# Patient Record
Sex: Female | Born: 1980 | Race: White | Hispanic: No | Marital: Married
Health system: Southern US, Community
[De-identification: ages and names within clinical notes are randomized; demographics above are authoritative.]

## PROBLEM LIST (undated history)

## (undated) DIAGNOSIS — I10 Essential (primary) hypertension: Secondary | ICD-10-CM

## (undated) DIAGNOSIS — R51 Headache: Secondary | ICD-10-CM

## (undated) DIAGNOSIS — N471 Phimosis: Secondary | ICD-10-CM

## (undated) DIAGNOSIS — F419 Anxiety disorder, unspecified: Secondary | ICD-10-CM

## (undated) DIAGNOSIS — Z8759 Personal history of other complications of pregnancy, childbirth and the puerperium: Secondary | ICD-10-CM

## (undated) DIAGNOSIS — G932 Benign intracranial hypertension: Secondary | ICD-10-CM

## (undated) DIAGNOSIS — N979 Female infertility, unspecified: Secondary | ICD-10-CM

## (undated) DIAGNOSIS — G8929 Other chronic pain: Secondary | ICD-10-CM

## (undated) DIAGNOSIS — F17200 Nicotine dependence, unspecified, uncomplicated: Secondary | ICD-10-CM

## (undated) DIAGNOSIS — R519 Headache, unspecified: Secondary | ICD-10-CM

## (undated) DIAGNOSIS — F32A Depression, unspecified: Secondary | ICD-10-CM

## (undated) DIAGNOSIS — Z87442 Personal history of urinary calculi: Secondary | ICD-10-CM

## (undated) DIAGNOSIS — N838 Other noninflammatory disorders of ovary, fallopian tube and broad ligament: Secondary | ICD-10-CM

## (undated) DIAGNOSIS — G935 Compression of brain: Secondary | ICD-10-CM

## (undated) DIAGNOSIS — N736 Female pelvic peritoneal adhesions (postinfective): Secondary | ICD-10-CM

## (undated) DIAGNOSIS — Z8742 Personal history of other diseases of the female genital tract: Secondary | ICD-10-CM

## (undated) DIAGNOSIS — N848 Polyp of other parts of female genital tract: Secondary | ICD-10-CM

## (undated) DIAGNOSIS — N809 Endometriosis, unspecified: Secondary | ICD-10-CM

## (undated) HISTORY — PX: OTHER SURGICAL HISTORY: SHX169

## (undated) HISTORY — PX: SPINAL PUNCTURE LUMBAR DIAG (ARMC HX): HXRAD1265

## (undated) HISTORY — PX: LAPAROSCOPY: SHX197

## (undated) HISTORY — PX: TONSILLECTOMY: SUR1361

## (undated) HISTORY — PX: CHOLECYSTECTOMY: SHX55

## (undated) HISTORY — PX: HYSTEROSCOPY: SHX211

---
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
## 2004-12-04 ENCOUNTER — Emergency Department: Payer: Self-pay | Admitting: Emergency Medicine

## 2004-12-16 ENCOUNTER — Ambulatory Visit: Payer: Self-pay | Admitting: Unknown Physician Specialty

## 2005-07-12 ENCOUNTER — Observation Stay: Payer: Self-pay | Admitting: Unknown Physician Specialty

## 2005-08-03 ENCOUNTER — Inpatient Hospital Stay: Payer: Self-pay

## 2007-04-21 ENCOUNTER — Emergency Department: Payer: Self-pay | Admitting: Unknown Physician Specialty

## 2007-05-11 ENCOUNTER — Ambulatory Visit: Payer: Self-pay | Admitting: Urology

## 2007-06-11 ENCOUNTER — Ambulatory Visit: Payer: Self-pay | Admitting: Urology

## 2007-09-30 ENCOUNTER — Emergency Department (HOSPITAL_COMMUNITY): Admission: EM | Admit: 2007-09-30 | Discharge: 2007-09-30 | Payer: Self-pay | Admitting: Emergency Medicine

## 2007-10-01 ENCOUNTER — Ambulatory Visit: Payer: Self-pay | Admitting: Urology

## 2009-03-06 IMAGING — CT CT STONE STUDY
1 of 2 series · 16 of 32 positions shown, 20 images · non-contrast
Comparison: none

REASON FOR EXAM: hematuria
COMMENTS:

PROCEDURE:     CT  - CT ABDOMEN /PELVIS WO (STONE)  - April 21, 2007  [DATE]
RESULT:
HISTORY: Hematuria.

[Series 2: stone · axial · 0.60mm/px · z∈[-466,-67]mm · 16 of 145 slices shown, 20 images]
[im 6/145  soft-tissue]
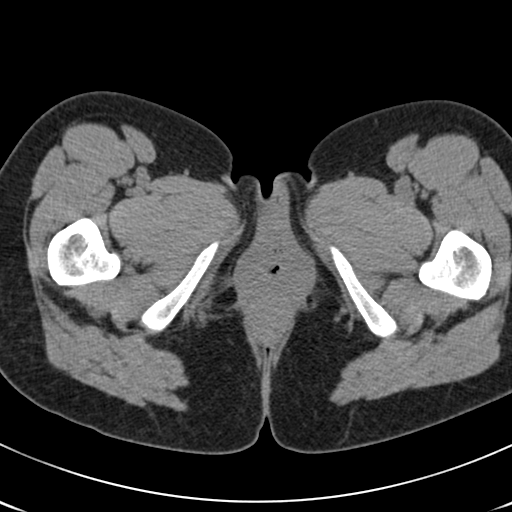
[im 6/145  bone]
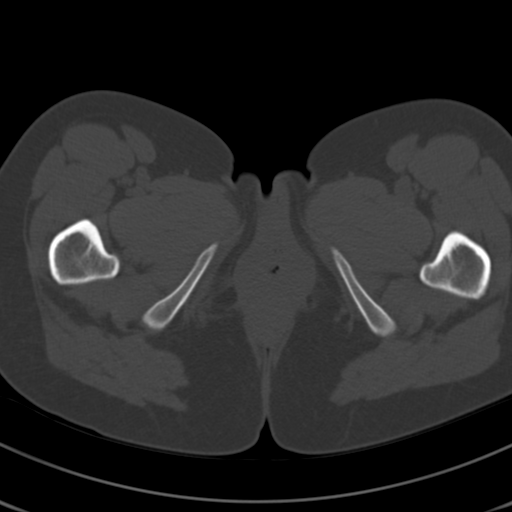
[im 16/145  soft-tissue]
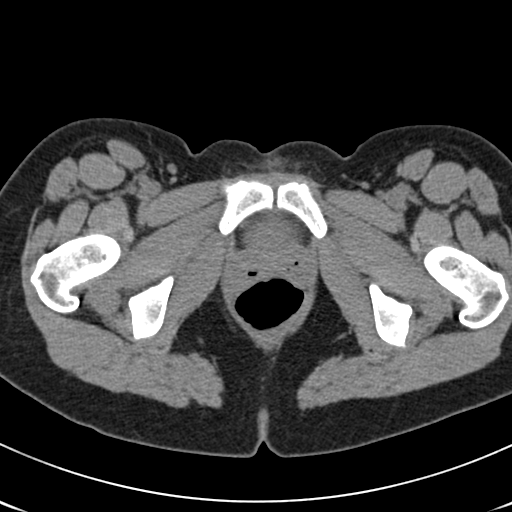
[im 26/145  soft-tissue]
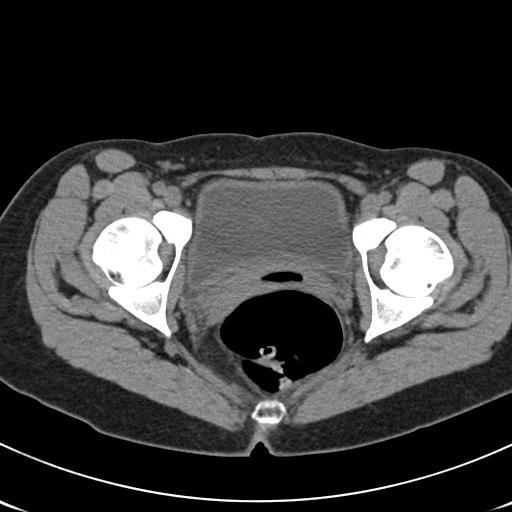
[im 37/145  soft-tissue]
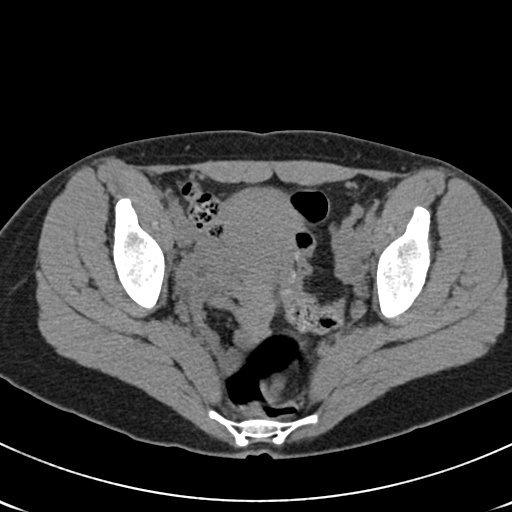
[im 47/145  soft-tissue]
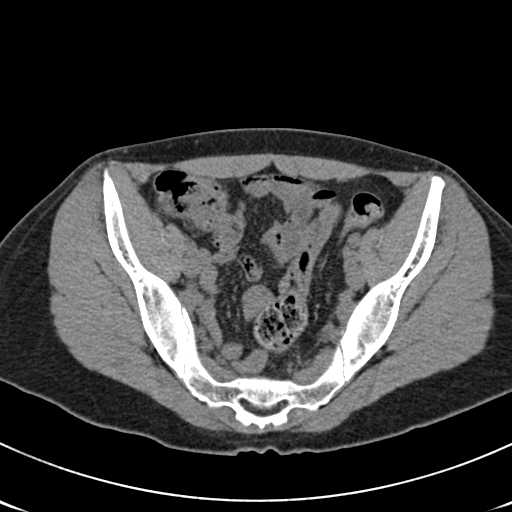
[im 57/145  soft-tissue]
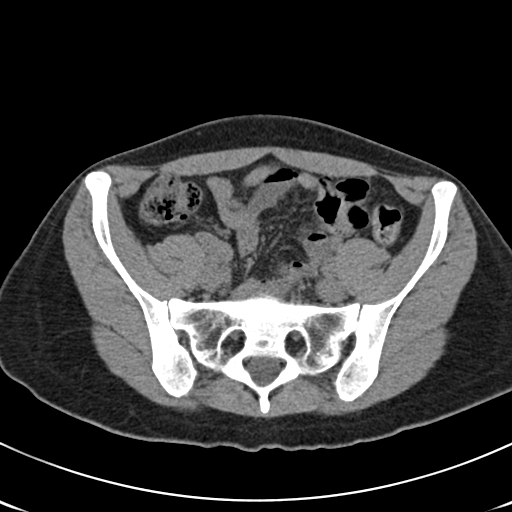
[im 67/145  soft-tissue]
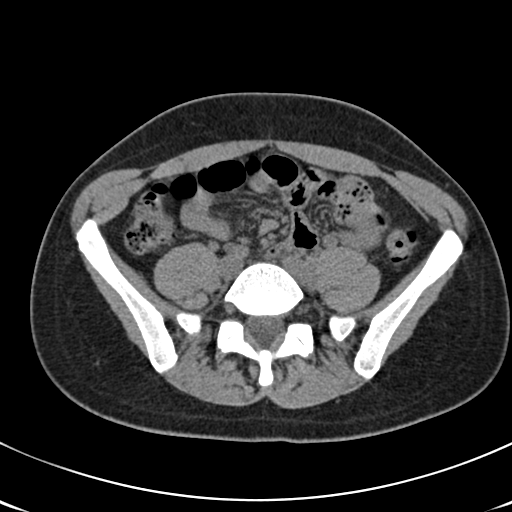
[im 78/145  soft-tissue]
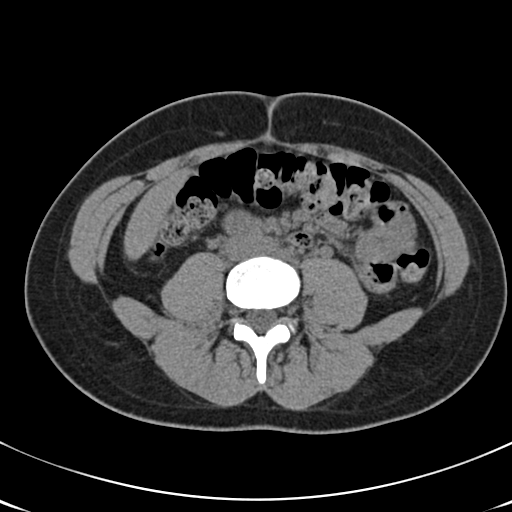
[im 88/145  soft-tissue]
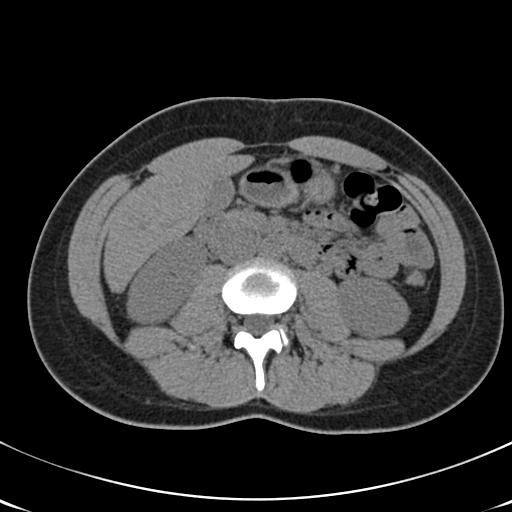
[im 88/145  bone]
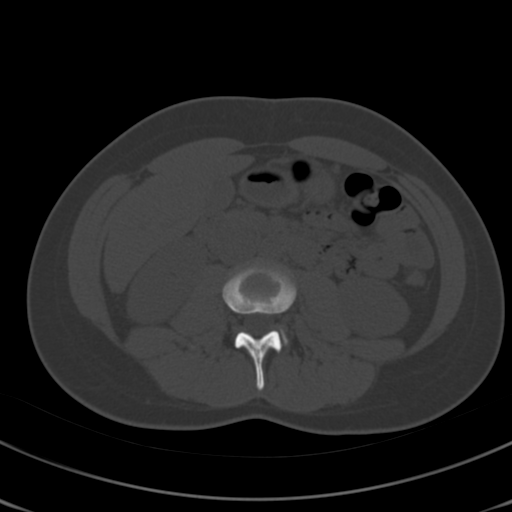
[im 98/145  soft-tissue]
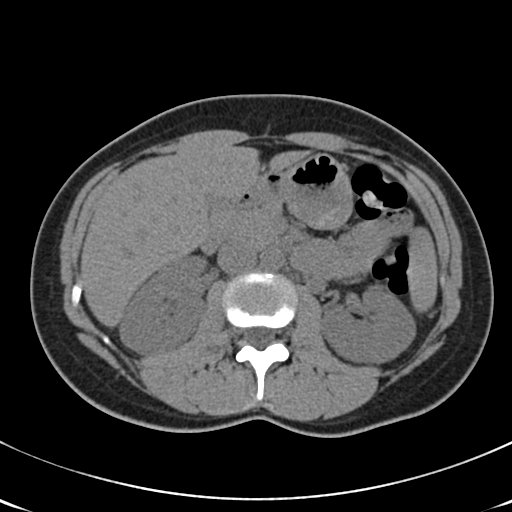
[im 109/145  soft-tissue]
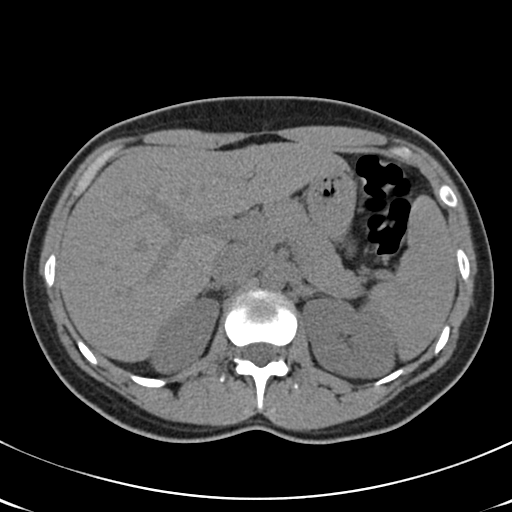
[im 119/145  soft-tissue]
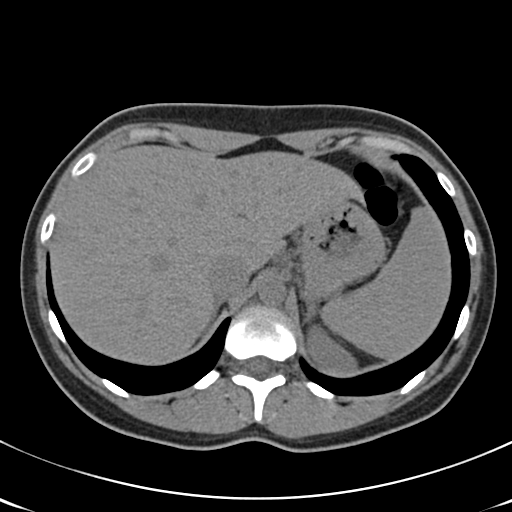
[im 124/145  lung]
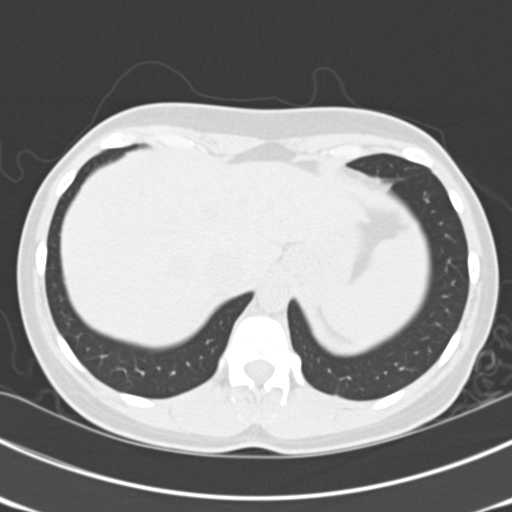
[im 129/145  soft-tissue]
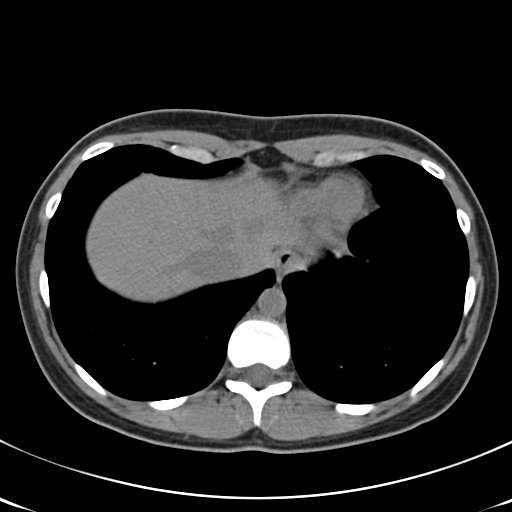
[im 129/145  lung]
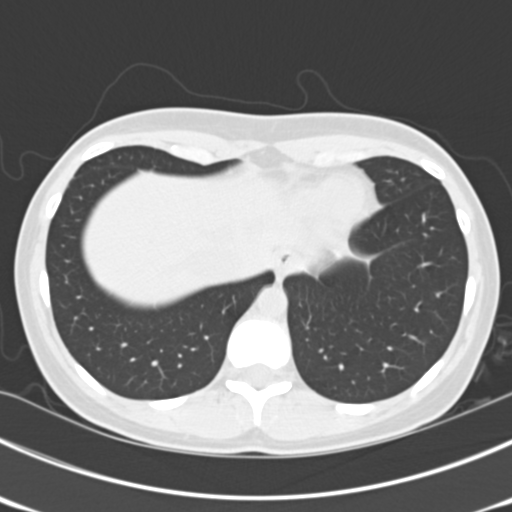
[im 134/145  lung]
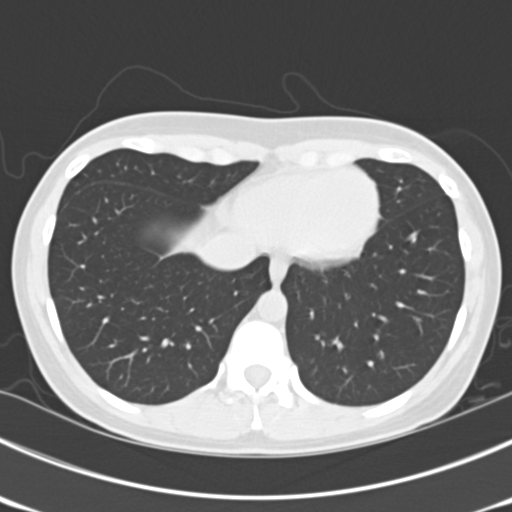
[im 139/145  soft-tissue]
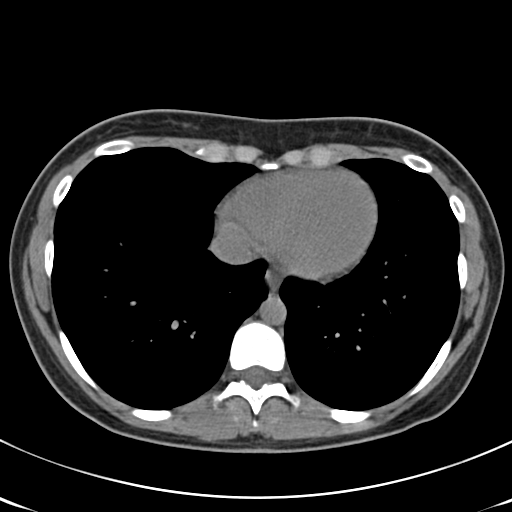
[im 139/145  lung]
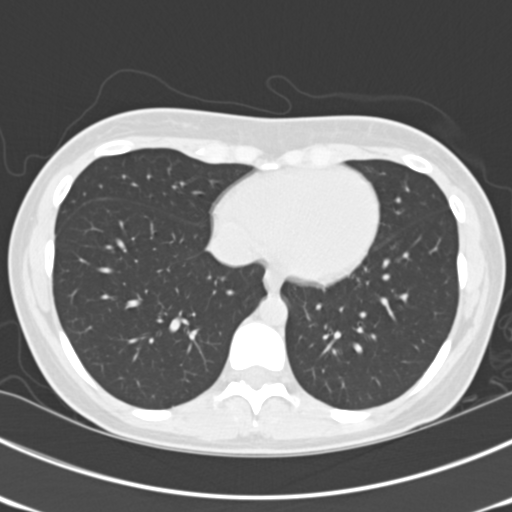

[16 of 32 positions shown; findings below may reference images not displayed]

COMPARISON STUDIES: No recent.

PROCEDURE AND FINDINGS: Non-enhanced CT of the abdomen and pelvis is
obtained. The liver and spleen are normal.  The pancreas is normal. The
adrenals are normal.  No focal renal abnormalities are identified. There is
no bowel distention.  An intrauterine device is noted.  The bladder is
non-distended.  There is a tiny 3 to 4 mm calcification in the LEFT pelvis.
This could represent a very tiny distal LEFT ureteral stone.  There is no
evidence of severe hydronephrosis.
IMPRESSION: 1)Cannot exclude a tiny 3 mm distal LEFT ureteral stone.

## 2009-06-19 ENCOUNTER — Emergency Department: Payer: Self-pay | Admitting: Emergency Medicine

## 2011-05-06 LAB — URINALYSIS, ROUTINE W REFLEX MICROSCOPIC
Nitrite: POSITIVE — AB
Specific Gravity, Urine: 1.005
Urobilinogen, UA: 0.2

## 2011-05-06 LAB — URINE MICROSCOPIC-ADD ON

## 2011-08-16 HISTORY — PX: CRANIECTOMY SUBOCCIPITAL W/ CERVICAL LAMINECTOMY / CHIARI: SUR327

## 2011-11-01 ENCOUNTER — Emergency Department: Payer: Self-pay | Admitting: Emergency Medicine

## 2011-11-08 ENCOUNTER — Ambulatory Visit: Payer: Self-pay | Admitting: Neurology

## 2011-12-26 ENCOUNTER — Ambulatory Visit: Payer: Self-pay | Admitting: Neurology

## 2011-12-26 LAB — HCG, QUANTITATIVE, PREGNANCY: Beta Hcg, Quant.: <1 m[IU]/mL — ABNORMAL LOW

## 2012-06-07 HISTORY — PX: CRANIECTOMY SUBOCCIPITAL W/ CERVICAL LAMINECTOMY / CHIARI: SUR327

## 2015-06-17 ENCOUNTER — Emergency Department
Admission: EM | Admit: 2015-06-17 | Discharge: 2015-06-17 | Disposition: A | Discharge status: Home / Self Care | Payer: Self-pay | Attending: Emergency Medicine | Admitting: Emergency Medicine

## 2015-06-17 ENCOUNTER — Encounter: Payer: Self-pay | Admitting: Emergency Medicine

## 2015-06-17 ENCOUNTER — Emergency Department: Payer: Self-pay

## 2015-06-17 DIAGNOSIS — R079 Chest pain, unspecified: Secondary | ICD-10-CM | POA: Insufficient documentation

## 2015-06-17 DIAGNOSIS — F419 Anxiety disorder, unspecified: Secondary | ICD-10-CM | POA: Insufficient documentation

## 2015-06-17 DIAGNOSIS — Z720 Tobacco use: Secondary | ICD-10-CM | POA: Insufficient documentation

## 2015-06-17 DIAGNOSIS — R0602 Shortness of breath: Secondary | ICD-10-CM | POA: Insufficient documentation

## 2015-06-17 LAB — BASIC METABOLIC PANEL
Anion gap: 6 (ref 5–15)
BUN: 7 mg/dL (ref 6–20)
CALCIUM: 9 mg/dL (ref 8.9–10.3)
CHLORIDE: 105 mmol/L (ref 101–111)
CO2: 25 mmol/L (ref 22–32)
CREATININE: 0.68 mg/dL (ref 0.44–1.00)
GFR calc non Af Amer: >60 mL/min (ref >60)
Glucose, Bld: 92 mg/dL (ref 65–99)
Potassium: 3.5 mmol/L (ref 3.5–5.1)
SODIUM: 136 mmol/L (ref 135–145)

## 2015-06-17 LAB — TROPONIN I

## 2015-06-17 LAB — CBC
HCT: 42.9 % (ref 35.0–47.0)
Hemoglobin: 14.7 g/dL (ref 12.0–16.0)
MCH: 31.9 pg (ref 26.0–34.0)
MCHC: 34.4 g/dL (ref 32.0–36.0)
MCV: 92.8 fL (ref 80.0–100.0)
PLATELETS: 210 10*3/uL (ref 150–440)
RBC: 4.62 MIL/uL (ref 3.80–5.20)
RDW: 12.9 % (ref 11.5–14.5)
WBC: 6.3 10*3/uL (ref 3.6–11.0)

## 2015-06-17 MED ORDER — LISINOPRIL 10 MG PO TABS: 10.0000 mg | ORAL_TABLET | Freq: Every day | ORAL | Status: DC

## 2015-06-17 MED ORDER — ACETAZOLAMIDE 125 MG PO TABS
250.0000 mg | ORAL_TABLET | Freq: Two times a day (BID) | ORAL | Status: DC
Start: 2015-06-17 — End: 2016-06-02

## 2015-06-17 MED ORDER — LORAZEPAM 0.5 MG PO TABS: 0.5000 mg | ORAL_TABLET | Freq: Three times a day (TID) | ORAL | Status: AC | PRN

## 2015-06-17 MED ORDER — POTASSIUM CHLORIDE ER 10 MEQ PO TBCR: 10.0000 meq | EXTENDED_RELEASE_TABLET | Freq: Two times a day (BID) | ORAL | Status: DC

## 2015-06-17 NOTE — ED Provider Notes (Signed)
[facility] Emergency Department Provider Note  Time seen: 1:18 PM  I have reviewed the triage vital signs and the nursing notes.   HISTORY  Chief Complaint Chest Pain    HPI Olivia Melendez is a 34 y.o. female with a past medical history of Chiari malformation, increased intracranial pressure, who presents to the emergency department with chest pain. According to the patient she has had chest pain on and off since this weekend, last episode was around 8:00 this morning. Denies any chest pain currently. She states the chest pain seems to happening during times of increased stress or anxiety. States some shortness of breath with her chest pain, denies any currently. Denies any nausea or diaphoresis. Describes the chest pain is mild to moderate at its maximum which she describes as a tightness feeling and she can feel her heart beating fast again her chest.Also states she is out of all of her prescription medications for the past 2 months due to insurance issues, but she has just received Medicaid 2 days ago.    History reviewed. No pertinent past medical history.  There are no active problems to display for this patient.   History reviewed. No pertinent past surgical history.  No current outpatient prescriptions on file.  Allergies Review of patient's allergies indicates no known allergies.  No family history on file.  Social History Social History  Substance Use Topics  . Smoking status: Current Every Day Smoker -- 1.00 packs/day  . Smokeless tobacco: None  . Alcohol Use: Yes     Comment: socially    Review of Systems Constitutional: Negative for fever. Cardiovascular: Positive for intermittent chest pain Respiratory: Negative for shortness of breath. Gastrointestinal: Negative for abdominal pain Musculoskeletal: Negative for back pain. Neurological: Negative for headache 10-point ROS otherwise  negative.  ____________________________________________   PHYSICAL EXAM:  VITAL SIGNS: ED Triage Vitals  Enc Vitals Group     BP 06/17/15 1016 154/99 mmHg     Pulse Rate 06/17/15 1016 89     Resp 06/17/15 1016 18     Temp 06/17/15 1016 98 F (36.7 C)     Temp Source 06/17/15 1016 Oral     SpO2 06/17/15 1016 100 %     Weight 06/17/15 1016 155 lb (70.308 kg)     Height 06/17/15 1016 5\' 4"  (1.626 m)     Head Cir --      Peak Flow --      Pain Score 06/17/15 1018 8     Pain Loc --      Pain Edu? --      Excl. in GC? --    Constitutional: Alert and oriented. Well appearing and in no distress. Eyes: Normal exam ENT   Head: Normocephalic and atraumatic   Mouth/Throat: Mucous membranes are moist. Cardiovascular: Normal rate, regular rhythm. No murmur Respiratory: Normal respiratory effort without tachypnea nor retractions. Breath sounds are clear and equal bilaterally. No wheezes/rales/rhonchi. Gastrointestinal: Soft and nontender. No distention.   Musculoskeletal: Nontender with normal range of motion in all extremities. Neurologic:  Normal speech and language. No gross focal neurologic deficits Skin:  Skin is warm, dry and intact.  Psychiatric: Mood and affect are normal. Speech and behavior are normal.   ____________________________________________    EKG  EKG reviewed and interpreted by myself shows normal sinus rhythm at 77 bpm, narrow QRS, normal axis, normal intervals, no ST changes noted. Overall normal EKG.  ____________________________________________    RADIOLOGY  Chest x-ray shows no  acute abnormalities.  ____________________________________________    INITIAL IMPRESSION / ASSESSMENT AND PLAN / ED COURSE  Pertinent labs & imaging results that were available during my care of the patient were reviewed by me and considered in my medical decision making (see chart for details).  Patient with intermittent chest pain for several days. States the  chest pain only occurs during times of stress/anxiety. Denies any symptoms currently. I discussed with patient a short trial of Ativan. She has a follow-up appointment with her primary care doctor tomorrow. I will also refill her lisinopril, Acetasol minute and potassium supplement. I discussed with the patient strict return precautions for worsening chest pain any trouble breathing, or new symptoms concerning to her self. She is agreeable.  ____________________________________________   FINAL CLINICAL IMPRESSION(S) / ED DIAGNOSES  Chest pain Anxiety   Minna Antis, MD 06/17/15 1321

## 2015-06-17 NOTE — Discharge Instructions (Signed)
You have been seen in the emergency department today for chest pain. Your workup has shown normal results. As we discussed please follow-up with your primary care physician in the next 1-2 days for recheck. Return to the emergency department for any further chest pain, trouble breathing, or any other symptom personally concerning to yourself.   Nonspecific Chest Pain  Chest pain can be caused by many different conditions. There is always a chance that your pain could be related to something serious, such as a heart attack or a blood clot in your lungs. Chest pain can also be caused by conditions that are not life-threatening. If you have chest pain, it is very important to follow up with your health care provider. CAUSES  Chest pain can be caused by:  Heartburn.  Pneumonia or bronchitis.  Anxiety or stress.  Inflammation around your heart (pericarditis) or lung (pleuritis or pleurisy).  A blood clot in your lung.  A collapsed lung (pneumothorax). It can develop suddenly on its own (spontaneous pneumothorax) or from trauma to the chest.  Shingles infection (varicella-zoster virus).  Heart attack.  Damage to the bones, muscles, and cartilage that make up your chest wall. This can include:  Bruised bones due to injury.  Strained muscles or cartilage due to frequent or repeated coughing or overwork.  Fracture to one or more ribs.  Sore cartilage due to inflammation (costochondritis). RISK FACTORS  Risk factors for chest pain may include:  Activities that increase your risk for trauma or injury to your chest.  Respiratory infections or conditions that cause frequent coughing.  Medical conditions or overeating that can cause heartburn.  Heart disease or family history of heart disease.  Conditions or health behaviors that increase your risk of developing a blood clot.  Having had chicken pox (varicella zoster). SIGNS AND SYMPTOMS Chest pain can feel like:  Burning or  tingling on the surface of your chest or deep in your chest.  Crushing, pressure, aching, or squeezing pain.  Dull or sharp pain that is worse when you move, cough, or take a deep breath.  Pain that is also felt in your back, neck, shoulder, or arm, or pain that spreads to any of these areas. Your chest pain may come and go, or it may stay constant. DIAGNOSIS Lab tests or other studies may be needed to find the cause of your pain. Your health care provider may have you take a test called an ambulatory ECG (electrocardiogram). An ECG records your heartbeat patterns at the time the test is performed. You may also have other tests, such as:  Transthoracic echocardiogram (TTE). During echocardiography, sound waves are used to create a picture of all of the heart structures and to look at how blood flows through your heart.  Transesophageal echocardiogram (TEE).This is a more advanced imaging test that obtains images from inside your body. It allows your health care provider to see your heart in finer detail.  Cardiac monitoring. This allows your health care provider to monitor your heart rate and rhythm in real time.  Holter monitor. This is a portable device that records your heartbeat and can help to diagnose abnormal heartbeats. It allows your health care provider to track your heart activity for several days, if needed.  Stress tests. These can be done through exercise or by taking medicine that makes your heart beat more quickly.  Blood tests.  Imaging tests. TREATMENT  Your treatment depends on what is causing your chest pain. Treatment may include:  Medicines. These may include: °¨ Acid blockers for heartburn. °¨ Anti-inflammatory medicine. °¨ Pain medicine for inflammatory conditions. °¨ Antibiotic medicine, if an infection is present. °¨ Medicines to dissolve blood clots. °¨ Medicines to treat coronary artery disease. °· Supportive care for conditions that do not require medicines.  This may include: °¨ Resting. °¨ Applying heat or cold packs to injured areas. °¨ Limiting activities until pain decreases. °HOME CARE INSTRUCTIONS °· If you were prescribed an antibiotic medicine, finish it all even if you start to feel better. °· Avoid any activities that bring on chest pain. °· Do not use any tobacco products, including cigarettes, chewing tobacco, or electronic cigarettes. If you need help quitting, ask your health care provider. °· Do not drink alcohol. °· Take medicines only as directed by your health care provider. °· Keep all follow-up visits as directed by your health care provider. This is important. This includes any further testing if your chest pain does not go away. °· If heartburn is the cause for your chest pain, you may be told to keep your head raised (elevated) while sleeping. This reduces the chance that acid will go from your stomach into your esophagus. °· Make lifestyle changes as directed by your health care provider. These may include: °¨ Getting regular exercise. Ask your health care provider to suggest some activities that are safe for you. °¨ Eating a heart-healthy diet. A registered dietitian can help you to learn healthy eating options. °¨ Maintaining a healthy weight. °¨ Managing diabetes, if necessary. °¨ Reducing stress. °SEEK MEDICAL CARE IF: °· Your chest pain does not go away after treatment. °· You have a rash with blisters on your chest. °· You have a fever. °SEEK IMMEDIATE MEDICAL CARE IF:  °· Your chest pain is worse. °· You have an increasing cough, or you cough up blood. °· You have severe abdominal pain. °· You have severe weakness. °· You faint. °· You have chills. °· You have sudden, unexplained chest discomfort. °· You have sudden, unexplained discomfort in your arms, back, neck, or jaw. °· You have shortness of breath at any time. °· You suddenly start to sweat, or your skin gets clammy. °· You feel nauseous or you vomit. °· You suddenly feel  light-headed or dizzy. °· Your heart begins to beat quickly, or it feels like it is skipping beats. °These symptoms may represent a serious problem that is an emergency. Do not wait to see if the symptoms will go away. Get medical help right away. Call your local emergency services (911 in the [place] Do not drive yourself to the hospital. °  °This information is not intended to replace advice given to you by your health care provider. Make sure you discuss any questions you have with your health care provider. °  °Document Released: 05/11/2005 Document Revised: 08/22/2014 Document Reviewed: 03/07/2014 °Elsevier Interactive Patient Education ©2016 [employer] ° °

## 2015-06-17 NOTE — ED Notes (Signed)
Patient arrived to triage desk at [phone], brought to room 3 for EKG at 1000. [facility] started on paper at 1002. Patient complaining of chest pain and headache starting this AM. States she has 2 brain disorders "chiari malformation" and idiopathic intracranial hypertension. Denies visual changes.  Complains of "light headedness", denies N&V.

## 2015-06-17 NOTE — ED Notes (Signed)
Unable to get patient to sign, sig pad unresponsive

## 2015-06-17 NOTE — ED Notes (Signed)
Vital signs stable. 

## 2016-03-28 ENCOUNTER — Inpatient Hospital Stay (HOSPITAL_COMMUNITY)
Admission: AD | Admit: 2016-03-28 | Discharge: 2016-03-28 | Disposition: A | Discharge status: Home / Self Care | Payer: 59 | Source: Ambulatory Visit | Attending: Obstetrics and Gynecology | Admitting: Obstetrics and Gynecology

## 2016-03-28 ENCOUNTER — Encounter (HOSPITAL_COMMUNITY): Payer: Self-pay | Admitting: *Deleted

## 2016-03-28 DIAGNOSIS — O008 Other ectopic pregnancy without intrauterine pregnancy: Secondary | ICD-10-CM | POA: Diagnosis not present

## 2016-03-28 DIAGNOSIS — Z3A00 Weeks of gestation of pregnancy not specified: Secondary | ICD-10-CM | POA: Insufficient documentation

## 2016-03-28 LAB — COMPREHENSIVE METABOLIC PANEL
ALT: 16 U/L (ref 14–54)
ANION GAP: 8 (ref 5–15)
AST: 22 U/L (ref 15–41)
Albumin: 4.4 g/dL (ref 3.5–5.0)
Alkaline Phosphatase: 73 U/L (ref 38–126)
BUN: 7 mg/dL (ref 6–20)
CHLORIDE: 104 mmol/L (ref 101–111)
CO2: 26 mmol/L (ref 22–32)
Calcium: 8.9 mg/dL (ref 8.9–10.3)
Creatinine, Ser: 0.58 mg/dL (ref 0.44–1.00)
Glucose, Bld: 89 mg/dL (ref 65–99)
POTASSIUM: 3.8 mmol/L (ref 3.5–5.1)
Sodium: 138 mmol/L (ref 135–145)
Total Bilirubin: 0.5 mg/dL (ref 0.3–1.2)
Total Protein: 7.7 g/dL (ref 6.5–8.1)

## 2016-03-28 LAB — CBC
HCT: 39 % (ref 36.0–46.0)
Hemoglobin: 13.5 g/dL (ref 12.0–15.0)
MCH: 31.6 pg (ref 26.0–34.0)
MCHC: 34.6 g/dL (ref 30.0–36.0)
MCV: 91.3 fL (ref 78.0–100.0)
PLATELETS: 246 10*3/uL (ref 150–400)
RBC: 4.27 MIL/uL (ref 3.87–5.11)
RDW: 12.5 % (ref 11.5–15.5)
WBC: 8.5 10*3/uL (ref 4.0–10.5)

## 2016-03-28 LAB — HCG, QUANTITATIVE, PREGNANCY: HCG, BETA CHAIN, QUANT, S: 777 m[IU]/mL — AB (ref <5)

## 2016-03-28 MED ORDER — METHOTREXATE INJECTION FOR WOMEN'S HOSPITAL
50.0000 mg/m2 | Freq: Once | INTRAMUSCULAR | Status: AC
  Administered 2016-03-28: 95 mg via INTRAMUSCULAR
  Filled 2016-03-28: qty 1.9

## 2016-03-28 NOTE — Discharge Instructions (Signed)
Methotrexate injection What is this medicine? METHOTREXATE (METH oh TREX ate) is a chemotherapy drug used to treat cancer including breast cancer, leukemia, and lymphoma. This medicine can also be used to treat psoriasis and certain kinds of arthritis. This medicine may be used for other purposes; ask your health care provider or pharmacist if you have questions. What should I tell my health care provider before I take this medicine? They need to know if you have any of these conditions: -fluid in the stomach area or lungs -if you often drink alcohol -infection or immune system problems -kidney disease -liver disease -low blood counts, like low white cell, platelet, or red cell counts -lung disease -radiation therapy -stomach ulcers -ulcerative colitis -an unusual or allergic reaction to methotrexate, other medicines, foods, dyes, or preservatives -pregnant or trying to get pregnant -breast-feeding How should I use this medicine? This medicine is for infusion into a vein or for injection into muscle or into the spinal fluid (whichever applies). It is usually given by a health care professional in a hospital or clinic setting. In rare cases, you might get this medicine at home. You will be taught how to give this medicine. Use exactly as directed. Take your medicine at regular intervals. Do not take your medicine more often than directed. If this medicine is used for arthritis or psoriasis, it should be taken weekly, NOT daily. It is important that you put your used needles and syringes in a special sharps container. Do not put them in a trash can. If you do not have a sharps container, call your pharmacist or healthcare provider to get one. Talk to your pediatrician regarding the use of this medicine in children. While this drug may be prescribed for children as young as 2 years for selected conditions, precautions do apply. Overdosage: If you think you have taken too much of this medicine  contact a poison control center or emergency room at once. NOTE: This medicine is only for you. Do not share this medicine with others. What if I miss a dose? It is important not to miss your dose. Call your doctor or health care professional if you are unable to keep an appointment. If you give yourself the medicine and you miss a dose, talk with your doctor or health care professional. Do not take double or extra doses. What may interact with this medicine? This medicine may interact with the following medications: -acitretin -aspirin or aspirin-like medicines including salicylates -azathioprine -certain antibiotics like chloramphenicol, penicillin, tetracycline -certain medicines for stomach problems like esomeprazole, omeprazole, pantoprazole -cyclosporine -gold -hydroxychloroquine -live virus vaccines -mercaptopurine -NSAIDs, medicines for pain and inflammation, like ibuprofen or naproxen -other cytotoxic agents -penicillamine -phenylbutazone -phenytoin -probenacid -retinoids such as isotretinoin and tretinoin -steroid medicines like prednisone or cortisone -sulfonamides like sulfasalazine and trimethoprim/sulfamethoxazole -theophylline This list may not describe all possible interactions. Give your health care provider a list of all the medicines, herbs, non-prescription drugs, or dietary supplements you use. Also tell them if you smoke, drink alcohol, or use illegal drugs. Some items may interact with your medicine. What should I watch for while using this medicine? Avoid alcoholic drinks. In some cases, you may be given additional medicines to help with side effects. Follow all directions for their use. This medicine can make you more sensitive to the sun. Keep out of the sun. If you cannot avoid being in the sun, wear protective clothing and use sunscreen. Do not use sun lamps or tanning beds/booths. You may get drowsy   or dizzy. Do not drive, use machinery, or do anything that  needs mental alertness until you know how this medicine affects you. Do not stand or sit up quickly, especially if you are an older patient. This reduces the risk of dizzy or fainting spells. You may need blood work done while you are taking this medicine. Call your doctor or health care professional for advice if you get a fever, chills or sore throat, or other symptoms of a cold or flu. Do not treat yourself. This drug decreases your body's ability to fight infections. Try to avoid being around people who are sick. This medicine may increase your risk to bruise or bleed. Call your doctor or health care professional if you notice any unusual bleeding. Check with your doctor or health care professional if you get an attack of severe diarrhea, nausea and vomiting, or if you sweat a lot. The loss of too much body fluid can make it dangerous for you to take this medicine. Talk to your doctor about your risk of cancer. You may be more at risk for certain types of cancers if you take this medicine. Both men and women must use effective birth control with this medicine. Do not become pregnant while taking this medicine or until at least 1 normal menstrual cycle has occurred after stopping it. Women should inform their doctor if they wish to become pregnant or think they might be pregnant. Men should not father a child while taking this medicine and for 3 months after stopping it. There is a potential for serious side effects to an unborn child. Talk to your health care professional or pharmacist for more information. Do not breast-feed an infant while taking this medicine. What side effects may I notice from receiving this medicine? Side effects that you should report to your doctor or health care professional as soon as possible: -allergic reactions like skin rash, itching or hives, swelling of the face, lips, or tongue -back pain -breathing problems or shortness of breath -confusion -diarrhea -dry,  nonproductive cough -low blood counts - this medicine may decrease the number of white blood cells, red blood cells and platelets. You may be at increased risk of infections and bleeding -mouth sores -redness, blistering, peeling or loosening of the skin, including inside the mouth -seizures -severe headaches -signs of infection - fever or chills, cough, sore throat, pain or difficulty passing urine -signs and symptoms of bleeding such as bloody or black, tarry stools; red or dark-brown urine; spitting up blood or brown material that looks like coffee grounds; red spots on the skin; unusual bruising or bleeding from the eye, gums, or nose -signs and symptoms of kidney injury like trouble passing urine or change in the amount of urine -signs and symptoms of liver injury like dark yellow or brown urine; general ill feeling or flu-like symptoms; light-colored stools; loss of appetite; nausea; right upper belly pain; unusually weak or tired; yellowing of the eyes or skin -stiff neck -vomiting Side effects that usually do not require medical attention (report to your doctor or health care professional if they continue or are bothersome): -dizziness -hair loss -headache -stomach pain -upset stomach This list may not describe all possible side effects. Call your doctor for medical advice about side effects. You may report side effects to FDA at 1-800-FDA-1088. Where should I keep my medicine? If you are using this medicine at home, you will be instructed on how to store this medicine. Throw away any unused medicine after   the expiration date on the label. NOTE: This sheet is a summary. It may not cover all possible information. If you have questions about this medicine, talk to your doctor, pharmacist, or health care provider.    2016, Elsevier/[employer]. (2014-11-20 12:36:41)  

## 2016-03-28 NOTE — MAU Note (Signed)
Dr. Langston MaskerMorris called, need for methotrexate confirmed.  Labs completed.  Set to follow up in office in four days for repeat quant.

## 2016-03-28 NOTE — MAU Note (Signed)
Sent in for methotrexate injection.

## 2016-03-28 NOTE — MAU Note (Signed)
Urine sent to lab 

## 2016-05-02 ENCOUNTER — Encounter (HOSPITAL_COMMUNITY): Payer: Self-pay | Admitting: Emergency Medicine

## 2016-05-02 ENCOUNTER — Ambulatory Visit (HOSPITAL_COMMUNITY)
Admission: EM | Admit: 2016-05-02 | Discharge: 2016-05-02 | Disposition: A | Discharge status: Home / Self Care | Payer: 59 | Attending: Emergency Medicine | Admitting: Emergency Medicine

## 2016-05-02 DIAGNOSIS — R0789 Other chest pain: Secondary | ICD-10-CM

## 2016-05-02 DIAGNOSIS — R05 Cough: Secondary | ICD-10-CM

## 2016-05-02 DIAGNOSIS — R059 Cough, unspecified: Secondary | ICD-10-CM

## 2016-05-02 MED ORDER — GI COCKTAIL ~~LOC~~
30.0000 mL | Freq: Once | ORAL | Status: AC
Start: 2016-05-02 — End: 2016-05-02
  Administered 2016-05-02: 30 mL via ORAL

## 2016-05-02 MED ORDER — GI COCKTAIL ~~LOC~~
ORAL | Status: AC
  Filled 2016-05-02: qty 30

## 2016-05-02 MED ORDER — AEROCHAMBER PLUS MISC: 2 refills | Status: DC

## 2016-05-02 MED ORDER — NAPROXEN 500 MG PO TABS: 500.0000 mg | ORAL_TABLET | Freq: Two times a day (BID) | ORAL | 0 refills | Status: DC

## 2016-05-02 MED ORDER — ALBUTEROL SULFATE HFA 108 (90 BASE) MCG/ACT IN AERS: 1.0000 | INHALATION_SPRAY | Freq: Four times a day (QID) | RESPIRATORY_TRACT | 0 refills | Status: DC | PRN

## 2016-05-02 MED ORDER — HYDROCOD POLST-CPM POLST ER 10-8 MG/5ML PO SUER: 5.0000 mL | Freq: Two times a day (BID) | ORAL | 0 refills | Status: DC | PRN

## 2016-05-02 MED ORDER — BENZONATATE 200 MG PO CAPS: 200.0000 mg | ORAL_CAPSULE | Freq: Three times a day (TID) | ORAL | 0 refills | Status: DC | PRN

## 2016-05-02 NOTE — ED Triage Notes (Signed)
[facility] with a complaint of a cough and chest wall pain when coughing and taking a deep breath. The patient stated that this all started today.

## 2016-05-02 NOTE — ED Provider Notes (Signed)
HPI  SUBJECTIVE:  Olivia Melendez is a 34 y.o. female who presents with nonproductive cough starting this morning. She reports substernal chest pain described as soreness only after coughing with taking a deep breath in. She reports shortness of breath with the coughing. She also states that her throat feels "raw" due to all the coughing. Symptoms are worse with coughing, no alleviating factors. She tried Mucinex for this. Patient states that she did a lot of yard work the day prior to evaluation and did a lot of upper body movements/sting. She denies nausea, diaphoresis, radiation of this chest pain to her neck, arm, through to her back. There is no exertional positional component. It isn't associated with moving her arms or torso rotation. She denies dyspnea on exertion, water brash, GERD, belching. No trauma to her chest. No abdominal pain, syncope, calf pain or swelling. No nasal congestion, postnasal drip, fevers. She has taken antipyretic in the past 6-8 hours. She has never had symptoms like this before. She has past medical history of tobacco abuse, Budd-Chiari malformation, intracranial hypertension, hypertension, currently not on any medicines. She is not on lisinopril. She is on OCPs. No history of GERD, asthma, emphysema, COPD. No history of diabetes, PE, DVT, cancer, MI. Family history negative for MI. LMP: 9/4 through the eighth, denies possibility of being pregnant, states he do not need to check. PMD: [facility] at [facility].  History reviewed. No pertinent past medical history.  History reviewed. No pertinent surgical history.  History reviewed. No pertinent family history.  Social History  Substance Use Topics  . Smoking status: Current Every Day Smoker    Packs/day: 1.00  . Smokeless tobacco: Never Used  . Alcohol use Yes     Comment: socially    No current facility-administered medications for this encounter.   Current Outpatient Prescriptions:  .  acetaZOLAMIDE  (DIAMOX) 125 MG tablet, Take 2 tablets (250 mg total) by mouth 2 (two) times daily., Disp: 60 tablet, Rfl: 0 .  albuterol (PROVENTIL HFA;VENTOLIN HFA) 108 (90 Base) MCG/ACT inhaler, Inhale 1-2 puffs into the lungs every 6 (six) hours as needed for wheezing or shortness of breath., Disp: 1 Inhaler, Rfl: 0 .  benzonatate (TESSALON) 200 MG capsule, Take 1 capsule (200 mg total) by mouth 3 (three) times daily as needed for cough., Disp: 30 capsule, Rfl: 0 .  chlorpheniramine-HYDROcodone (TUSSIONEX PENNKINETIC ER) 10-8 MG/5ML SUER, Take 5 mLs by mouth every 12 (twelve) hours as needed for cough., Disp: 120 mL, Rfl: 0 .  LORazepam (ATIVAN) 0.5 MG tablet, Take 1 tablet (0.5 mg total) by mouth every 8 (eight) hours as needed for anxiety., Disp: 30 tablet, Rfl: 0 .  naproxen (NAPROSYN) 500 MG tablet, Take 1 tablet (500 mg total) by mouth 2 (two) times daily., Disp: 20 tablet, Rfl: 0 .  potassium chloride (K-DUR) 10 MEQ tablet, Take 1 tablet (10 mEq total) by mouth 2 (two) times daily., Disp: 60 tablet, Rfl: 0 .  Spacer/Aero-Holding Chambers (AEROCHAMBER PLUS) inhaler, Use as instructed, Disp: 1 each, Rfl: 2  No Known Allergies   ROS  As noted in HPI.   Physical Exam  BP 155/84 (BP Location: Left Arm)   Pulse 71   Temp 99.5 F (37.5 C) (Oral)   Resp 16   LMP 04/18/2016 (Exact Date)   SpO2 100%   Breastfeeding? Unknown   Constitutional: Well developed, well nourished, no acute distress Eyes: PERRL, EOMI, conjunctiva normal bilaterally HENT: Normocephalic, atraumatic,mucus membranes moist Respiratory: Good air movement, Clear  to auscultation bilaterally, no rales, no wheezing, no rhonchi Cardiovascular: Positive reproducible anterior chest wall tenderness along the costochondral and sternal junctions. Normal rate and rhythm, no murmurs, no gallops, no rubs GI:  nondistended skin: No rash, skin intact Musculoskeletal: No edema, no tenderness, no deformities calves symmetric,  nontender Neurologic: Alert & oriented x 3, CN II-XII grossly intact, no motor deficits, sensation grossly intact Psychiatric: Speech and behavior appropriate   ED Course   Medications  gi cocktail (Maalox,Lidocaine,Donnatal) (30 mLs Oral Given 05/02/16 1633)    No orders of the defined types were placed in this encounter.  No results found for this or any previous visit (from the past 24 hour(s)). No results found.  ED Clinical Impression  Cough  Chest wall pain   ED Assessment/Plan  Doubt PE, ACS. Presentation most suggestive of musculoskeletal chest wall pain, especially as she did a lot of outside work yesterday. She is coughing here, so gave GI cocktail to dampen cough reflex with improvement in her symptoms. Pt declined a breathing treatment. Lungs are clear, satting 100% on room air. GERD, reactive airways causing cough also in the differential. Plan to send home with Naprosyn 500 mg twice a day for 10 days, cough syrup, tessalon perles, albuterol inhaler with spacer as she is a smoker. We'll provide primary care referral as [place] otherwise [facility]   Discussed MDM, plan and followup with patient. Discussed sn/sx that should prompt return to the ED. Patient agrees with plan.   *This clinic note was created using Dragon dictation software. Therefore, there may be occasional mistakes despite careful proofreading.  ?   Domenick GongAshley Hildreth Robart, MD 05/04/16 [phone]

## 2016-06-01 ENCOUNTER — Encounter (HOSPITAL_BASED_OUTPATIENT_CLINIC_OR_DEPARTMENT_OTHER): Payer: Self-pay | Admitting: *Deleted

## 2016-06-02 ENCOUNTER — Encounter (HOSPITAL_BASED_OUTPATIENT_CLINIC_OR_DEPARTMENT_OTHER): Payer: Self-pay | Admitting: *Deleted

## 2016-06-02 NOTE — Progress Notes (Addendum)
NPO AFTER MN.  ARRIVE AT 0600.  NEEDS ISTAT AND URINE PREG.  MAY TAKE FIORICET IF NEEDED AM DOS W/ SIPS OF WATER.   ADDENDUM:  REVIEWED CHART W/ DR Sampson GoonFITZGERALD, ROBERT MDA.  STATED PT TO BE ASSESSED DOS FOR BLOOD PRESSURE SINCE DOES NOT TAKE MEDICATION, OTHERWISE OK TO PROCEED.

## 2016-06-05 NOTE — H&P (Signed)
Olivia CostaStephanie Graham is an 35 y.o. female with know h/o endometriosis with two previous laparoscopies to remove lesions.  She recently had an ectopic pregnancy and underwent MTX tx.  She does desire future fertility but she has recently had worsened symptoms of endometriosis (dysmenorrhea and dyspareunia with increasing intensity) since being off contraception.  She presents today for laparoscopy.  Pertinent Gynecological History: Menses: flow is moderate and with severe dysmenorrhea Bleeding: regular Contraception: OCP (estrogen/progesterone) DES exposure: unknown Blood transfusions: none Sexually transmitted diseases: HSV Previous GYN Procedures: L/S x 2  Last mammogram: n/a Date: n/a Last pap: normal Date: 09/2015 OB History: G2, P1011   Menstrual History: Menarche age: n/a Patient's last menstrual period was 05/22/2016 (exact date).    Past Medical History:  Diagnosis Date  . Chiari malformation type I (HCC) [facility])   [facility]07[facility]  s/p  supocciptal crainectomy chiari decompression  . Chronic headaches    intermitant occiptal headaches , hx chiari malformation  . History of ectopic pregnancy    08/ 2017   treated w/ methotexrate  . History of endometriosis   . Hypertension    per pt has taken not medication, diamox, for past several months due to finances no insurance in between jobs  . IIH (idiopathic intracranial hypertension)    per pt dx 2014 --  approx. every 6 months gets [facility]  , last one 08-19-2015    Past Surgical History:  Procedure Laterality Date  . CRANIECTOMY SUBOCCIPITAL W/ CERVICAL LAMINECTOMY / CHIARI  06/07/2012   Decompression w/ resection posterior arch of C1 (chiari malformation Type 1)   . DX LAPAROSCOPY W/  ABLATION ENDOMETRIOSIS  2001 and 2006  . SPINAL PUNCTURE LUMBAR DIAG ([facility] HX)  [facility]2015   for chiari occiptal headaches  . TONSILLECTOMY  child  . WISDOM TOOTH  EXTRACTION  1994    History reviewed. No pertinent family history.  Social History:  reports that she has been smoking Cigarettes.  She has a 17.00 pack-year smoking history. She has never used smokeless tobacco. She reports that she drinks about 4.2 - 8.4 oz of alcohol per week . She reports that she does not use drugs.  Allergies: No Known Allergies  No prescriptions prior to admission.    ROS  Height 5' 3.5" (1.613 m), weight 163 lb (73.9 kg), last menstrual period 05/22/2016, not currently breastfeeding. Physical Exam  No results found for this or any previous visit (from the past 24 hour(s)).  No results found.  Assessment/Plan: 18XW [id number] G2P1011 with known endometriosis, recurrent pain -Dx l/s with possible removal of endometriosis, chromopertubation -Patient is counseled re: risk of bleeding, infection, scarring and damage to surrounding structures.  All questions were answered and the patient wishes to proceed.  Jkwon Treptow 06/05/2016, 7:52 PM

## 2016-06-05 NOTE — Anesthesia Preprocedure Evaluation (Addendum)
Anesthesia Evaluation  Patient identified by MRN, date of birth, ID band Patient awake    Reviewed: Allergy & Precautions, NPO status , Patient's Chart, lab work & pertinent test results  History of Anesthesia Complications Negative for: history of anesthetic complications  Airway Mallampati: III  TM Distance: >3 FB Neck ROM: Full    Dental no notable dental hx. (+) Dental Advisory Given, Teeth Intact,    Pulmonary Current Smoker,    Pulmonary exam normal breath sounds clear to auscultation       Cardiovascular hypertension, negative cardio ROS Normal cardiovascular exam Rhythm:Regular Rate:Normal     Neuro/Psych  Headaches, Chiari malformation type 1 s/p decompression, IIH negative psych ROS   GI/Hepatic negative GI ROS, Neg liver ROS,   Endo/Other  negative endocrine ROS  Renal/GU negative Renal ROS  negative genitourinary   Musculoskeletal negative musculoskeletal ROS (+)   Abdominal   Peds negative pediatric ROS (+)  Hematology negative hematology ROS (+)   Anesthesia Other Findings   Reproductive/Obstetrics negative OB ROS                           Anesthesia Physical Anesthesia Plan  ASA: II  Anesthesia Plan: General   Post-op Pain Management:    Induction: Intravenous  Airway Management Planned: Oral ETT  Additional Equipment:   Intra-op Plan:   Post-operative Plan: Extubation in OR  Informed Consent: I have reviewed the patients History and Physical, chart, labs and discussed the procedure including the risks, benefits and alternatives for the proposed anesthesia with the patient or authorized representative who has indicated his/her understanding and acceptance.   Dental advisory given  Plan Discussed with: CRNA  Anesthesia Plan Comments:         Anesthesia Quick Evaluation

## 2016-06-06 ENCOUNTER — Ambulatory Visit (HOSPITAL_BASED_OUTPATIENT_CLINIC_OR_DEPARTMENT_OTHER): Payer: 59 | Admitting: Anesthesiology

## 2016-06-06 ENCOUNTER — Ambulatory Visit (HOSPITAL_BASED_OUTPATIENT_CLINIC_OR_DEPARTMENT_OTHER)
Admission: RE | Admit: 2016-06-06 | Discharge: 2016-06-06 | Disposition: A | Discharge status: Home / Self Care | Payer: 59 | Source: Ambulatory Visit | Attending: Obstetrics & Gynecology | Admitting: Obstetrics & Gynecology

## 2016-06-06 ENCOUNTER — Encounter (HOSPITAL_BASED_OUTPATIENT_CLINIC_OR_DEPARTMENT_OTHER)
Admission: RE | Disposition: A | Discharge status: Home / Self Care | Payer: Self-pay | Source: Ambulatory Visit | Attending: Obstetrics & Gynecology

## 2016-06-06 ENCOUNTER — Encounter (HOSPITAL_BASED_OUTPATIENT_CLINIC_OR_DEPARTMENT_OTHER): Payer: Self-pay | Admitting: *Deleted

## 2016-06-06 DIAGNOSIS — N809 Endometriosis, unspecified: Secondary | ICD-10-CM

## 2016-06-06 DIAGNOSIS — N801 Endometriosis of ovary: Secondary | ICD-10-CM | POA: Insufficient documentation

## 2016-06-06 DIAGNOSIS — F17210 Nicotine dependence, cigarettes, uncomplicated: Secondary | ICD-10-CM | POA: Insufficient documentation

## 2016-06-06 DIAGNOSIS — G935 Compression of brain: Secondary | ICD-10-CM | POA: Insufficient documentation

## 2016-06-06 DIAGNOSIS — G932 Benign intracranial hypertension: Secondary | ICD-10-CM | POA: Insufficient documentation

## 2016-06-06 DIAGNOSIS — R51 Headache: Secondary | ICD-10-CM | POA: Diagnosis not present

## 2016-06-06 DIAGNOSIS — N803 Endometriosis of pelvic peritoneum: Secondary | ICD-10-CM | POA: Diagnosis present

## 2016-06-06 HISTORY — DX: Personal history of other complications of pregnancy, childbirth and the puerperium: Z87.59

## 2016-06-06 HISTORY — DX: Personal history of other diseases of the female genital tract: Z87.42

## 2016-06-06 HISTORY — PX: LAPAROSCOPY: SHX197

## 2016-06-06 HISTORY — DX: Essential (primary) hypertension: I10

## 2016-06-06 HISTORY — PX: CHROMOPERTUBATION: SHX6288

## 2016-06-06 HISTORY — DX: Benign intracranial hypertension: G93.2

## 2016-06-06 HISTORY — DX: Headache, unspecified: R51.9

## 2016-06-06 HISTORY — DX: Headache: R51

## 2016-06-06 HISTORY — DX: Other chronic pain: G89.29

## 2016-06-06 HISTORY — DX: Compression of brain: G93.5

## 2016-06-06 LAB — POCT I-STAT 4, (NA,K, GLUC, HGB,HCT)
Glucose, Bld: 90 mg/dL (ref 65–99)
HCT: 40 % (ref 36.0–46.0)
Hemoglobin: 13.6 g/dL (ref 12.0–15.0)
POTASSIUM: 3.4 mmol/L — AB (ref 3.5–5.1)
Sodium: 143 mmol/L (ref 135–145)

## 2016-06-06 LAB — POCT PREGNANCY, URINE: PREG TEST UR: NEGATIVE

## 2016-06-06 SURGERY — LAPAROSCOPY, DIAGNOSTIC
Anesthesia: General

## 2016-06-06 MED ORDER — FENTANYL CITRATE (PF) 100 MCG/2ML IJ SOLN
INTRAMUSCULAR | Status: AC
  Filled 2016-06-06: qty 2

## 2016-06-06 MED ORDER — LIDOCAINE 2% (20 MG/ML) 5 ML SYRINGE
INTRAMUSCULAR | Status: AC
  Filled 2016-06-06: qty 5

## 2016-06-06 MED ORDER — ROCURONIUM BROMIDE 100 MG/10ML IV SOLN: INTRAVENOUS | Status: DC | PRN

## 2016-06-06 MED ORDER — LACTATED RINGERS IV SOLN
INTRAVENOUS | Status: DC
  Administered 2016-06-06 (×2): via INTRAVENOUS
  Filled 2016-06-06: qty 1000

## 2016-06-06 MED ORDER — METHYLENE BLUE 0.5 % INJ SOLN
INTRAVENOUS | Status: AC
  Filled 2016-06-06: qty 10

## 2016-06-06 MED ORDER — DEXAMETHASONE SODIUM PHOSPHATE 4 MG/ML IJ SOLN
INTRAMUSCULAR | Status: DC | PRN
  Administered 2016-06-06: 10 mg via INTRAVENOUS

## 2016-06-06 MED ORDER — KETOROLAC TROMETHAMINE 30 MG/ML IJ SOLN
INTRAMUSCULAR | Status: AC
  Filled 2016-06-06: qty 1

## 2016-06-06 MED ORDER — METHYLENE BLUE 0.5 % INJ SOLN
INTRAVENOUS | Status: DC | PRN
  Administered 2016-06-06: 1 mL

## 2016-06-06 MED ORDER — PROPOFOL 10 MG/ML IV BOLUS
INTRAVENOUS | Status: AC
  Filled 2016-06-06: qty 20

## 2016-06-06 MED ORDER — ROCURONIUM BROMIDE 10 MG/ML (PF) SYRINGE
PREFILLED_SYRINGE | INTRAVENOUS | Status: DC | PRN
  Administered 2016-06-06: 40 mg via INTRAVENOUS

## 2016-06-06 MED ORDER — OXYCODONE-ACETAMINOPHEN 5-325 MG PO TABS: 1.0000 | ORAL_TABLET | ORAL | 0 refills | Status: DC | PRN

## 2016-06-06 MED ORDER — DEXAMETHASONE SODIUM PHOSPHATE 10 MG/ML IJ SOLN
INTRAMUSCULAR | Status: AC
  Filled 2016-06-06: qty 1

## 2016-06-06 MED ORDER — KETOROLAC TROMETHAMINE 30 MG/ML IJ SOLN
INTRAMUSCULAR | Status: DC | PRN
  Administered 2016-06-06: 30 mg via INTRAVENOUS

## 2016-06-06 MED ORDER — BUPIVACAINE HCL (PF) 0.25 % IJ SOLN
INTRAMUSCULAR | Status: AC
Start: 2016-06-06 — End: 2016-06-06
  Filled 2016-06-06: qty 30

## 2016-06-06 MED ORDER — PROPOFOL 10 MG/ML IV BOLUS
INTRAVENOUS | Status: DC | PRN
  Administered 2016-06-06: 160 mg via INTRAVENOUS
  Administered 2016-06-06: 30 mg via INTRAVENOUS

## 2016-06-06 MED ORDER — MIDAZOLAM HCL 2 MG/2ML IJ SOLN
INTRAMUSCULAR | Status: AC
  Filled 2016-06-06: qty 2

## 2016-06-06 MED ORDER — WHITE PETROLATUM GEL
Status: AC
Start: 2016-06-06 — End: 2016-06-06
  Filled 2016-06-06: qty 5

## 2016-06-06 MED ORDER — SUGAMMADEX SODIUM 500 MG/5ML IV SOLN
INTRAVENOUS | Status: DC | PRN
  Administered 2016-06-06: 200 mg via INTRAVENOUS

## 2016-06-06 MED ORDER — FENTANYL CITRATE (PF) 100 MCG/2ML IJ SOLN
25.0000 ug | INTRAMUSCULAR | Status: DC | PRN
  Filled 2016-06-06: qty 1

## 2016-06-06 MED ORDER — MIDAZOLAM HCL 5 MG/5ML IJ SOLN
INTRAMUSCULAR | Status: DC | PRN
  Administered 2016-06-06: 2 mg via INTRAVENOUS

## 2016-06-06 MED ORDER — CEFAZOLIN SODIUM-DEXTROSE 2-4 GM/100ML-% IV SOLN
INTRAVENOUS | Status: AC
  Filled 2016-06-06: qty 100

## 2016-06-06 MED ORDER — CEFAZOLIN SODIUM-DEXTROSE 2-4 GM/100ML-% IV SOLN
2.0000 g | INTRAVENOUS | Status: AC
  Administered 2016-06-06: 2 g via INTRAVENOUS
  Filled 2016-06-06: qty 100

## 2016-06-06 MED ORDER — ONDANSETRON HCL 4 MG/2ML IJ SOLN
4.0000 mg | Freq: Once | INTRAMUSCULAR | Status: DC | PRN
  Filled 2016-06-06: qty 2

## 2016-06-06 MED ORDER — ONDANSETRON HCL 4 MG/2ML IJ SOLN
INTRAMUSCULAR | Status: DC | PRN
  Administered 2016-06-06: 4 mg via INTRAVENOUS

## 2016-06-06 MED ORDER — SUGAMMADEX SODIUM 200 MG/2ML IV SOLN
INTRAVENOUS | Status: AC
  Filled 2016-06-06: qty 2

## 2016-06-06 MED ORDER — 0.9 % SODIUM CHLORIDE (POUR BTL) OPTIME
TOPICAL | Status: DC | PRN
  Administered 2016-06-06: 500 mL

## 2016-06-06 MED ORDER — LACTATED RINGERS IV SOLN
INTRAVENOUS | Status: DC
  Filled 2016-06-06: qty 1000

## 2016-06-06 MED ORDER — OXYCODONE-ACETAMINOPHEN 5-325 MG PO TABS
1.0000 | ORAL_TABLET | ORAL | Status: DC | PRN
  Administered 2016-06-06: 1 via ORAL
  Filled 2016-06-06: qty 2

## 2016-06-06 MED ORDER — ROCURONIUM BROMIDE 50 MG/5ML IV SOSY
PREFILLED_SYRINGE | INTRAVENOUS | Status: AC
  Filled 2016-06-06: qty 5

## 2016-06-06 MED ORDER — FENTANYL CITRATE (PF) 100 MCG/2ML IJ SOLN
INTRAMUSCULAR | Status: DC | PRN
  Administered 2016-06-06 (×4): 50 ug via INTRAVENOUS

## 2016-06-06 MED ORDER — ONDANSETRON HCL 4 MG/2ML IJ SOLN
INTRAMUSCULAR | Status: AC
  Filled 2016-06-06: qty 2

## 2016-06-06 MED ORDER — LIDOCAINE HCL (CARDIAC) 20 MG/ML IV SOLN
INTRAVENOUS | Status: DC | PRN
  Administered 2016-06-06: 100 mg via INTRATRACHEAL

## 2016-06-06 MED ORDER — IBUPROFEN 600 MG PO TABS: 600.0000 mg | ORAL_TABLET | Freq: Four times a day (QID) | ORAL | 0 refills | Status: DC | PRN

## 2016-06-06 MED ORDER — OXYCODONE-ACETAMINOPHEN 5-325 MG PO TABS
ORAL_TABLET | ORAL | Status: AC
  Filled 2016-06-06: qty 1

## 2016-06-06 MED ORDER — BUPIVACAINE HCL (PF) 0.25 % IJ SOLN
INTRAMUSCULAR | Status: DC | PRN
  Administered 2016-06-06: 3 mL

## 2016-06-06 SURGICAL SUPPLY — 58 items
APPLICATOR COTTON TIP 6IN STRL (MISCELLANEOUS) IMPLANT
BARRIER ADHS 3X4 INTERCEED (GAUZE/BANDAGES/DRESSINGS) IMPLANT
BENZOIN TINCTURE PRP APPL 2/3 (GAUZE/BANDAGES/DRESSINGS) IMPLANT
BLADE SURG 11 STRL SS (BLADE) ×4 IMPLANT
BLADE SURG 15 STRL LF DISP TIS (BLADE) IMPLANT
BLADE SURG 15 STRL SS (BLADE)
CANISTER SUCTION 1200CC (MISCELLANEOUS) IMPLANT
CANISTER SUCTION 2500CC (MISCELLANEOUS) IMPLANT
CATH ROBINSON RED A/P 14FR (CATHETERS) ×4 IMPLANT
CHLORAPREP W/TINT 26ML (MISCELLANEOUS) ×4 IMPLANT
CLOSURE WOUND 1/2 X4 (GAUZE/BANDAGES/DRESSINGS)
CLOSURE WOUND 1/4X4 (GAUZE/BANDAGES/DRESSINGS)
COVER MAYO STAND STRL (DRAPES) ×4 IMPLANT
DRAPE UNDERBUTTOCKS STRL (DRAPE) ×4 IMPLANT
DRSG COVADERM PLUS 2X2 (GAUZE/BANDAGES/DRESSINGS) ×8 IMPLANT
ELECT REM PT RETURN 9FT ADLT (ELECTROSURGICAL) ×4
ELECTRODE REM PT RTRN 9FT ADLT (ELECTROSURGICAL) ×2 IMPLANT
GLOVE BIO SURGEON STRL SZ 6 (GLOVE) ×4 IMPLANT
GLOVE BIOGEL PI IND STRL 6 (GLOVE) ×4 IMPLANT
GLOVE BIOGEL PI INDICATOR 6 (GLOVE) ×4
GOWN STRL REUS W/ TWL LRG LVL3 (GOWN DISPOSABLE) ×6 IMPLANT
GOWN STRL REUS W/TWL LRG LVL3 (GOWN DISPOSABLE) ×6
KIT ROOM TURNOVER WOR (KITS) ×4 IMPLANT
LEGGING LITHOTOMY PAIR STRL (DRAPES) IMPLANT
LIGASURE LAP L-HOOKWIRE 5 44CM (INSTRUMENTS) IMPLANT
LIQUID BAND (GAUZE/BANDAGES/DRESSINGS) IMPLANT
MANIFOLD NEPTUNE II (INSTRUMENTS) IMPLANT
NEEDLE HYPO 25X1 1.5 SAFETY (NEEDLE) ×4 IMPLANT
NEEDLE INSUFFLATION 120MM (ENDOMECHANICALS) ×4 IMPLANT
NS IRRIG 500ML POUR BTL (IV SOLUTION) ×4 IMPLANT
PACK BASIN DAY SURGERY FS (CUSTOM PROCEDURE TRAY) ×4 IMPLANT
PACK LAPAROSCOPY II (CUSTOM PROCEDURE TRAY) ×4 IMPLANT
PAD OB MATERNITY 4.3X12.25 (PERSONAL CARE ITEMS) ×4 IMPLANT
PADDING ION DISPOSABLE (MISCELLANEOUS) ×4 IMPLANT
PENCIL BUTTON HOLSTER BLD 10FT (ELECTRODE) IMPLANT
POUCH SPECIMEN RETRIEVAL 10MM (ENDOMECHANICALS) IMPLANT
SCISSORS LAP 5X35 DISP (ENDOMECHANICALS) ×4 IMPLANT
SET IRRIG TUBING LAPAROSCOPIC (IRRIGATION / IRRIGATOR) IMPLANT
SOLUTION ANTI FOG 6CC (MISCELLANEOUS) ×4 IMPLANT
SPONGE LAP 4X18 X RAY DECT (DISPOSABLE) IMPLANT
STRIP CLOSURE SKIN 1/2X4 (GAUZE/BANDAGES/DRESSINGS) IMPLANT
STRIP CLOSURE SKIN 1/4X4 (GAUZE/BANDAGES/DRESSINGS) IMPLANT
SUT MNCRL AB 3-0 PS2 18 (SUTURE) ×4 IMPLANT
SUT VICRYL 0 UR6 27IN ABS (SUTURE) ×4 IMPLANT
SYR 20CC LL (SYRINGE) IMPLANT
SYR 50ML LL SCALE MARK (SYRINGE) ×4 IMPLANT
SYR CONTROL 10ML LL (SYRINGE) ×4 IMPLANT
SYRINGE 10CC LL (SYRINGE) ×4 IMPLANT
TOWEL OR 17X24 6PK STRL BLUE (TOWEL DISPOSABLE) ×8 IMPLANT
TRAY DSU PREP LF (CUSTOM PROCEDURE TRAY) ×4 IMPLANT
TROCAR XCEL NON-BLD 11X100MML (ENDOMECHANICALS) ×4 IMPLANT
TROCAR XCEL NON-BLD 5MMX100MML (ENDOMECHANICALS) ×4 IMPLANT
TUBE CONNECTING 12'X1/4 (SUCTIONS)
TUBE CONNECTING 12X1/4 (SUCTIONS) IMPLANT
TUBING EXTENTION W/L.L. (IV SETS) IMPLANT
TUBING INSUFFLATION 10FT LAP (TUBING) ×4 IMPLANT
WARMER LAPAROSCOPE (MISCELLANEOUS) ×4 IMPLANT
WATER STERILE IRR 500ML POUR (IV SOLUTION) ×4 IMPLANT

## 2016-06-06 NOTE — Progress Notes (Signed)
No change to H&P.  Mega Kinkade, DO 

## 2016-06-06 NOTE — Anesthesia Procedure Notes (Signed)
Procedure Name: Intubation Date/Time: 06/06/2016 7:38 AM Performed by: Tyrone NineSAUVE, Trelyn Vanderlinde F Pre-anesthesia Checklist: Patient identified, Timeout performed, Emergency Drugs available, Suction available and Patient being monitored Patient Re-evaluated:Patient Re-evaluated prior to inductionPreoxygenation: Pre-oxygenation with 100% oxygen Intubation Type: IV induction Ventilation: Mask ventilation without difficulty Laryngoscope Size: Mac and 3 Grade View: Grade I Tube type: Oral Tube size: 7.0 mm Number of attempts: 1 Airway Equipment and Method: Stylet Placement Confirmation: ETT inserted through vocal cords under direct vision,  positive ETCO2 and breath sounds checked- equal and bilateral Secured at: 21 cm Tube secured with: Tape Dental Injury: Teeth and Oropharynx as per pre-operative assessment

## 2016-06-06 NOTE — Transfer of Care (Signed)
Immediate Anesthesia Transfer of Care Note  Patient: Olivia CostaStephanie Melendez  Procedure(s) Performed: Procedure(s): LAPAROSCOPY DIAGNOSTIC with possible removal of endometriosis (N/A) CHROMOPERTUBATION (Bilateral)  Patient Location: PACU  Anesthesia Type:General  Level of Consciousness: awake, alert , oriented and patient cooperative  Airway & Oxygen Therapy: Patient Spontanous Breathing and Patient connected to nasal cannula oxygen  Post-op Assessment: Report given to RN and Post -op Vital signs reviewed and stable  Post vital signs: Reviewed and stable  Last Vitals:  Vitals:   06/06/16 0630  BP: (!) 146/87  Pulse: 81  Resp: 16  Temp: 36.5 C    Last Pain:  Vitals:   06/06/16 0715  PainSc: 2       Patients Stated Pain Goal: 8 (06/06/16 0715)  Complications: No apparent anesthesia complications

## 2016-06-06 NOTE — Anesthesia Postprocedure Evaluation (Signed)
Anesthesia Post Note  Patient: Olivia CostaStephanie Melendez  Procedure(s) Performed: Procedure(s) (LRB): LAPAROSCOPY DIAGNOSTIC with possible removal of endometriosis (N/A) CHROMOPERTUBATION (Bilateral)  Patient location during evaluation: PACU Anesthesia Type: General Level of consciousness: awake and alert Pain management: pain level controlled Vital Signs Assessment: post-procedure vital signs reviewed and stable Respiratory status: spontaneous breathing, nonlabored ventilation, respiratory function stable and patient connected to nasal cannula oxygen Cardiovascular status: blood pressure returned to baseline and stable Postop Assessment: no signs of nausea or vomiting Anesthetic complications: no    Last Vitals:  Vitals:   06/06/16 0845 06/06/16 0900  BP: (!) 141/77 139/82  Pulse: 79 72  Resp: 12 14  Temp:      Last Pain:  Vitals:   06/06/16 0900  PainSc: 0-No pain                 [name] JENNETTE

## 2016-06-06 NOTE — Op Note (Signed)
Ivor CostaStephanie Graham 06/06/2016  PREOPERATIVE DIAGNOSIS:  History of endometriosis, recurrent pain  POSTOPERATIVE DIAGNOSIS:  SAA  PROCEDURE:  Diagnostic laparoscopy, fulguration of endometriosis implant, chromopertubation  ANESTHESIA:  General endotracheal  COMPLICATIONS:  None immediate.  ESTIMATED BLOOD LOSS:  Less than 20 ml.  INDICATIONS: 35 y.o. with history of endometriosis with recurrent dysmenorrhea and dyspareunia.      FINDINGS:  Normal uterus and bilateral ovaries.  Bilaterally dilated fallopian tubes with mild clubbing.  Methylene blue spill from right fallopian tube but no dye visualized or spill from left fallopian tube.  Single endometriosis implant (light brown and superficial) noted in left utero-ovarian fossa.  Normal appearing appendix, liver edge and gallbladder.  TECHNIQUE:  The patient was taken to the operating room where general anesthesia was obtained without difficulty.  She was then placed in the dorsal lithotomy position and prepared and draped in sterile fashion.  After an adequate timeout was performed, a bivalved speculum was then placed in the patient's vagina, and the anterior lip of cervix grasped with the single-tooth tenaculum.  The acorn manipulator was advanced into the uterus.  The speculum was removed from the vagina.  Attention was then turned to the patient's abdomen where a 10-mm skin incision was made on the umbilical fold.  The Veress needle was carefully introduced into the peritoneal cavity through the abdominal wall.  Intraperitoneal placement was confirmed by drop in intraabdominal pressure with insufflation of carbon dioxide gas.  Adequate pneumoperitoneum was obtained, and the 10/11 XL trocar was then advanced without difficulty into the abdomen where intraabdominal placement was confirmed by the operative laparoscope.  A 5-mm skin incision was made in the suprapubic region and the 5 trocar was advanced under direct visualization.  The above  dictated findings were noted.  The monopolar shears were used to fulgurate the single endometriosis lesion found.    Attention was then turned to the chromopertubation.  Dilated methylene blue was injected and the right fallopian tube quickly spilled.  The left fallopian tube never spilled and no dye was visualized within the tube.  All instruments were removed from the abdomen.  The fascial incision was closed with 0 vicryl in a figure of eight stitch.  The skin incisions were closed with 3-0 monocryl in a subcuticular fashion.    The uterine manipulator and the tenaculum were removed from the vagina without complications. The patient tolerated the procedure well.  Sponge, lap, and needle counts were correct times two.  The patient was then taken to the recovery room awake, extubated and in stable  in stable condition.

## 2016-06-06 NOTE — Discharge Instructions (Signed)
HOME CARE INSTRUCTIONS - LAPAROSCOPY  Wound Care: The bandaids or dressing which are placed over the skin openings may be removed the day after surgery. The incision should be kept clean and dry. The stitches do not need to be removed. Should the incision become sore, red, and swollen after the first week, check with your doctor.  Personal Hygiene: Shower the day after your procedure. Always wipe from front to back after elimination.   Activity: Do not drive or operate any equipment today. The effects of the anesthesia are still present and drowsiness may result. Rest today, not necessarily flat bed rest, just take it easy. You may resume your normal activity in one to three days or as instructed by your physician.  Sexual Activity: You resume sexual activity as indicated by your physician_________. If your laparoscopy was for a sterilization ( tubes tied ), continue current method of birth control until after your next period or ask for specific instructions from your doctor.  Diet: Eat a light diet as desired this evening. You may resume a regular diet tomorrow.  Return to Work: Two to three days or as indicated by your doctor.  Expectations After Surgery: Your surgery will cause vaginal drainage or spotting which may continue for 2-3 days. Mild abdominal discomfort or tenderness is not unusual and some shoulder pain may also be noted which can be relieved by lying flat in pain.  Call Your Doctor If these Occur:  Persistent or heavy bleeding at incision site       Redness or swelling around incision       Elevation of temperature greater than 100 degrees F  Call for follow-up appointment _____________. Post Anesthesia Home Care Instructions  Activity: Get plenty of rest for the remainder of the day. A responsible adult should stay with you for 24 hours following the procedure.  For the next 24 hours, DO NOT: -Drive a car -Advertising copywriterperate machinery -Drink alcoholic beverages -Take any  medication unless instructed by your physician -Make any legal decisions or sign important papers.  Meals: Start with liquid foods such as gelatin or soup. Progress to regular foods as tolerated. Avoid greasy, spicy, heavy foods. If nausea and/or vomiting occur, drink only clear liquids until the nausea and/or vomiting subsides. Call your physician if vomiting continues.  Special Instructions/Symptoms: Your throat may feel dry or sore from the anesthesia or the breathing tube placed in your throat during surgery. If this causes discomfort, gargle with warm salt water. The discomfort should disappear within 24 hours.  If you had a scopolamine patch placed behind your ear for the management of post- operative nausea and/or vomiting:  1. The medication in the patch is effective for 72 hours, after which it should be removed.  Wrap patch in a tissue and discard in the trash. Wash hands thoroughly with soap and water. 2. You may remove the patch earlier than 72 hours if you experience unpleasant side effects which may include dry mouth, dizziness or visual disturbances. 3. Avoid touching the patch. Wash your hands with soap and water after contact with the patch.   Call MD for T>100.4, severe abdominal pain, intractable nausea and/or vomiting, or respiratory distress.  Call office to schedule postop appointment in 2 weeks.  No driving while taking narcotics.  Pelvic rest x 2 weeks.

## 2016-06-07 ENCOUNTER — Encounter (HOSPITAL_BASED_OUTPATIENT_CLINIC_OR_DEPARTMENT_OTHER): Payer: Self-pay | Admitting: Obstetrics & Gynecology

## 2017-03-31 ENCOUNTER — Encounter (HOSPITAL_BASED_OUTPATIENT_CLINIC_OR_DEPARTMENT_OTHER): Payer: Self-pay | Admitting: *Deleted

## 2017-03-31 NOTE — Progress Notes (Signed)
NPO AFTER MN.  ARRIVE AT 0700. NEEDS HG AND URINE PREG.  PT VERBALIZED UNDERSTANDING SINCE SHE IS NOT TAKING BP MED. , BP WILL BE ASSESSED BY ANESTHESIOLOGIST DOS.

## 2017-04-11 ENCOUNTER — Encounter (HOSPITAL_BASED_OUTPATIENT_CLINIC_OR_DEPARTMENT_OTHER): Payer: Self-pay | Admitting: *Deleted

## 2017-04-11 ENCOUNTER — Ambulatory Visit (HOSPITAL_BASED_OUTPATIENT_CLINIC_OR_DEPARTMENT_OTHER): Payer: BLUE CROSS/BLUE SHIELD | Admitting: Anesthesiology

## 2017-04-11 ENCOUNTER — Encounter (HOSPITAL_BASED_OUTPATIENT_CLINIC_OR_DEPARTMENT_OTHER)
Admission: RE | Disposition: A | Discharge status: Home / Self Care | Payer: Self-pay | Source: Ambulatory Visit | Attending: Obstetrics and Gynecology

## 2017-04-11 ENCOUNTER — Ambulatory Visit (HOSPITAL_BASED_OUTPATIENT_CLINIC_OR_DEPARTMENT_OTHER)
Admission: RE | Admit: 2017-04-11 | Discharge: 2017-04-11 | Disposition: A | Discharge status: Home / Self Care | Payer: BLUE CROSS/BLUE SHIELD | Source: Ambulatory Visit | Attending: Obstetrics and Gynecology | Admitting: Obstetrics and Gynecology

## 2017-04-11 DIAGNOSIS — N971 Female infertility of tubal origin: Secondary | ICD-10-CM | POA: Insufficient documentation

## 2017-04-11 DIAGNOSIS — F17210 Nicotine dependence, cigarettes, uncomplicated: Secondary | ICD-10-CM | POA: Diagnosis not present

## 2017-04-11 DIAGNOSIS — G935 Compression of brain: Secondary | ICD-10-CM | POA: Insufficient documentation

## 2017-04-11 DIAGNOSIS — R51 Headache: Secondary | ICD-10-CM | POA: Diagnosis not present

## 2017-04-11 DIAGNOSIS — I10 Essential (primary) hypertension: Secondary | ICD-10-CM | POA: Insufficient documentation

## 2017-04-11 DIAGNOSIS — N803 Endometriosis of pelvic peritoneum: Secondary | ICD-10-CM | POA: Diagnosis not present

## 2017-04-11 DIAGNOSIS — Q512 Other doubling of uterus: Secondary | ICD-10-CM | POA: Insufficient documentation

## 2017-04-11 DIAGNOSIS — N856 Intrauterine synechiae: Secondary | ICD-10-CM | POA: Insufficient documentation

## 2017-04-11 DIAGNOSIS — G932 Benign intracranial hypertension: Secondary | ICD-10-CM | POA: Insufficient documentation

## 2017-04-11 HISTORY — DX: Phimosis: N47.1

## 2017-04-11 HISTORY — DX: Other noninflammatory disorders of ovary, fallopian tube and broad ligament: N83.8

## 2017-04-11 HISTORY — DX: Polyp of other parts of female genital tract: N84.8

## 2017-04-11 HISTORY — DX: Female infertility, unspecified: N97.9

## 2017-04-11 HISTORY — DX: Female pelvic peritoneal adhesions (postinfective): N73.6

## 2017-04-11 HISTORY — PX: LAPAROSCOPY: SHX197

## 2017-04-11 LAB — POCT PREGNANCY, URINE: Preg Test, Ur: NEGATIVE

## 2017-04-11 LAB — POCT HEMOGLOBIN-HEMACUE: HEMOGLOBIN: 13.6 g/dL (ref 12.0–15.0)

## 2017-04-11 SURGERY — LAPAROSCOPY, DIAGNOSTIC
Anesthesia: General | Laterality: Bilateral

## 2017-04-11 MED ORDER — SUGAMMADEX SODIUM 200 MG/2ML IV SOLN
INTRAVENOUS | Status: DC | PRN
  Administered 2017-04-11: 200 mg via INTRAVENOUS

## 2017-04-11 MED ORDER — ONDANSETRON HCL 4 MG PO TABS: 4.0000 mg | ORAL_TABLET | Freq: Every day | ORAL | 1 refills | Status: AC | PRN

## 2017-04-11 MED ORDER — DEXAMETHASONE SODIUM PHOSPHATE 10 MG/ML IJ SOLN
INTRAMUSCULAR | Status: AC
  Filled 2017-04-11: qty 1

## 2017-04-11 MED ORDER — DEXAMETHASONE SODIUM PHOSPHATE 4 MG/ML IJ SOLN
INTRAMUSCULAR | Status: DC | PRN
  Administered 2017-04-11: 10 mg via INTRAVENOUS

## 2017-04-11 MED ORDER — OXYCODONE-ACETAMINOPHEN 7.5-325 MG PO TABS: 1.0000 | ORAL_TABLET | ORAL | 0 refills | Status: DC | PRN

## 2017-04-11 MED ORDER — FENTANYL CITRATE (PF) 100 MCG/2ML IJ SOLN
INTRAMUSCULAR | Status: AC
  Filled 2017-04-11: qty 2

## 2017-04-11 MED ORDER — LACTATED RINGERS IV SOLN
INTRAVENOUS | Status: DC
  Administered 2017-04-11 (×2): via INTRAVENOUS
  Filled 2017-04-11: qty 1000

## 2017-04-11 MED ORDER — METHYLENE BLUE 0.5 % INJ SOLN
INTRAVENOUS | Status: DC | PRN
  Administered 2017-04-11: 10 mL

## 2017-04-11 MED ORDER — LIDOCAINE 2% (20 MG/ML) 5 ML SYRINGE
INTRAMUSCULAR | Status: AC
  Filled 2017-04-11: qty 5

## 2017-04-11 MED ORDER — CEFAZOLIN SODIUM-DEXTROSE 2-4 GM/100ML-% IV SOLN
INTRAVENOUS | Status: AC
  Filled 2017-04-11: qty 100

## 2017-04-11 MED ORDER — CEFAZOLIN SODIUM-DEXTROSE 2-4 GM/100ML-% IV SOLN
2.0000 g | INTRAVENOUS | Status: AC
Start: 2017-04-11 — End: 2017-04-11
  Administered 2017-04-11: 2 g via INTRAVENOUS
  Filled 2017-04-11: qty 100

## 2017-04-11 MED ORDER — SUGAMMADEX SODIUM 200 MG/2ML IV SOLN
INTRAVENOUS | Status: AC
  Filled 2017-04-11: qty 2

## 2017-04-11 MED ORDER — KETOROLAC TROMETHAMINE 30 MG/ML IJ SOLN
30.0000 mg | Freq: Once | INTRAMUSCULAR | Status: DC | PRN
  Filled 2017-04-11: qty 1

## 2017-04-11 MED ORDER — KETOROLAC TROMETHAMINE 30 MG/ML IJ SOLN
INTRAMUSCULAR | Status: DC | PRN
  Administered 2017-04-11: 30 mg via INTRAVENOUS

## 2017-04-11 MED ORDER — MIDAZOLAM HCL 2 MG/2ML IJ SOLN
INTRAMUSCULAR | Status: AC
  Filled 2017-04-11: qty 2

## 2017-04-11 MED ORDER — ROCURONIUM BROMIDE 50 MG/5ML IV SOSY
PREFILLED_SYRINGE | INTRAVENOUS | Status: AC
  Filled 2017-04-11: qty 5

## 2017-04-11 MED ORDER — PROPOFOL 10 MG/ML IV BOLUS
INTRAVENOUS | Status: AC
  Filled 2017-04-11: qty 20

## 2017-04-11 MED ORDER — KETOROLAC TROMETHAMINE 30 MG/ML IJ SOLN
INTRAMUSCULAR | Status: AC
  Filled 2017-04-11: qty 1

## 2017-04-11 MED ORDER — HYDROMORPHONE HCL 1 MG/ML IJ SOLN
0.2500 mg | INTRAMUSCULAR | Status: DC | PRN
  Administered 2017-04-11 (×2): 0.5 mg via INTRAVENOUS
  Filled 2017-04-11: qty 0.5

## 2017-04-11 MED ORDER — OXYCODONE HCL 5 MG PO TABS
5.0000 mg | ORAL_TABLET | Freq: Once | ORAL | Status: DC | PRN
  Filled 2017-04-11: qty 1

## 2017-04-11 MED ORDER — LIDOCAINE 2% (20 MG/ML) 5 ML SYRINGE
INTRAMUSCULAR | Status: DC | PRN
  Administered 2017-04-11: 80 mg via INTRAVENOUS

## 2017-04-11 MED ORDER — ROCURONIUM BROMIDE 50 MG/5ML IV SOSY
PREFILLED_SYRINGE | INTRAVENOUS | Status: DC | PRN
  Administered 2017-04-11: 10 mg via INTRAVENOUS
  Administered 2017-04-11: 50 mg via INTRAVENOUS
  Administered 2017-04-11: 10 mg via INTRAVENOUS

## 2017-04-11 MED ORDER — HYDROMORPHONE HCL-NACL 0.5-0.9 MG/ML-% IV SOSY
PREFILLED_SYRINGE | INTRAVENOUS | Status: AC
  Filled 2017-04-11: qty 1

## 2017-04-11 MED ORDER — PROMETHAZINE HCL 25 MG/ML IJ SOLN
6.2500 mg | INTRAMUSCULAR | Status: DC | PRN
  Filled 2017-04-11: qty 1

## 2017-04-11 MED ORDER — ONDANSETRON HCL 4 MG/2ML IJ SOLN
INTRAMUSCULAR | Status: DC | PRN
  Administered 2017-04-11: 4 mg via INTRAVENOUS

## 2017-04-11 MED ORDER — MIDAZOLAM HCL 5 MG/5ML IJ SOLN
INTRAMUSCULAR | Status: DC | PRN
  Administered 2017-04-11: 2 mg via INTRAVENOUS

## 2017-04-11 MED ORDER — OXYCODONE HCL 5 MG/5ML PO SOLN
5.0000 mg | Freq: Once | ORAL | Status: DC | PRN
  Filled 2017-04-11: qty 5

## 2017-04-11 MED ORDER — ONDANSETRON HCL 4 MG/2ML IJ SOLN
INTRAMUSCULAR | Status: AC
  Filled 2017-04-11: qty 2

## 2017-04-11 MED ORDER — SODIUM CHLORIDE 0.9 % IV SOLN
INTRAVENOUS | Status: DC | PRN
  Administered 2017-04-11: 10 mL via INTRAMUSCULAR

## 2017-04-11 MED ORDER — FENTANYL CITRATE (PF) 100 MCG/2ML IJ SOLN
INTRAMUSCULAR | Status: DC | PRN
  Administered 2017-04-11 (×2): 25 ug via INTRAVENOUS
  Administered 2017-04-11 (×2): 50 ug via INTRAVENOUS
  Administered 2017-04-11 (×2): 25 ug via INTRAVENOUS

## 2017-04-11 MED ORDER — BUPIVACAINE-EPINEPHRINE 0.25% -1:200000 IJ SOLN
INTRAMUSCULAR | Status: DC | PRN
  Administered 2017-04-11: 4 mL

## 2017-04-11 MED ORDER — PROPOFOL 10 MG/ML IV BOLUS
INTRAVENOUS | Status: DC | PRN
  Administered 2017-04-11: 180 mg via INTRAVENOUS

## 2017-04-11 MED FILL — OXYCODON-ACETAMINOPHEN 7.5-: 7.5-325 | 3 days supply | Qty: 15 | Fill #0

## 2017-04-11 SURGICAL SUPPLY — 59 items
BIPOLAR CUTTING LOOP 21FR (ELECTRODE)
CABLE HIGH FREQUENCY MONO STRZ (ELECTRODE) ×3 IMPLANT
CANISTER SUCT 3000ML PPV (MISCELLANEOUS) ×3 IMPLANT
CANNULA CURETTE W/SYR 6 (CANNULA) IMPLANT
CANNULA CURETTE W/SYR 6MM (CANNULA)
CANNULA CURETTE W/SYR 7 (CANNULA) IMPLANT
CANNULA CURETTE W/SYR 7MM (CANNULA)
CATH ROBINSON RED A/P 16FR (CATHETERS) ×3 IMPLANT
CATH UROLOGY TORQUE 40 (MISCELLANEOUS) ×3 IMPLANT
CLOTH BEACON ORANGE TIMEOUT ST (SAFETY) ×3 IMPLANT
CONT SPECI 4OZ STER CLIK (MISCELLANEOUS) IMPLANT
DERMABOND ADVANCED (GAUZE/BANDAGES/DRESSINGS)
DERMABOND ADVANCED .7 DNX12 (GAUZE/BANDAGES/DRESSINGS) IMPLANT
DILATOR CANAL MILEX (MISCELLANEOUS) IMPLANT
DRSG COVADERM PLUS 2X2 (GAUZE/BANDAGES/DRESSINGS) ×6 IMPLANT
DRSG OPSITE POSTOP 3X4 (GAUZE/BANDAGES/DRESSINGS) ×3 IMPLANT
DURAPREP 26ML APPLICATOR (WOUND CARE) ×3 IMPLANT
ELECT BIPOLAR POINTED 21FR (MISCELLANEOUS)
ELECT REM PT RETURN 9FT ADLT (ELECTROSURGICAL) ×3
ELECTRODE LOOP CTNG BIPLR 21FR (MISCELLANEOUS) IMPLANT
ELECTRODE REM PT RTRN 9FT ADLT (ELECTROSURGICAL) ×1 IMPLANT
GLOVE BIO SURGEON STRL SZ8 (GLOVE) ×6 IMPLANT
GOWN STRL REUS W/TWL LRG LVL3 (GOWN DISPOSABLE) ×3 IMPLANT
GOWN STRL REUS W/TWL XL LVL3 (GOWN DISPOSABLE) ×3 IMPLANT
GUIDEWIRE .038 PTFE COATED (WIRE) ×3 IMPLANT
IV NS IRRIG 3000ML ARTHROMATIC (IV SOLUTION) ×3 IMPLANT
KIT RM TURNOVER CYSTO AR (KITS) ×3 IMPLANT
LOOP CUTTING BIPOLAR 21FR (ELECTRODE) IMPLANT
MANIPULATOR UTERINE 4.5 ZUMI (MISCELLANEOUS) ×3 IMPLANT
NEEDLE INSUFFLATION 120MM (ENDOMECHANICALS) ×3 IMPLANT
PACK BASIN DAY SURGERY FS (CUSTOM PROCEDURE TRAY) ×3 IMPLANT
PACK LAPAROSCOPY BASIN (CUSTOM PROCEDURE TRAY) ×3 IMPLANT
PACK TRENDGUARD 450 HYBRID PRO (MISCELLANEOUS) ×1 IMPLANT
PACK VAGINAL MINOR WOMEN LF (CUSTOM PROCEDURE TRAY) ×3 IMPLANT
PAD OB MATERNITY 4.3X12.25 (PERSONAL CARE ITEMS) ×3 IMPLANT
POUCH SPECIMEN RETRIEVAL 10MM (ENDOMECHANICALS) IMPLANT
SEPRAFILM MEMBRANE 5X6 (MISCELLANEOUS) IMPLANT
SET IRRIG TUBING LAPAROSCOPIC (IRRIGATION / IRRIGATOR) IMPLANT
SET IRRIG Y TYPE TUR BLADDER L (SET/KITS/TRAYS/PACK) IMPLANT
STENT BALLN UTERINE 3CM 6FR (STENTS) IMPLANT
STENT BALLN UTERINE 4CM 6FR (STENTS) IMPLANT
SUT MNCRL AB 4-0 PS2 18 (SUTURE) ×3 IMPLANT
SUT SILK 2 0 SH (SUTURE) IMPLANT
SUT SILK 3 0 PS 1 (SUTURE) IMPLANT
SUT VICRYL 0 UR6 27IN ABS (SUTURE) ×3 IMPLANT
SYR 20CC LL (SYRINGE) IMPLANT
SYR 30ML LL (SYRINGE) ×3 IMPLANT
SYR 3ML 18GX1 1/2 (SYRINGE) ×3 IMPLANT
SYR 50ML LL SCALE MARK (SYRINGE) IMPLANT
SYR 5ML LL (SYRINGE) ×3 IMPLANT
SYR CONTROL 10ML LL (SYRINGE) IMPLANT
TOWEL OR 17X24 6PK STRL BLUE (TOWEL DISPOSABLE) ×6 IMPLANT
TRAY FOLEY CATH SILVER 14FR (SET/KITS/TRAYS/PACK) IMPLANT
TRENDGUARD 450 HYBRID PRO PACK (MISCELLANEOUS) ×3
TROCAR OPTI TIP 5M 100M (ENDOMECHANICALS) ×6 IMPLANT
TUBING INSUF HEATED (TUBING) ×3 IMPLANT
UROLOGY TORQUE CATHETER ×3 IMPLANT
WARMER LAPAROSCOPE (MISCELLANEOUS) ×3 IMPLANT
WATER STERILE IRR 500ML POUR (IV SOLUTION) ×3 IMPLANT

## 2017-04-11 NOTE — Anesthesia Preprocedure Evaluation (Signed)
Anesthesia Evaluation  Patient identified by MRN, date of birth, ID band Patient awake    Reviewed: Allergy & Precautions, NPO status , Patient's Chart, lab work & pertinent test results  Airway Mallampati: II  TM Distance: >3 FB Neck ROM: Full    Dental no notable dental hx.    Pulmonary Current Smoker,    Pulmonary exam normal breath sounds clear to auscultation       Cardiovascular hypertension, Normal cardiovascular exam Rhythm:Regular Rate:Normal     Neuro/Psych negative neurological ROS  negative psych ROS   GI/Hepatic negative GI ROS, Neg liver ROS,   Endo/Other  negative endocrine ROS  Renal/GU negative Renal ROS  negative genitourinary   Musculoskeletal negative musculoskeletal ROS (+)   Abdominal   Peds negative pediatric ROS (+)  Hematology negative hematology ROS (+)   Anesthesia Other Findings   Reproductive/Obstetrics negative OB ROS                             Anesthesia Physical Anesthesia Plan  ASA: II  Anesthesia Plan: General   Post-op Pain Management:    Induction: Intravenous  PONV Risk Score and Plan: 2 and Ondansetron and Dexamethasone  Airway Management Planned: Oral ETT  Additional Equipment:   Intra-op Plan:   Post-operative Plan: Extubation in OR  Informed Consent: I have reviewed the patients History and Physical, chart, labs and discussed the procedure including the risks, benefits and alternatives for the proposed anesthesia with the patient or authorized representative who has indicated his/her understanding and acceptance.   Dental advisory given  Plan Discussed with: CRNA and Surgeon  Anesthesia Plan Comments:         Anesthesia Quick Evaluation

## 2017-04-11 NOTE — H&P (Addendum)
Olivia Melendez is a 36 y.o. female , originally referred to me by Dr.Megan Morris, for laparoscopy, lysis of adhesions, hysteroscopy, removal of phimosis of left tube, hysteroscopic, polypectomy.  She was diagnosed with endometriosis and a polyp because of abnormal uterine bleeding and discomfort She has hx of left cornual ectopic treated with methotrexate.  She has been having monthly periods but with heavy flow and prolonged duration.  Patient would like to preserve her childbearing potential.  Pertinent Gynecological History: Menses: flow is excessive with use of 3 pads or tampons on heaviest days Bleeding: dysfunctional uterine bleeding Contraception: none DES exposure: denies Blood transfusions: none Sexually transmitted diseases: no past history Previous GYN Procedures:Hysteroscopy, polypectomy Last pap: normal     Menstrual History: Menarche age: 33 No LMP recorded.    Past Medical History:  Diagnosis Date  . Chiari malformation type I (HCC) followed at [facility] (neurologist/ oncologist)   06-07-2012  at [facility]  s/p  supocciptal crainectomy chiari decompression  . Chronic headaches    intermitant occiptal headaches , hx chiari malformation  . History of ectopic pregnancy    08/ 2017   treated w/ methotexrate  . History of endometriosis   . Hypertension    06-06-2016 per pt has taken not medication, diamox, for past several months due to finances no insurance in between jobs/  03-31-2017 per pt has appt w/ pcp to take something else that can be taken while preg.  Marland Kitchen IIH (idiopathic intracranial hypertension)    per pt dx 2014 --  approx. every 6 months gets spinal tap at [facility] radiology  , last one 08-19-2015  . Infertility, female   . Pelvic adhesions   . Phimosis    left fallopian tube  . Polyp of fallopian tube    ostia tubal polyp                    Past Surgical History:  Procedure Laterality Date  . CHROMOPERTUBATION Bilateral 06/06/2016   Procedure:  CHROMOPERTUBATION;  Surgeon: Mitchel Honour, DO;  Location: [facility];  Service: Gynecology;  Laterality: Bilateral;  . CRANIECTOMY SUBOCCIPITAL W/ CERVICAL LAMINECTOMY / CHIARI  06/07/2012   Decompression w/ resection posterior arch of C1 (chiari malformation Type 1)   . DX LAPAROSCOPY W/  ABLATION ENDOMETRIOSIS  2001 and 2006  . LAPAROSCOPY N/A 06/06/2016   Procedure: LAPAROSCOPY DIAGNOSTIC with possible removal of endometriosis;  Surgeon: Mitchel Honour, DO;  Location: [facility];  Service: Gynecology;  Laterality: N/A;  . SPINAL PUNCTURE LUMBAR DIAG ([facility] HX)  last one at [facility] 08-19-2015   for chiari occiptal headaches  . TONSILLECTOMY  child  . WISDOM TOOTH EXTRACTION  1994             History reviewed. No pertinent family history. No hereditary disease.  No cancer of breast, ovary, uterus.  Social History   Social History  . Marital status: Married    Spouse name: N/A  . Number of children: N/A  . Years of education: N/A   Occupational History  . Not on file.   Social History Main Topics  . Smoking status: Current Every Day Smoker    Packs/day: 1.00    Years: 17.00    Types: Cigarettes  . Smokeless tobacco: Never Used  . Alcohol use 4.2 - 8.4 oz/week    7 - 14 Glasses of wine per week     Comment: 1-2 daily wine  . Drug use: No  . Sexual activity:  Yes    Birth control/ protection: None   Other Topics Concern  . Not on file   Social History Narrative  . No narrative on file    No Known Allergies  No current facility-administered medications on file prior to encounter.    No current outpatient prescriptions on file prior to encounter.     Review of Systems  Constitutional: Negative.   HENT: Negative.   Eyes: Negative.   Respiratory: Negative.   Cardiovascular: Negative.   Gastrointestinal: Negative.   Genitourinary: Negative.   Musculoskeletal: Negative.   Skin: Negative.   Neurological: Negative.    Endo/Heme/Allergies: Negative.   Psychiatric/Behavioral: Negative.      Physical Exam  BP 132/83   Pulse 86   Temp 98 F (36.7 C) (Oral)   Resp 16   Ht 5' 3.5" (1.613 m)   Wt 75.8 kg (167 lb)   LMP 03/21/2017 (Exact Date)   SpO2 100%   BMI 29.12 kg/m  Constitutional: She is oriented to person, place, and time. She appears well-developed and well-nourished.  HENT:  Head: Normocephalic and atraumatic.  Nose: Nose normal.  Mouth/Throat: Oropharynx is clear and moist. No oropharyngeal exudate.  Eyes: Conjunctivae normal and EOM are normal. Pupils are equal, round, and reactive to light. No scleral icterus.  Neck: Normal range of motion. Neck supple. No tracheal deviation present. No thyromegaly present.  Cardiovascular: Normal rate.   Respiratory: Effort normal and breath sounds normal.  GI: Soft. Bowel sounds are normal. She exhibits no distension and no mass. There is no tenderness.  Lymphadenopathy:    She has no cervical adenopathy.  Neurological: She is alert and oriented to person, place, and time. She has normal reflexes.  Skin: Skin is warm.  Psychiatric: She has a normal mood and affect. Her behavior is normal. Judgment and thought content normal.    Assessment/Plan:  tubal ostia polyp, tubal phimosis, endometriosis Preoperative for Laparoscopy, lysis of adhesions, removal of phimosis of left tube, hysteroscopy, polypectomy  Benefits and risks of laparoscopy, lysis of adhesions, removal of phimosis of left tube, hysteroscopy, polypectomy were discussed with the patient and her family member again.  All of patient's questions were answered.  She verbalized understanding.

## 2017-04-11 NOTE — Transfer of Care (Signed)
Immediate Anesthesia Transfer of Care Note  Patient: Olivia Melendez  Procedure(s) Performed: Procedure(s) (LRB): hysteroscopy lysis of adhesions, incision of uterine septum, suction D and C, and bilateral proximal tubal recannulization LAPAROSCOPy, excision of endometriosis and chromotubation (Bilateral)  Patient Location: PACU  Anesthesia Type: General  Level of Consciousness: awake, oriented, sedated and patient cooperative  Airway & Oxygen Therapy: Patient Spontanous Breathing and Patient connected to face mask oxygen  Post-op Assessment: Report given to PACU RN and Post -op Vital signs reviewed and stable  Post vital signs: Reviewed and stable  Complications: No apparent anesthesia complications  Last Vitals:  Vitals:   04/11/17 0653  BP: 132/83  Pulse: 86  Resp: 16  Temp: 36.7 C  SpO2: 100%    Last Pain:  Vitals:   04/11/17 0653  TempSrc: Oral      Patients Stated Pain Goal: 9 (04/11/17 0712)

## 2017-04-11 NOTE — Anesthesia Postprocedure Evaluation (Signed)
Anesthesia Post Note  Patient: Olivia Melendez  Procedure(s) Performed: Procedure(s) (LRB): hysteroscopy lysis of adhesions, incision of uterine septum, suction D and C, and bilateral proximal tubal recannulization LAPAROSCOPy, excision of endometriosis and chromotubation (Bilateral)     Patient location during evaluation: PACU Anesthesia Type: General Level of consciousness: awake Pain management: pain level controlled Vital Signs Assessment: post-procedure vital signs reviewed and stable Respiratory status: spontaneous breathing Cardiovascular status: stable Postop Assessment: no signs of nausea or vomiting Anesthetic complications: no    Last Vitals:  Vitals:   04/11/17 1205 04/11/17 1215  BP: (!) 129/96 135/89  Pulse: 84 84  Resp: 19 17  Temp: 36.4 C   SpO2: 100% 100%    Last Pain:  Vitals:   04/11/17 1215  TempSrc:   PainSc: 7    Pain Goal: Patients Stated Pain Goal: 9 (04/11/17 0712)               Semaje Kinker JR,JOHN Susann Givens

## 2017-04-11 NOTE — Anesthesia Procedure Notes (Signed)
Procedure Name: Intubation Date/Time: 04/11/2017 9:58 AM Performed by: Denna Haggard D Pre-anesthesia Checklist: Patient identified, Emergency Drugs available, Suction available and Patient being monitored Patient Re-evaluated:Patient Re-evaluated prior to induction Oxygen Delivery Method: Circle system utilized Preoxygenation: Pre-oxygenation with 100% oxygen Induction Type: IV induction Ventilation: Mask ventilation without difficulty Laryngoscope Size: Mac and 3 Grade View: Grade I Tube type: Oral Tube size: 7.0 mm Number of attempts: 1 Airway Equipment and Method: Stylet and Oral airway Placement Confirmation: ETT inserted through vocal cords under direct vision,  positive ETCO2 and breath sounds checked- equal and bilateral Secured at: 22 cm Tube secured with: Tape Dental Injury: Teeth and Oropharynx as per pre-operative assessment

## 2017-04-11 NOTE — Discharge Instructions (Signed)
°Post Anesthesia Home Care Instructions ° °Activity: °Get plenty of rest for the remainder of the day. A responsible individual must stay with you for 24 hours following the procedure.  °For the next 24 hours, DO NOT: °-Drive a car °-Operate machinery °-Drink alcoholic beverages °-Take any medication unless instructed by your physician °-Make any legal decisions or sign important papers. ° °Meals: °Start with liquid foods such as gelatin or soup. Progress to regular foods as tolerated. Avoid greasy, spicy, heavy foods. If nausea and/or vomiting occur, drink only clear liquids until the nausea and/or vomiting subsides. Call your physician if vomiting continues. ° °Special Instructions/Symptoms: °Your throat may feel dry or sore from the anesthesia or the breathing tube placed in your throat during surgery. If this causes discomfort, gargle with warm salt water. The discomfort should disappear within 24 hours. ° °If you had a scopolamine patch placed behind your ear for the management of post- operative nausea and/or vomiting: ° °1. The medication in the patch is effective for 72 hours, after which it should be removed.  Wrap patch in a tissue and discard in the trash. Wash hands thoroughly with soap and water. °2. You may remove the patch earlier than 72 hours if you experience unpleasant side effects which may include dry mouth, dizziness or visual disturbances. °3. Avoid touching the patch. Wash your hands with soap and water after contact with the patch. °  °DISCHARGE INSTRUCTIONS: Laparoscopy ° °The following instructions have been prepared to help you care for yourself upon your return home today. ° °Wound care: °• Do not get the incision wet for the first 24 hours. The incision should be kept clean and dry. °• The Band-Aids or dressings may be removed the day after surgery. °• Should the incision become sore, red, and swollen after the first week, check with your doctor. ° °Personal hygiene: °• Shower the day  after your procedure. ° °Activity and limitations: °• Do NOT drive or operate any equipment today. °• Do NOT lift anything more than 15 pounds for 2-3 weeks after surgery. °• Do NOT rest in bed all day. °• Walking is encouraged. Walk each day, starting slowly with 5-minute walks 3 or 4 times a day. Slowly increase the length of your walks. °• Walk up and down stairs slowly. °• Do NOT do strenuous activities, such as golfing, playing tennis, bowling, running, biking, weight lifting, gardening, mowing, or vacuuming for 2-4 weeks. Ask your doctor when it is okay to start. ° °Diet: Eat a light meal as desired this evening. You may resume your usual diet tomorrow. ° °Return to work: This is dependent on the type of work you do. For the most part you can return to a desk job within a week of surgery. If you are more active at work, please discuss this with your doctor. ° °What to expect after your surgery: You may have a slight burning sensation when you urinate on the first day. You may have a very small amount of blood in the urine. Expect to have a small amount of vaginal discharge/light bleeding for 1-2 weeks. It is not unusual to have abdominal soreness and bruising for up to 2 weeks. You may be tired and need more rest for about 1 week. You may experience shoulder pain for 24-72 hours. Lying flat in bed may relieve it. ° °Call your doctor for any of the following: °• Develop a fever of 100.4 or greater °• Inability to urinate 6 hours after discharge   from hospital °• Severe pain not relieved by pain medications °• Persistent of heavy bleeding at incision site °• Redness or swelling around incision site after a week °• Increasing nausea or vomiting °

## 2017-04-11 NOTE — Op Note (Signed)
OPERATIVE NOTE  Preoperative diagnosis: Endometrial (left tubal ostial) polyp, left proximal tubal occlusion with history of cornual ectopic pregnancy, endometriosis  Postoperative diagnosis: Intrauterine adhesions (stage I), partial uterine septum, bilateral proximal tubal occlusion, stage I endometriosis of pelvic peritoneum  Procedure: Hysteroscopy, lysis of adhesions, incision of uterine septum, suction D&C, bilateral proximal tubal recannulization, laparoscopy, excision of endometriosis, chromotubation  Surgeon: Fermin Schwab  Anesthesia: General  Complications: None  Estimated blood loss: Less than 20 mL  Specimen: Endometrial curettings and biopsy from left ovarian fossa peritoneum to pathology  Findings: On exam under anesthesia, external genitalia, Bartholin's, Skene's, and urethra were normal. The vagina was normal. The cervix had no gross lesions. Uterus was retroflexed and mobile and normal size. There was no posterior cul-de-sac nodularity palpable. There were no adnexal masses palpable. On hysteroscopy, endocervical canal appeared normal. The uterus sounded to 8 cm. Endometrial cavity had filmy organized adhesions obscuring both sides of the uterine fundus such that the tubal ostia could not be seen. In addition there was a 1.2 cm partial uterine septum. After careful lysis of adhesions both tubal ostia were seen. The uterine cavity was returned to normal configuration after incision of partial uterine septum. On laparoscopy, liver edge, diaphragm surfaces were normal. The appendix was not visualized. Anterior cul-de-sac appeared normal. The uterus appeared normal with normal uterotubal junctions. Both tubes appeared normal proximally and distally with fimbria 5 out of 5. Although there was initially bilateral proximal occlusion, after recannulization, both tubes filled and spilled methylene blue. This was confirmed during a transcervical chromotubation as well. There were  nonpigmented clear lesions of endometriosis in the posterior cul-de-sac, to 3 mm in aggregate. There was a clear lesion with stellate fibrosis surrounding it in the left ovarian fossa over the course of the left ureter. This lesion was excised after hydrodissection of the peritoneum. The left ovary looked normal. The right ovary contained a corpus luteum. There were no other lesions of endometriosis.  Description of procedure: Patient was placed in dorsal supine position. General anesthesia was administered. Prophylactic antibiotics were given IV. She was placed in lithotomy position. A Foley catheter was inserted into the bladder. She was prepped and draped in sterile manner for both a hysteroscopy and laparoscopy. A vaginal speculum was placed. A dilute vasopressin solution containing 0.33 units per milliliter was injected into the cervical stroma x 5 cc. A Slimline hysteroscope with 30 lens was inserted into the canal and above findings were noted. Distention medium was saline. Distention method was gravity.  Above findings were noted.  Using hysteroscopic scissors Bilateral fundal adhesions were lysed. Using the same scissors the midline partial uterine septum was dissected up to the level of an imaginary line connecting both tubal ostia. Hemostasis was insured.  We then used a manual evacuation device [facility]) with a 6 mm suction tip and gently curetted the endometrium. The surgeon then was regowned and regloved and an operative field was created on the abdomen. A intraumbilical skin incision was made after preemptive anesthesia of all proposed incisions with 0.5% bupivacaine with 1:200,000 epinephrine. A Verress needle was inserted its correct location was verified. A pneumoperitoneum was created with carbon dioxide. A 5 mm trocar was inserted and video laparoscopy was started with a 30 laparoscope. 2 other 5 mm incisions were made in each lower quadrant and corresponding trochars were inserted  under direct visualization. Ancillary instruments were used through these ports. The scrub technician hold the laparoscope while the surgeon returned to his position between the  patient's legs to operate the hysteroscope. A 5 mm port catheter was passed through the operative channel of the hysteroscope and 18 at the left tubal ostium. We then used a 0.03 inch guidewire to recanalize the proximal left tube. The guidewire was withdrawn and chromotubation was performed showing a selective left chromotubation. The same steps were repeated to recanalize the proximal right tube and as the general chromotubation at failed to show filling of the right tube. The same torque catheter was aimed into the tubal ostium of the right tube and a 0.03 inch guidewire was passed into the proximal right tube. This was observed laparoscopically. We then repeated the selective right chromotubation showing right tubal patency. A ZUMI catheter was inserted into the uterus and its balloon was inflated. General chromotubation was performed showing bilateral tubal patency. We used a laparoscopic needle electrode to ablate the posterior cul-de-sac lesions using 35 W of cutting current. We then performed hydrodissection to elevate the left ovarian fossa lesion from the underlying ureter and made an elliptical incision and carefully dissected the area of peritoneum involved with endometriosis. This was submitted to pathology. Hemostasis was insured. The pelvis was copiously irrigated and aspirated.  The gas was allowed to escape all the instruments were removed instrument and lap pad count were correct. The lower incisions were approximated with 4-0 Monocryl in subcuticular stitches. A 0 Vicryl suture on a UR 6 needle was used to try to approximate the gaping fascia at the umbilical incision.  Patient tolerated the procedure well and was transferred to recovery room in satisfactory condition.  Fermin Schwab, MD

## 2017-04-12 ENCOUNTER — Encounter (HOSPITAL_BASED_OUTPATIENT_CLINIC_OR_DEPARTMENT_OTHER): Payer: Self-pay | Admitting: Obstetrics and Gynecology

## 2018-07-29 ENCOUNTER — Inpatient Hospital Stay (HOSPITAL_COMMUNITY)
Admission: EM | Admit: 2018-07-29 | Discharge: 2018-08-10 | DRG: 957 | Disposition: A | Discharge status: Home / Self Care | Payer: Medicaid Other | Attending: Student | Admitting: Student

## 2018-07-29 ENCOUNTER — Emergency Department (HOSPITAL_COMMUNITY): Payer: Medicaid Other

## 2018-07-29 ENCOUNTER — Inpatient Hospital Stay (HOSPITAL_COMMUNITY): Payer: Medicaid Other

## 2018-07-29 ENCOUNTER — Encounter (HOSPITAL_COMMUNITY): Payer: Self-pay | Admitting: *Deleted

## 2018-07-29 DIAGNOSIS — Z781 Physical restraint status: Secondary | ICD-10-CM

## 2018-07-29 DIAGNOSIS — J9601 Acute respiratory failure with hypoxia: Secondary | ICD-10-CM | POA: Diagnosis not present

## 2018-07-29 DIAGNOSIS — J438 Other emphysema: Secondary | ICD-10-CM | POA: Diagnosis present

## 2018-07-29 DIAGNOSIS — I609 Nontraumatic subarachnoid hemorrhage, unspecified: Secondary | ICD-10-CM

## 2018-07-29 DIAGNOSIS — S32059A Unspecified fracture of fifth lumbar vertebra, initial encounter for closed fracture: Principal | ICD-10-CM | POA: Diagnosis present

## 2018-07-29 DIAGNOSIS — S022XXA Fracture of nasal bones, initial encounter for closed fracture: Secondary | ICD-10-CM | POA: Diagnosis present

## 2018-07-29 DIAGNOSIS — D72829 Elevated white blood cell count, unspecified: Secondary | ICD-10-CM

## 2018-07-29 DIAGNOSIS — D696 Thrombocytopenia, unspecified: Secondary | ICD-10-CM | POA: Diagnosis present

## 2018-07-29 DIAGNOSIS — S3723XA Laceration of bladder, initial encounter: Secondary | ICD-10-CM | POA: Diagnosis present

## 2018-07-29 DIAGNOSIS — D62 Acute posthemorrhagic anemia: Secondary | ICD-10-CM | POA: Diagnosis present

## 2018-07-29 DIAGNOSIS — Y92410 Unspecified street and highway as the place of occurrence of the external cause: Secondary | ICD-10-CM | POA: Diagnosis not present

## 2018-07-29 DIAGNOSIS — R52 Pain, unspecified: Secondary | ICD-10-CM | POA: Diagnosis present

## 2018-07-29 DIAGNOSIS — E8770 Fluid overload, unspecified: Secondary | ICD-10-CM | POA: Diagnosis not present

## 2018-07-29 DIAGNOSIS — Z79899 Other long term (current) drug therapy: Secondary | ICD-10-CM

## 2018-07-29 DIAGNOSIS — I959 Hypotension, unspecified: Secondary | ICD-10-CM | POA: Diagnosis present

## 2018-07-29 DIAGNOSIS — Z23 Encounter for immunization: Secondary | ICD-10-CM

## 2018-07-29 DIAGNOSIS — Z978 Presence of other specified devices: Secondary | ICD-10-CM

## 2018-07-29 DIAGNOSIS — R1310 Dysphagia, unspecified: Secondary | ICD-10-CM

## 2018-07-29 DIAGNOSIS — Z419 Encounter for procedure for purposes other than remedying health state, unspecified: Secondary | ICD-10-CM

## 2018-07-29 DIAGNOSIS — R402362 Coma scale, best motor response, obeys commands, at arrival to emergency department: Secondary | ICD-10-CM | POA: Diagnosis present

## 2018-07-29 DIAGNOSIS — M21931 Unspecified acquired deformity of right forearm: Secondary | ICD-10-CM | POA: Diagnosis present

## 2018-07-29 DIAGNOSIS — E872 Acidosis: Secondary | ICD-10-CM | POA: Diagnosis present

## 2018-07-29 DIAGNOSIS — Z9911 Dependence on respirator [ventilator] status: Secondary | ICD-10-CM

## 2018-07-29 DIAGNOSIS — J9811 Atelectasis: Secondary | ICD-10-CM | POA: Diagnosis not present

## 2018-07-29 DIAGNOSIS — S3729XA Other injury of bladder, initial encounter: Secondary | ICD-10-CM

## 2018-07-29 DIAGNOSIS — S32810A Multiple fractures of pelvis with stable disruption of pelvic ring, initial encounter for closed fracture: Secondary | ICD-10-CM | POA: Diagnosis present

## 2018-07-29 DIAGNOSIS — S0292XA Unspecified fracture of facial bones, initial encounter for closed fracture: Secondary | ICD-10-CM | POA: Diagnosis present

## 2018-07-29 DIAGNOSIS — S32411A Displaced fracture of anterior wall of right acetabulum, initial encounter for closed fracture: Secondary | ICD-10-CM | POA: Diagnosis present

## 2018-07-29 DIAGNOSIS — R402242 Coma scale, best verbal response, confused conversation, at arrival to emergency department: Secondary | ICD-10-CM | POA: Diagnosis present

## 2018-07-29 DIAGNOSIS — S3141XA Laceration without foreign body of vagina and vulva, initial encounter: Secondary | ICD-10-CM | POA: Diagnosis present

## 2018-07-29 DIAGNOSIS — R310 Gross hematuria: Secondary | ICD-10-CM | POA: Diagnosis present

## 2018-07-29 DIAGNOSIS — N3289 Other specified disorders of bladder: Secondary | ICD-10-CM | POA: Diagnosis present

## 2018-07-29 DIAGNOSIS — Y905 Blood alcohol level of 100-119 mg/100 ml: Secondary | ICD-10-CM | POA: Diagnosis present

## 2018-07-29 DIAGNOSIS — R402142 Coma scale, eyes open, spontaneous, at arrival to emergency department: Secondary | ICD-10-CM | POA: Diagnosis present

## 2018-07-29 DIAGNOSIS — S329XXA Fracture of unspecified parts of lumbosacral spine and pelvis, initial encounter for closed fracture: Secondary | ICD-10-CM | POA: Diagnosis present

## 2018-07-29 DIAGNOSIS — F10129 Alcohol abuse with intoxication, unspecified: Secondary | ICD-10-CM | POA: Diagnosis present

## 2018-07-29 DIAGNOSIS — S32431A Displaced fracture of anterior column [iliopubic] of right acetabulum, initial encounter for closed fracture: Secondary | ICD-10-CM | POA: Diagnosis present

## 2018-07-29 DIAGNOSIS — M24831 Other specific joint derangements of right wrist, not elsewhere classified: Secondary | ICD-10-CM

## 2018-07-29 DIAGNOSIS — J690 Pneumonitis due to inhalation of food and vomit: Secondary | ICD-10-CM | POA: Diagnosis present

## 2018-07-29 DIAGNOSIS — S31119A Laceration without foreign body of abdominal wall, unspecified quadrant without penetration into peritoneal cavity, initial encounter: Secondary | ICD-10-CM | POA: Diagnosis present

## 2018-07-29 DIAGNOSIS — S065X9A Traumatic subdural hemorrhage with loss of consciousness of unspecified duration, initial encounter: Secondary | ICD-10-CM

## 2018-07-29 DIAGNOSIS — S3720XA Unspecified injury of bladder, initial encounter: Secondary | ICD-10-CM

## 2018-07-29 DIAGNOSIS — S065XAA Traumatic subdural hemorrhage with loss of consciousness status unknown, initial encounter: Secondary | ICD-10-CM | POA: Diagnosis present

## 2018-07-29 DIAGNOSIS — R443 Hallucinations, unspecified: Secondary | ICD-10-CM | POA: Diagnosis not present

## 2018-07-29 DIAGNOSIS — S3210XA Unspecified fracture of sacrum, initial encounter for closed fracture: Secondary | ICD-10-CM | POA: Diagnosis present

## 2018-07-29 DIAGNOSIS — J9690 Respiratory failure, unspecified, unspecified whether with hypoxia or hypercapnia: Secondary | ICD-10-CM

## 2018-07-29 DIAGNOSIS — I10 Essential (primary) hypertension: Secondary | ICD-10-CM

## 2018-07-29 DIAGNOSIS — T148XXA Other injury of unspecified body region, initial encounter: Secondary | ICD-10-CM

## 2018-07-29 DIAGNOSIS — S329XXB Fracture of unspecified parts of lumbosacral spine and pelvis, initial encounter for open fracture: Secondary | ICD-10-CM

## 2018-07-29 DIAGNOSIS — S06A0XA Traumatic brain compression without herniation, initial encounter: Secondary | ICD-10-CM | POA: Diagnosis present

## 2018-07-29 DIAGNOSIS — S066XAA Traumatic subarachnoid hemorrhage with loss of consciousness status unknown, initial encounter: Secondary | ICD-10-CM | POA: Diagnosis present

## 2018-07-29 DIAGNOSIS — S32592B Other specified fracture of left pubis, initial encounter for open fracture: Secondary | ICD-10-CM

## 2018-07-29 DIAGNOSIS — S0240DA Maxillary fracture, left side, initial encounter for closed fracture: Secondary | ICD-10-CM | POA: Diagnosis present

## 2018-07-29 DIAGNOSIS — G939 Disorder of brain, unspecified: Secondary | ICD-10-CM

## 2018-07-29 DIAGNOSIS — K5900 Constipation, unspecified: Secondary | ICD-10-CM | POA: Diagnosis present

## 2018-07-29 DIAGNOSIS — S062X0S Diffuse traumatic brain injury without loss of consciousness, sequela: Secondary | ICD-10-CM | POA: Diagnosis present

## 2018-07-29 DIAGNOSIS — S066X9A Traumatic subarachnoid hemorrhage with loss of consciousness of unspecified duration, initial encounter: Secondary | ICD-10-CM | POA: Diagnosis present

## 2018-07-29 DIAGNOSIS — S332XXA Dislocation of sacroiliac and sacrococcygeal joint, initial encounter: Secondary | ICD-10-CM

## 2018-07-29 DIAGNOSIS — Z720 Tobacco use: Secondary | ICD-10-CM

## 2018-07-29 DIAGNOSIS — Q0700 Arnold-Chiari syndrome without spina bifida or hydrocephalus: Secondary | ICD-10-CM

## 2018-07-29 DIAGNOSIS — E861 Hypovolemia: Secondary | ICD-10-CM | POA: Diagnosis not present

## 2018-07-29 DIAGNOSIS — F17210 Nicotine dependence, cigarettes, uncomplicated: Secondary | ICD-10-CM | POA: Diagnosis present

## 2018-07-29 DIAGNOSIS — E876 Hypokalemia: Secondary | ICD-10-CM | POA: Diagnosis not present

## 2018-07-29 HISTORY — DX: Endometriosis, unspecified: N80.9

## 2018-07-29 HISTORY — DX: Nicotine dependence, unspecified, uncomplicated: F17.200

## 2018-07-29 LAB — COMPREHENSIVE METABOLIC PANEL
ALT: 41 U/L (ref 0–44)
AST: 85 U/L — ABNORMAL HIGH (ref 15–41)
Albumin: 3.6 g/dL (ref 3.5–5.0)
Alkaline Phosphatase: 62 U/L (ref 38–126)
Anion gap: 15 (ref 5–15)
BUN: 9 mg/dL (ref 6–20)
CO2: 18 mmol/L — ABNORMAL LOW (ref 22–32)
Calcium: 8.1 mg/dL — ABNORMAL LOW (ref 8.9–10.3)
Chloride: 106 mmol/L (ref 98–111)
Creatinine, Ser: 0.94 mg/dL (ref 0.44–1.00)
GFR calc non Af Amer: >60 mL/min (ref >60)
Glucose, Bld: 121 mg/dL — ABNORMAL HIGH (ref 70–99)
Potassium: 3 mmol/L — ABNORMAL LOW (ref 3.5–5.1)
Sodium: 139 mmol/L (ref 135–145)
Total Bilirubin: 0.4 mg/dL (ref 0.3–1.2)
Total Protein: 6.6 g/dL (ref 6.5–8.1)

## 2018-07-29 LAB — I-STAT ARTERIAL BLOOD GAS, ED
Acid-base deficit: 3 mmol/L — ABNORMAL HIGH (ref 0.0–2.0)
Bicarbonate: 23.9 mmol/L (ref 20.0–28.0)
O2 Saturation: 100 %
Patient temperature: 95.4
TCO2: 25 mmol/L (ref 22–32)
pCO2 arterial: 46.6 mmHg (ref 32.0–48.0)
pH, Arterial: 7.309 — ABNORMAL LOW (ref 7.350–7.450)
pO2, Arterial: 333 mmHg — ABNORMAL HIGH (ref 83.0–108.0)

## 2018-07-29 LAB — I-STAT CHEM 8, ED
BUN: 9 mg/dL (ref 6–20)
CREATININE: 1.1 mg/dL — AB (ref 0.44–1.00)
Calcium, Ion: 1.04 mmol/L — ABNORMAL LOW (ref 1.15–1.40)
Chloride: 103 mmol/L (ref 98–111)
GLUCOSE: 114 mg/dL — AB (ref 70–99)
HCT: 36 % (ref 36.0–46.0)
Hemoglobin: 12.2 g/dL (ref 12.0–15.0)
Potassium: 3 mmol/L — ABNORMAL LOW (ref 3.5–5.1)
Sodium: 140 mmol/L (ref 135–145)
TCO2: 23 mmol/L (ref 22–32)

## 2018-07-29 LAB — CBC
HCT: 37.6 % (ref 36.0–46.0)
Hemoglobin: 11.9 g/dL — ABNORMAL LOW (ref 12.0–15.0)
MCH: 31.2 pg (ref 26.0–34.0)
MCHC: 31.6 g/dL (ref 30.0–36.0)
MCV: 98.7 fL (ref 80.0–100.0)
PLATELETS: 340 10*3/uL (ref 150–400)
RBC: 3.81 MIL/uL — ABNORMAL LOW (ref 3.87–5.11)
RDW: 12.6 % (ref 11.5–15.5)
WBC: 19.5 10*3/uL — ABNORMAL HIGH (ref 4.0–10.5)
nRBC: 0 % (ref 0.0–0.2)

## 2018-07-29 LAB — TRIGLYCERIDES: Triglycerides: 146 mg/dL (ref <150)

## 2018-07-29 LAB — PROTIME-INR
INR: 1.08
Prothrombin Time: 13.9 seconds (ref 11.4–15.2)

## 2018-07-29 LAB — ETHANOL: Alcohol, Ethyl (B): 107 mg/dL — ABNORMAL HIGH (ref <10)

## 2018-07-29 LAB — I-STAT CG4 LACTIC ACID, ED: Lactic Acid, Venous: 3.61 mmol/L (ref 0.5–1.9)

## 2018-07-29 LAB — CDS SEROLOGY

## 2018-07-29 MED ORDER — SUCCINYLCHOLINE CHLORIDE 20 MG/ML IJ SOLN
INTRAMUSCULAR | Status: AC | PRN
  Administered 2018-07-29: 100 mg via INTRAVENOUS

## 2018-07-29 MED ORDER — SODIUM CHLORIDE 0.9 % IV SOLN: 1.0000 g | INTRAVENOUS | Status: DC

## 2018-07-29 MED ORDER — FENTANYL CITRATE (PF) 100 MCG/2ML IJ SOLN
INTRAMUSCULAR | Status: AC
  Filled 2018-07-29: qty 2

## 2018-07-29 MED ORDER — FENTANYL BOLUS VIA INFUSION
50.0000 ug | INTRAVENOUS | Status: DC | PRN
  Administered 2018-08-01 – 2018-08-02 (×3): 50 ug via INTRAVENOUS
  Filled 2018-07-29: qty 50

## 2018-07-29 MED ORDER — SODIUM CHLORIDE 0.9 % IV SOLN
2.0000 g | Freq: Once | INTRAVENOUS | Status: AC
  Administered 2018-07-30: 2 g via INTRAVENOUS
  Filled 2018-07-29: qty 20

## 2018-07-29 MED ORDER — IOHEXOL 300 MG/ML  SOLN
100.0000 mL | Freq: Once | INTRAMUSCULAR | Status: AC | PRN
  Administered 2018-07-29: 100 mL via INTRAVENOUS

## 2018-07-29 MED ORDER — PROPOFOL 1000 MG/100ML IV EMUL
5.0000 ug/kg/min | INTRAVENOUS | Status: DC
  Administered 2018-07-29: 10 ug/kg/min via INTRAVENOUS
  Administered 2018-07-30 (×4): 70 ug/kg/min via INTRAVENOUS
  Administered 2018-07-31: 20 ug/kg/min via INTRAVENOUS
  Filled 2018-07-29 (×4): qty 100

## 2018-07-29 MED ORDER — FENTANYL 2500MCG IN NS 250ML (10MCG/ML) PREMIX INFUSION
0.0000 ug/h | INTRAVENOUS | Status: DC
  Administered 2018-07-29: 50 ug/h via INTRAVENOUS
  Administered 2018-07-30 – 2018-08-02 (×8): 200 ug/h via INTRAVENOUS
  Administered 2018-08-03: 100 ug/h via INTRAVENOUS
  Filled 2018-07-29 (×10): qty 250

## 2018-07-29 MED ORDER — FENTANYL 2500MCG IN NS 250ML (10MCG/ML) PREMIX INFUSION: 25.0000 ug/h | INTRAVENOUS | Status: DC

## 2018-07-29 MED ORDER — FENTANYL CITRATE (PF) 100 MCG/2ML IJ SOLN
50.0000 ug | Freq: Once | INTRAMUSCULAR | Status: AC
  Administered 2018-07-29: 50 ug via INTRAVENOUS

## 2018-07-29 MED ORDER — ETOMIDATE 2 MG/ML IV SOLN
INTRAVENOUS | Status: AC | PRN
  Administered 2018-07-29: 25 mg via INTRAVENOUS

## 2018-07-29 MED ORDER — SODIUM CHLORIDE 0.9 % IV SOLN
INTRAVENOUS | Status: AC | PRN
  Administered 2018-07-29: 150 mL/h via INTRAVENOUS

## 2018-07-29 MED ORDER — LIDOCAINE HCL (PF) 1 % IJ SOLN
30.0000 mL | Freq: Once | INTRAMUSCULAR | Status: AC
  Administered 2018-07-29: 30 mL
  Filled 2018-07-29: qty 30

## 2018-07-29 MED ORDER — DOCUSATE SODIUM 50 MG/5ML PO LIQD
100.0000 mg | Freq: Two times a day (BID) | ORAL | Status: DC | PRN
  Administered 2018-08-03: 100 mg
  Filled 2018-07-29 (×2): qty 10

## 2018-07-29 MED ORDER — FENTANYL CITRATE (PF) 100 MCG/2ML IJ SOLN
INTRAMUSCULAR | Status: AC | PRN
  Administered 2018-07-29: 50 ug via INTRAVENOUS
  Administered 2018-07-29: 100 ug via INTRAVENOUS

## 2018-07-29 MED ORDER — TETANUS-DIPHTH-ACELL PERTUSSIS 5-2.5-18.5 LF-MCG/0.5 IM SUSP
0.5000 mL | Freq: Once | INTRAMUSCULAR | Status: AC
  Administered 2018-07-29: 0.5 mL via INTRAMUSCULAR
  Filled 2018-07-29: qty 0.5

## 2018-07-29 MED ORDER — PROPOFOL 1000 MG/100ML IV EMUL
INTRAVENOUS | Status: AC
  Filled 2018-07-29: qty 100

## 2018-07-29 MED ORDER — FENTANYL CITRATE (PF) 100 MCG/2ML IJ SOLN
INTRAMUSCULAR | Status: AC | PRN
  Administered 2018-07-29: 100 ug via INTRAVENOUS

## 2018-07-29 MED ORDER — PROPOFOL 1000 MG/100ML IV EMUL
0.0000 ug/kg/min | INTRAVENOUS | Status: DC
  Administered 2018-07-31 (×2): 25 ug/kg/min via INTRAVENOUS
  Administered 2018-08-01: 24.793 ug/kg/min via INTRAVENOUS
  Administered 2018-08-01 – 2018-08-02 (×2): 25 ug/kg/min via INTRAVENOUS
  Administered 2018-08-03: 15 ug/kg/min via INTRAVENOUS
  Filled 2018-07-29 (×10): qty 100

## 2018-07-29 MED ORDER — FENTANYL CITRATE (PF) 100 MCG/2ML IJ SOLN
INTRAMUSCULAR | Status: AC | PRN
  Administered 2018-07-29: 50 ug via INTRAVENOUS
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
## 2018-07-29 NOTE — ED Notes (Signed)
Contacted Chaplain Elliot Gurney([facility]) to request taking patient's family up to [facility] @ [phone]

## 2018-07-30 ENCOUNTER — Inpatient Hospital Stay (HOSPITAL_COMMUNITY): Payer: Medicaid Other | Admitting: Anesthesiology

## 2018-07-30 ENCOUNTER — Encounter (HOSPITAL_COMMUNITY): Payer: Self-pay | Admitting: Certified Registered"

## 2018-07-30 ENCOUNTER — Inpatient Hospital Stay (HOSPITAL_COMMUNITY): Payer: Medicaid Other

## 2018-07-30 ENCOUNTER — Encounter (HOSPITAL_COMMUNITY): Admission: EM | Disposition: A | Discharge status: Home / Self Care | Payer: Self-pay | Source: Home / Self Care

## 2018-07-30 HISTORY — PX: BLADDER REPAIR: SHX6721

## 2018-07-30 HISTORY — PX: ORIF PELVIC FRACTURE WITH PERCUTANEOUS SCREWS: SHX6800

## 2018-07-30 LAB — PREPARE FRESH FROZEN PLASMA
Unit division: 0
Unit division: 0
Unit division: 0
Unit division: 0
Unit division: 0
Unit division: 0
Unit division: 0
Unit division: 0
Unit division: 0

## 2018-07-30 LAB — BPAM FFP
BLOOD PRODUCT EXPIRATION DATE: 201912182359
BLOOD PRODUCT EXPIRATION DATE: 201912182359
BLOOD PRODUCT EXPIRATION DATE: 201912202359
BLOOD PRODUCT EXPIRATION DATE: 201912202359
BLOOD PRODUCT EXPIRATION DATE: 202001042359
Blood Product Expiration Date: 201912182359
Blood Product Expiration Date: 201912182359
Blood Product Expiration Date: 201912182359
Blood Product Expiration Date: 201912182359
Blood Product Expiration Date: 201912202359
Blood Product Expiration Date: 201912202359
Blood Product Expiration Date: 201912202359
Blood Product Expiration Date: 201912202359
Blood Product Expiration Date: 201912292359
Blood Product Expiration Date: 201912302359
Blood Product Expiration Date: 202001042359
ISSUE DATE / TIME: 201912151915
ISSUE DATE / TIME: 201912151952
ISSUE DATE / TIME: 201912151952
ISSUE DATE / TIME: 201912152002
ISSUE DATE / TIME: 201912152002
ISSUE DATE / TIME: 201912152017
ISSUE DATE / TIME: 201912152017
ISSUE DATE / TIME: 201912152034
ISSUE DATE / TIME: 201912152034
ISSUE DATE / TIME: 201912152049
ISSUE DATE / TIME: 201912152049
ISSUE DATE / TIME: 201912151915
Unit Type and Rh: 2800
Unit Type and Rh: 600
Unit Type and Rh: 600
Unit Type and Rh: 6200
Unit Type and Rh: 6200
Unit Type and Rh: 6200
Unit Type and Rh: 6200
Unit Type and Rh: 6200
Unit Type and Rh: 6200
Unit Type and Rh: 6200
Unit Type and Rh: 6200
Unit Type and Rh: 8400
Unit Type and Rh: 8400
Unit Type and Rh: 8400
Unit Type and Rh: 8400
Unit Type and Rh: 8400

## 2018-07-30 LAB — HIV ANTIBODY (ROUTINE TESTING W REFLEX): HIV Screen 4th Generation wRfx: NONREACTIVE

## 2018-07-30 LAB — BPAM PLATELET PHERESIS
Blood Product Expiration Date: 201912152359
Blood Product Expiration Date: 201912152359
ISSUE DATE / TIME: 201912152023
ISSUE DATE / TIME: 201912152027
Unit Type and Rh: 7300
Unit Type and Rh: 8400

## 2018-07-30 LAB — BASIC METABOLIC PANEL
Anion gap: 16 — ABNORMAL HIGH (ref 5–15)
BUN: 9 mg/dL (ref 6–20)
CHLORIDE: 106 mmol/L (ref 98–111)
CO2: 21 mmol/L — ABNORMAL LOW (ref 22–32)
Calcium: 7.2 mg/dL — ABNORMAL LOW (ref 8.9–10.3)
Creatinine, Ser: 0.72 mg/dL (ref 0.44–1.00)
GFR calc Af Amer: >60 mL/min (ref >60)
GFR calc non Af Amer: >60 mL/min (ref >60)
Glucose, Bld: 78 mg/dL (ref 70–99)
POTASSIUM: 3.4 mmol/L — AB (ref 3.5–5.1)
SODIUM: 143 mmol/L (ref 135–145)

## 2018-07-30 LAB — BPAM CRYOPRECIPITATE
Blood Product Expiration Date: 201912160239
Unit Type and Rh: 6200

## 2018-07-30 LAB — CBC
HCT: 32.6 % — ABNORMAL LOW (ref 36.0–46.0)
Hemoglobin: 11.1 g/dL — ABNORMAL LOW (ref 12.0–15.0)
MCH: 30.6 pg (ref 26.0–34.0)
MCHC: 34 g/dL (ref 30.0–36.0)
MCV: 89.8 fL (ref 80.0–100.0)
Platelets: 106 10*3/uL — ABNORMAL LOW (ref 150–400)
RBC: 3.63 MIL/uL — ABNORMAL LOW (ref 3.87–5.11)
RDW: 15.3 % (ref 11.5–15.5)
WBC: 6.4 10*3/uL (ref 4.0–10.5)
nRBC: 0 % (ref 0.0–0.2)

## 2018-07-30 LAB — PREPARE CRYOPRECIPITATE: Unit division: 0

## 2018-07-30 LAB — PROTIME-INR
INR: 1.13
Prothrombin Time: 14.4 seconds (ref 11.4–15.2)

## 2018-07-30 LAB — LACTIC ACID, PLASMA: Lactic Acid, Venous: 3 mmol/L (ref 0.5–1.9)

## 2018-07-30 LAB — PREPARE PLATELET PHERESIS
Unit division: 0
Unit division: 0

## 2018-07-30 LAB — ABO/RH: ABO/RH(D): AB POS

## 2018-07-30 LAB — PREPARE RBC (CROSSMATCH)

## 2018-07-30 LAB — MASSIVE TRANSFUSION PROTOCOL ORDER (BLOOD BANK NOTIFICATION)

## 2018-07-30 SURGERY — CLOSED REDUCTION, PELVIS, WITH PERCUTANEOUS FIXATION
Anesthesia: General | Site: Bladder

## 2018-07-30 MED ORDER — SODIUM CHLORIDE 0.9 % IV SOLN
INTRAVENOUS | Status: DC
  Administered 2018-07-30: 1000 mL via INTRAVENOUS
  Administered 2018-07-30 – 2018-08-02 (×5): via INTRAVENOUS
  Administered 2018-08-03: 250 mL via INTRAVENOUS

## 2018-07-30 MED ORDER — PHENYLEPHRINE 40 MCG/ML (10ML) SYRINGE FOR IV PUSH (FOR BLOOD PRESSURE SUPPORT)
PREFILLED_SYRINGE | INTRAVENOUS | Status: AC
  Filled 2018-07-30: qty 10

## 2018-07-30 MED ORDER — CHLORHEXIDINE GLUCONATE 0.12% ORAL RINSE (MEDLINE KIT)
15.0000 mL | Freq: Two times a day (BID) | OROMUCOSAL | Status: DC
  Administered 2018-07-30 – 2018-08-05 (×13): 15 mL via OROMUCOSAL
  Filled 2018-07-30 (×2): qty 15

## 2018-07-30 MED ORDER — DEXAMETHASONE SODIUM PHOSPHATE 10 MG/ML IJ SOLN
INTRAMUSCULAR | Status: DC | PRN
  Administered 2018-07-30: 4 mg via INTRAVENOUS

## 2018-07-30 MED ORDER — SODIUM CHLORIDE 0.9 % IV SOLN
2.0000 g | INTRAVENOUS | Status: AC
  Administered 2018-07-31 – 2018-08-02 (×3): 2 g via INTRAVENOUS
  Filled 2018-07-30 (×3): qty 20

## 2018-07-30 MED ORDER — ALBUMIN HUMAN 5 % IV SOLN
25.0000 g | Freq: Once | INTRAVENOUS | Status: AC
  Administered 2018-07-30: 25 g via INTRAVENOUS
  Filled 2018-07-30: qty 250

## 2018-07-30 MED ORDER — PROPOFOL 10 MG/ML IV BOLUS
INTRAVENOUS | Status: AC
  Filled 2018-07-30: qty 20

## 2018-07-30 MED ORDER — ONDANSETRON 4 MG PO TBDP
4.0000 mg | ORAL_TABLET | Freq: Four times a day (QID) | ORAL | Status: DC | PRN
  Administered 2018-08-04: 4 mg via ORAL
  Filled 2018-07-30: qty 1

## 2018-07-30 MED ORDER — ROCURONIUM BROMIDE 50 MG/5ML IV SOSY
PREFILLED_SYRINGE | INTRAVENOUS | Status: AC
  Filled 2018-07-30: qty 10

## 2018-07-30 MED ORDER — ACETAMINOPHEN 325 MG PO TABS
650.0000 mg | ORAL_TABLET | ORAL | Status: DC | PRN
  Administered 2018-07-30: 650 mg via ORAL
  Filled 2018-07-30: qty 2

## 2018-07-30 MED ORDER — ONDANSETRON HCL 4 MG/2ML IJ SOLN
4.0000 mg | Freq: Four times a day (QID) | INTRAMUSCULAR | Status: DC | PRN
  Administered 2018-08-03 – 2018-08-05 (×2): 4 mg via INTRAVENOUS
  Filled 2018-07-30 (×2): qty 2

## 2018-07-30 MED ORDER — POTASSIUM CHLORIDE 10 MEQ/100ML IV SOLN
10.0000 meq | INTRAVENOUS | Status: AC
  Administered 2018-07-30 (×2): 10 meq via INTRAVENOUS
  Filled 2018-07-30 (×2): qty 100

## 2018-07-30 MED ORDER — TOBRAMYCIN SULFATE 1.2 G IJ SOLR
INTRAMUSCULAR | Status: DC | PRN
  Administered 2018-07-30: 1.2 g

## 2018-07-30 MED ORDER — EPHEDRINE 5 MG/ML INJ
INTRAVENOUS | Status: AC
  Filled 2018-07-30: qty 10

## 2018-07-30 MED ORDER — LACTATED RINGERS IV SOLN
INTRAVENOUS | Status: DC | PRN
  Administered 2018-07-30: 13:00:00 via INTRAVENOUS

## 2018-07-30 MED ORDER — VANCOMYCIN HCL 1000 MG IV SOLR
INTRAVENOUS | Status: AC
  Filled 2018-07-30: qty 1000

## 2018-07-30 MED ORDER — PANTOPRAZOLE SODIUM 40 MG IV SOLR
40.0000 mg | Freq: Every day | INTRAVENOUS | Status: DC
  Administered 2018-07-30 – 2018-08-04 (×6): 40 mg via INTRAVENOUS
  Filled 2018-07-30 (×6): qty 40

## 2018-07-30 MED ORDER — LACTATED RINGERS IV BOLUS
1000.0000 mL | Freq: Once | INTRAVENOUS | Status: AC
  Administered 2018-07-30: 1000 mL via INTRAVENOUS

## 2018-07-30 MED ORDER — SODIUM CHLORIDE 0.9 % IR SOLN
Status: DC | PRN
  Administered 2018-07-30: 3000 mL

## 2018-07-30 MED ORDER — ONDANSETRON HCL 4 MG/2ML IJ SOLN
INTRAMUSCULAR | Status: AC
  Filled 2018-07-30: qty 2

## 2018-07-30 MED ORDER — FENTANYL CITRATE (PF) 250 MCG/5ML IJ SOLN
INTRAMUSCULAR | Status: AC
  Filled 2018-07-30: qty 5

## 2018-07-30 MED ORDER — MIDAZOLAM HCL 2 MG/2ML IJ SOLN
INTRAMUSCULAR | Status: AC
  Filled 2018-07-30: qty 2

## 2018-07-30 MED ORDER — PHENYLEPHRINE 40 MCG/ML (10ML) SYRINGE FOR IV PUSH (FOR BLOOD PRESSURE SUPPORT)
PREFILLED_SYRINGE | INTRAVENOUS | Status: DC | PRN
  Administered 2018-07-30: 80 ug via INTRAVENOUS
  Administered 2018-07-30: 120 ug via INTRAVENOUS

## 2018-07-30 MED ORDER — TOBRAMYCIN SULFATE 1.2 G IJ SOLR
INTRAMUSCULAR | Status: AC
  Filled 2018-07-30: qty 1.2

## 2018-07-30 MED ORDER — EPHEDRINE SULFATE-NACL 50-0.9 MG/10ML-% IV SOSY
PREFILLED_SYRINGE | INTRAVENOUS | Status: DC | PRN
  Administered 2018-07-30: 10 mg via INTRAVENOUS

## 2018-07-30 MED ORDER — DEXAMETHASONE SODIUM PHOSPHATE 10 MG/ML IJ SOLN
INTRAMUSCULAR | Status: AC
  Filled 2018-07-30: qty 1

## 2018-07-30 MED ORDER — ONDANSETRON HCL 4 MG/2ML IJ SOLN
INTRAMUSCULAR | Status: DC | PRN
  Administered 2018-07-30: 4 mg via INTRAVENOUS

## 2018-07-30 MED ORDER — PROPOFOL 1000 MG/100ML IV EMUL
INTRAVENOUS | Status: AC
  Filled 2018-07-30: qty 100

## 2018-07-30 MED ORDER — ROCURONIUM BROMIDE 10 MG/ML (PF) SYRINGE
PREFILLED_SYRINGE | INTRAVENOUS | Status: DC | PRN
  Administered 2018-07-30: 20 mg via INTRAVENOUS
  Administered 2018-07-30 (×2): 50 mg via INTRAVENOUS

## 2018-07-30 MED ORDER — ORAL CARE MOUTH RINSE
15.0000 mL | OROMUCOSAL | Status: DC
  Administered 2018-07-30 – 2018-08-03 (×42): 15 mL via OROMUCOSAL

## 2018-07-30 MED ORDER — CEFAZOLIN SODIUM-DEXTROSE 2-4 GM/100ML-% IV SOLN
2.0000 g | Freq: Three times a day (TID) | INTRAVENOUS | Status: DC
  Administered 2018-07-30 (×2): 2 g via INTRAVENOUS
  Filled 2018-07-30 (×4): qty 100

## 2018-07-30 MED ORDER — METRONIDAZOLE IN NACL 5-0.79 MG/ML-% IV SOLN
500.0000 mg | Freq: Three times a day (TID) | INTRAVENOUS | Status: AC
  Administered 2018-07-30 – 2018-08-02 (×9): 500 mg via INTRAVENOUS
  Filled 2018-07-30 (×9): qty 100

## 2018-07-30 MED ORDER — PANTOPRAZOLE SODIUM 40 MG PO TBEC
40.0000 mg | DELAYED_RELEASE_TABLET | Freq: Every day | ORAL | Status: DC
  Administered 2018-08-05 – 2018-08-10 (×6): 40 mg via ORAL
  Filled 2018-07-30 (×6): qty 1

## 2018-07-30 MED ORDER — ROCURONIUM BROMIDE 50 MG/5ML IV SOSY
PREFILLED_SYRINGE | INTRAVENOUS | Status: AC
  Filled 2018-07-30: qty 5

## 2018-07-30 MED ORDER — STERILE WATER FOR IRRIGATION IR SOLN
Status: DC | PRN
  Administered 2018-07-30: 1000 mL

## 2018-07-30 MED ORDER — VANCOMYCIN HCL 1000 MG IV SOLR
INTRAVENOUS | Status: DC | PRN
  Administered 2018-07-30: 1000 mg

## 2018-07-30 SURGICAL SUPPLY — 76 items
11x250mm ex-fix bars ×8 IMPLANT
5.0x250mm shantz pin ×8 IMPLANT
BAG URIMETER 350ML BARDEX IC (UROLOGICAL SUPPLIES) ×1
BAG URIMETER BARDEX IC 350 (UROLOGICAL SUPPLIES) ×3 IMPLANT
BIT DRILL 2.5X300 (BIT) ×2 IMPLANT
BIT DRILL CANN 4.5MM (BIT) ×4 IMPLANT
BLADE 11 SAFETY STRL DISP (BLADE) ×4 IMPLANT
BLADE CLIPPER SURG (BLADE) IMPLANT
BLADE SURG 11 STRL SS (BLADE) ×4 IMPLANT
BNDG GAUZE ELAST 4 BULKY (GAUZE/BANDAGES/DRESSINGS) ×4 IMPLANT
CATH HEMA 3WAY 30CC 22FR COUDE (CATHETERS) ×4 IMPLANT
CHLORAPREP W/TINT 26ML (MISCELLANEOUS) ×4 IMPLANT
CLAMP LG COMBINATION (Clamp) ×12 IMPLANT
COVER WAND RF STERILE (DRAPES) IMPLANT
DERMABOND ADVANCED (GAUZE/BANDAGES/DRESSINGS)
DERMABOND ADVANCED .7 DNX12 (GAUZE/BANDAGES/DRESSINGS) IMPLANT
DRAIN CHANNEL 19F RND (DRAIN) ×4 IMPLANT
DRAPE C-ARM 42X72 X-RAY (DRAPES) ×4 IMPLANT
DRAPE C-ARMOR (DRAPES) ×4 IMPLANT
DRAPE INCISE IOBAN 66X45 STRL (DRAPES) ×4 IMPLANT
DRAPE PROXIMA HALF (DRAPES) ×4 IMPLANT
DRAPE SURG 17X23 STRL (DRAPES) ×24 IMPLANT
DRAPE U-SHAPE 47X51 STRL (DRAPES) ×4 IMPLANT
DRAPE UNIVERSAL PACK (DRAPES) ×4 IMPLANT
DRILL BIT 2.5X300 (BIT) ×2
DRILL BIT CANN 4.5MM (BIT) ×4
DRSG MEPILEX BORDER 4X4 (GAUZE/BANDAGES/DRESSINGS) IMPLANT
DRSG MEPILEX BORDER 4X8 (GAUZE/BANDAGES/DRESSINGS) ×4 IMPLANT
ELECT BLADE 6.5 EXT (BLADE) ×4 IMPLANT
ELECT REM PT RETURN 9FT ADLT (ELECTROSURGICAL) ×4
ELECTRODE REM PT RTRN 9FT ADLT (ELECTROSURGICAL) ×2 IMPLANT
EVACUATOR SILICONE 100CC (DRAIN) ×4 IMPLANT
GAUZE SPONGE 4X4 12PLY STRL (GAUZE/BANDAGES/DRESSINGS) ×4 IMPLANT
GAUZE SPONGE 4X4 16PLY XRAY LF (GAUZE/BANDAGES/DRESSINGS) ×4 IMPLANT
GLOVE BIO SURGEON STRL SZ 6.5 (GLOVE) ×9 IMPLANT
GLOVE BIO SURGEON STRL SZ7.5 (GLOVE) ×16 IMPLANT
GLOVE BIO SURGEONS STRL SZ 6.5 (GLOVE) ×3
GLOVE BIOGEL PI IND STRL 6.5 (GLOVE) ×2 IMPLANT
GLOVE BIOGEL PI IND STRL 7.5 (GLOVE) ×2 IMPLANT
GLOVE BIOGEL PI INDICATOR 6.5 (GLOVE) ×2
GLOVE BIOGEL PI INDICATOR 7.5 (GLOVE) ×2
GOWN STRL REUS W/ TWL LRG LVL3 (GOWN DISPOSABLE) ×4 IMPLANT
GOWN STRL REUS W/TWL LRG LVL3 (GOWN DISPOSABLE) ×4
GUIDEWIRE 2.0MM (WIRE) ×12 IMPLANT
GUIDEWIRE THREADED 2.8MM (WIRE) ×8 IMPLANT
HANDPIECE INTERPULSE COAX TIP (DISPOSABLE) ×2
KIT BASIN OR (CUSTOM PROCEDURE TRAY) ×4 IMPLANT
KIT TURNOVER KIT B (KITS) ×4 IMPLANT
MANIFOLD NEPTUNE II (INSTRUMENTS) ×4 IMPLANT
NS IRRIG 1000ML POUR BTL (IV SOLUTION) ×4 IMPLANT
PACK TOTAL JOINT (CUSTOM PROCEDURE TRAY) ×4 IMPLANT
PAD ARMBOARD 7.5X6 YLW CONV (MISCELLANEOUS) ×8 IMPLANT
PLUG CATH AND CAP STER (CATHETERS) ×4 IMPLANT
RETRACTOR YANK SUCT EIGR SABER (INSTRUMENTS) ×4 IMPLANT
ROD CRBN FBR LRG EX-FX 11X250 (Rod) ×8 IMPLANT
SCREW SHANZ 5X250MM (Screw) ×4 IMPLANT
SET HNDPC FAN SPRY TIP SCT (DISPOSABLE) ×2 IMPLANT
SPONGE LAP 18X18 X RAY DECT (DISPOSABLE) IMPLANT
STAPLER VISISTAT 35W (STAPLE) ×4 IMPLANT
SUCTION FRAZIER HANDLE 10FR (MISCELLANEOUS) ×2
SUCTION TUBE FRAZIER 10FR DISP (MISCELLANEOUS) ×2 IMPLANT
SUT ETHILON 2 0 FS 18 (SUTURE) ×12 IMPLANT
SUT ETHILON 3 0 PS 1 (SUTURE) ×4 IMPLANT
SUT MNCRL AB 3-0 PS2 18 (SUTURE) ×8 IMPLANT
SUT MON AB 2-0 CT1 36 (SUTURE) ×8 IMPLANT
SUT PDS AB 0 CT 36 (SUTURE) ×4 IMPLANT
SUT VIC AB 2-0 CT3 27 (SUTURE) ×32 IMPLANT
SYR 30ML SLIP (SYRINGE) ×4 IMPLANT
SYRINGE IRR TOOMEY STRL 70CC (SYRINGE) ×8 IMPLANT
TAPE CLOTH SURG 4X10 WHT LF (GAUZE/BANDAGES/DRESSINGS) ×4 IMPLANT
TRAY FOLEY MTR SLVR 16FR STAT (SET/KITS/TRAYS/PACK) IMPLANT
TUBE CONNECTING 12'X1/4 (SUCTIONS) ×1
TUBE CONNECTING 12X1/4 (SUCTIONS) ×3 IMPLANT
TUBE FEEDING ENTERAL 5FR 16IN (TUBING) ×4 IMPLANT
WATER STERILE IRR 1000ML POUR (IV SOLUTION) ×4 IMPLANT
large combi clamp ×12 IMPLANT

## 2018-07-30 NOTE — Transfer of Care (Signed)
Immediate Anesthesia Transfer of Care Note  Patient: Olivia Melendez  Procedure(s) Performed: I&D and EXTERNAL FIXATION OF PELVIS (N/A ) OPEN CYSTOTOMY REPAIR (Bladder)  Patient Location: ICU  Anesthesia Type:General  Level of Consciousness: unresponsive and Patient remains intubated per anesthesia plan  Airway & Oxygen Therapy: Patient remains intubated per anesthesia plan and Patient placed on Ventilator (see vital sign flow sheet for setting)  Post-op Assessment: Report given to RN, Post -op Vital signs reviewed and stable and Patient moving all extremities  Post vital signs: Reviewed and stable  Last Vitals:  Vitals Value Taken Time  BP 99/55 07/30/2018  4:32 PM  Temp    Pulse 80 07/30/2018  4:33 PM  Resp 6 07/30/2018  4:33 PM  SpO2 100 % 07/30/2018  4:33 PM  Vitals shown include unvalidated device data.  Last Pain:  Vitals:   07/30/18 0015  TempSrc: Bladder  PainSc:          Complications: No apparent anesthesia complications

## 2018-07-30 NOTE — Progress Notes (Signed)
Pt arrived back from OR and placed on full vent support.
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
## 2018-07-30 NOTE — Progress Notes (Signed)
Pt continues to have poor urine output despite 1 liter bolus of LR.  Bed pad and feminine pad noted to be saturated with red tinted fluid. Will continue to monitor closely.
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
## 2018-07-30 NOTE — Anesthesia Preprocedure Evaluation (Addendum)
Anesthesia Evaluation  Patient identified by MRN, date of birth, ID band Patient awake    Reviewed: Allergy & Precautions, H&P , NPO status , Patient's Chart, lab work & pertinent test results, reviewed documented beta blocker date and time   Airway Mallampati: II  TM Distance: >3 FB Neck ROM: full    Dental no notable dental hx.    Pulmonary Current Smoker,    Pulmonary exam normal breath sounds clear to auscultation       Cardiovascular Exercise Tolerance: Good hypertension,  Rhythm:regular Rate:Normal     Neuro/Psych CT head  IMPRESSION: 1. Small left greater than right cerebral subarachnoid hemorrhage. 2. 7 mm left sided subdural hematoma at the vertex. 3. 3 mm left-to-right midline shift.  No herniation. 4. Fractures of the bilateral nasal bones and left anterior maxillary sinus wall. 5. No acute cervical spine fracture.  negative neurological ROS  negative psych ROS   GI/Hepatic negative GI ROS, Neg liver ROS,   Endo/Other  negative endocrine ROS  Renal/GU negative Renal ROS  negative genitourinary   Musculoskeletal   Abdominal   Peds  Hematology negative hematology ROS (+)   Anesthesia Other Findings   Reproductive/Obstetrics negative OB ROS                           Anesthesia Physical Anesthesia Plan  ASA: III  Anesthesia Plan: General   Post-op Pain Management:    Induction: Intravenous  PONV Risk Score and Plan: 3 and Ondansetron and Treatment may vary due to age or medical condition  Airway Management Planned: Oral ETT  Additional Equipment:   Intra-op Plan:   Post-operative Plan: Possible Post-op intubation/ventilation  Informed Consent: I have reviewed the patients History and Physical, chart, labs and discussed the procedure including the risks, benefits and alternatives for the proposed anesthesia with the patient or authorized representative who has  indicated his/her understanding and acceptance.     Plan Discussed with: CRNA, Anesthesiologist and Surgeon  Anesthesia Plan Comments: (  )      Anesthesia Quick Evaluation

## 2018-07-30 NOTE — Progress Notes (Signed)
Follow-up head CT scan with continued presence of bilateral diffuse posttraumatic subarachnoid hemorrhage.  No evidence of significant contusion.  Still with some left hemispheric edema.  Basilar cisterns widely patent.  Continue supportive efforts.  No indication for intracranial pressure monitoring or more aggressive care.
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
## 2018-07-30 NOTE — Progress Notes (Signed)
Pt transported on vent to CT and returned to 4N17 without complications. 

## 2018-07-30 NOTE — Progress Notes (Signed)
Patient remains intubated and heavily sedated.  She will intermittently open eyes and is following some simple commands.  Plan to have her go to the operating room for treatment of her open pelvic fracture.  Plan for follow-up head CT scan after surgery.

## 2018-07-30 NOTE — Progress Notes (Addendum)
Patient ID: Olivia BaileyStephanie Corsino, female   DOB: Dec 17, 1980, 37 y.o.   MRN: [id number] Follow up - Trauma Critical Care  Patient Details:    Olivia Melendez is an 37 y.o. female.  Lines/tubes : Airway 7.5 mm (Active)  Secured at (cm) 23 cm 07/30/2018  7:24 AM  Measured From Lips 07/30/2018  7:24 AM  Secured Location Left 07/30/2018  7:24 AM  Secured By [employer] T[employer] 07/30/2018  7:24 AM  Tube Holder Repositioned Yes 07/30/2018  7:24 AM  Site Condition Dry;Other (Comment) 07/30/2018  7:24 AM     NG/OG Tube Orogastric 18 Fr. Center mouth Aucultation (Active)  Site Assessment Clean;Dry;Intact 07/30/2018 12:15 AM  Ongoing Placement Verification No change in cm markings or external length of tube from initial placement;No acute changes, not attributed to clinical condition;No change in respiratory status 07/30/2018 12:15 AM  Amount of suction 90 mmHg 07/30/2018 12:15 AM  Drainage Appearance Green 07/30/2018 12:15 AM     Urethral Catheter Dr.Gessner Temperature probe 14 Fr. (Active)  Indication for Insertion or Continuance of Catheter Bladder outlet obstruction / other urologic reason 07/30/2018  8:00 AM  Site Assessment Intact 07/30/2018 12:15 AM  Catheter Maintenance Bag below level of bladder;Catheter secured;Drainage bag/tubing not touching floor;Insertion date on drainage bag;No dependent loops;Seal intact 07/30/2018  8:00 AM  Output (mL) 18 mL 07/30/2018  6:00 AM    Microbiology/Sepsis markers: No results found for this or any previous visit.  Anti-infectives:  Anti-infectives (From admission, onward)   Start     Dose/Rate Route Frequency Ordered Stop   07/30/18 0915  cefTRIAXone (ROCEPHIN) 2 g in sodium chloride 0.9 % 100 mL IVPB     2 g 200 mL/hr over 30 Minutes Intravenous Every 24 hours 07/30/18 0906 08/02/18 0914   07/30/18 0015  ceFAZolin (ANCEF) IVPB 2g/100 mL premix  Status:  Discontinued     2 g 200 mL/hr over 30 Minutes Intravenous Every 8 hours 07/30/18 0013  07/30/18 0906   07/29/18 2345  cefTRIAXone (ROCEPHIN) 2 g in sodium chloride 0.9 % 100 mL IVPB     2 g 200 mL/hr over 30 Minutes Intravenous  Once 07/29/18 2336 07/30/18 0152   07/29/18 2345  cefTRIAXone (ROCEPHIN) 1 g in sodium chloride 0.9 % 100 mL IVPB  Status:  Discontinued     1 g 200 mL/hr over 30 Minutes Intravenous Every 24 hours 07/29/18 2336 07/29/18 2338      Best Practice/Protocols:  VTE Prophylaxis: Mechanical Continous Sedation  Consults: Treatment Team:  [facility], [place], MD Haddix, Gillie MannersKevin P, MD Sheral ApleyMurphy, Timothy D, MD Crista ElliotBell, Eugene D III, MD Reva BoresPratt, Tanya S, MD    Studies:    Events:  Subjective:    Overnight Issues:   Objective:  Vital signs for last 24 hours: Temp:  [95.4 F (35.2 C)-101.1 F (38.4 C)] 99.5 F (37.5 C) (12/16 0900) Pulse Rate:  [78-126] 83 (12/16 0900) Resp:  [7-28] 16 (12/16 0900) BP: (90-195)/(47-119) 94/47 (12/16 0900) SpO2:  [80 %-100 %] 100 % (12/16 0900) FiO2 (%):  [40 %-100 %] 40 % (12/16 0724) Weight:  [72.6 kg-81.2 kg] 81.2 kg (12/16 0000)  Hemodynamic parameters for last 24 hours:    Intake/Output from previous day: 12/15 0701 - 12/16 0700 In: 8527.6 [I.V.:3991.8; Blood:1475; IV Piggyback:1230.8] Out: 173 [Urine:173]  Intake/Output this shift: Total I/O In: 492.6 [I.V.:392.6; IV Piggyback:100] Out: -   Vent settings for last 24 hours: Vent Mode: PRVC FiO2 (%):  [40 %-100 %] 40 % Set Rate:  [  18 bmp] 18 bmp Vt Set:  [410 mL-420 mL] 410 mL PEEP:  [5 cmH20] 5 cmH20 Plateau Pressure:  [16 cmH20-22 cmH20] 17 cmH20  Physical Exam:  General: on vent Neuro: PERL, sedated HEENT/Neck: ETT Resp: clear to auscultation bilaterally CVS: RRR GI: soft, mild dist, binder in palce Extremities: calves soft  Results for orders placed or performed during the hospital encounter of 07/29/18 (from the past 24 hour(s))  Type and screen Ordered by PROVIDER DEFAULT     Status: None (Preliminary result)   Collection  Time: 07/29/18  7:12 PM  Result Value Ref Range   ABO/RH(D) AB POS    Antibody Screen NEG    Sample Expiration      08/01/2018 Performed at [facility], [address] [place], [place] [address]    Unit Number [id number]    Blood Component Type RED CELLS,LR    Unit division 00    Status of Unit ISSUED,FINAL    Unit tag comment VERBAL ORDERS PER DR STEINL    Transfusion Status OK TO TRANSFUSE    Crossmatch Result COMPATIBLE    Unit Number [id number]    Blood Component Type RBC LR PHER2    Unit division 00    Status of Unit ISSUED,FINAL    Unit tag comment VERBAL ORDERS PER DR STEINL    Transfusion Status OK TO TRANSFUSE    Crossmatch Result COMPATIBLE    Unit Number [id number]    Blood Component Type RBC LR PHER2    Unit division 00    Status of Unit ISSUED,FINAL    Transfusion Status OK TO TRANSFUSE    Crossmatch Result COMPATIBLE    Unit tag comment VERBAL ORDERS PER DR STEINL    Unit Number [id number]    Blood Component Type RBC LR PHER1    Unit division 00    Status of Unit ISSUED,FINAL    Transfusion Status OK TO TRANSFUSE    Crossmatch Result COMPATIBLE    Unit tag comment VERBAL ORDERS PER DR STEINL    Unit Number [id number]    Blood Component Type RBC LR PHER1    Unit division 00    Status of Unit ISSUED,FINAL    Transfusion Status OK TO TRANSFUSE    Crossmatch Result COMPATIBLE    Unit tag comment VERBAL ORDERS PER DR STEINL    Unit Number [id number]    Blood Component Type RED CELLS,LR    Unit division 00    Status of Unit ISSUED,FINAL    Unit tag comment VERBAL ORDERS PER DR STEINL    Transfusion Status OK TO TRANSFUSE    Crossmatch Result COMPATIBLE    Unit Number [id number]    Blood Component Type RED CELLS,LR    Unit division 00    Status of Unit REL FROM [facility]    Unit tag comment VERBAL ORDERS PER DR STEINL    Transfusion Status OK TO TRANSFUSE    Crossmatch Result COMPATIBLE    Unit Number [id number]    Blood  Component Type RED CELLS,LR    Unit division 00    Status of Unit REL FROM [facility]    Unit tag comment VERBAL ORDERS PER DR STEINL    Transfusion Status OK TO TRANSFUSE    Crossmatch Result COMPATIBLE    Unit Number [id number]    Blood Component Type RED CELLS,LR    Unit division 00    Status of Unit REL FROM [facility]    Unit tag comment VERBAL ORDERS  PER DR STEINL    Transfusion Status OK TO TRANSFUSE    Crossmatch Result COMPATIBLE    Unit Number [id number]    Blood Component Type RED CELLS,LR    Unit division 00    Status of Unit REL FROM [facility]    Unit tag comment VERBAL ORDERS PER DR STEINL    Transfusion Status OK TO TRANSFUSE    Crossmatch Result COMPATIBLE    Unit Number [id number]    Blood Component Type RED CELLS,LR    Unit division 00    Status of Unit ALLOCATED    Transfusion Status OK TO TRANSFUSE    Crossmatch Result Compatible    Unit Number [id number]    Blood Component Type RED CELLS,LR    Unit division 00    Status of Unit ALLOCATED    Transfusion Status OK TO TRANSFUSE    Crossmatch Result Compatible   Prepare fresh frozen plasma     Status: None   Collection Time: 07/29/18  7:12 PM  Result Value Ref Range   Unit Number [id number]    Blood Component Type LIQ PLASMA    Unit division 00    Status of Unit ISSUED,FINAL    Unit tag comment EMERGENCY RELEASE    Transfusion Status OK TO TRANSFUSE    Unit Number [id number]    Blood Component Type LIQ PLASMA    Unit division 00    Status of Unit ISSUED,FINAL    Unit tag comment EMERGENCY RELEASE    Transfusion Status OK TO TRANSFUSE    Unit Number [id number]    Blood Component Type LIQ PLASMA    Unit division 00    Status of Unit ISSUED,FINAL    Unit tag comment VERBAL ORDERS PER DR STEINL    Transfusion Status OK TO TRANSFUSE    Unit Number [id number]    Blood Component Type LIQ PLASMA    Unit division 00    Status of Unit ISSUED,FINAL    Unit tag comment VERBAL ORDERS PER DR  STEINL    Transfusion Status OK TO TRANSFUSE    Unit Number [id number]    Blood Component Type THAWED PLASMA    Unit division 00    Status of Unit ISSUED,FINAL    Unit tag comment VERBAL ORDERS PER DR STEINL    Transfusion Status      OK TO TRANSFUSE Performed at [facility], [address], [place], [place] [address]    Unit Number [id number]    Blood Component Type THAWED PLASMA    Unit division 00    Status of Unit ISSUED,FINAL    Unit tag comment VERBAL ORDERS PER DR STEINL    Transfusion Status OK TO TRANSFUSE    Unit Number [id number]    Blood Component Type THAWED PLASMA    Unit division 00    Status of Unit REL FROM [facility]    Unit tag comment VERBAL ORDERS PER DR STEINL    Transfusion Status OK TO TRANSFUSE    Unit Number [id number]    Blood Component Type THAWED PLASMA    Unit division 00    Status of Unit REL FROM [facility]    Unit tag comment VERBAL ORDERS PER DR STEINL    Transfusion Status OK TO TRANSFUSE    Unit Number [id number]    Blood Component Type THW PLS APHR    Unit division A0    Status of Unit REL FROM [facility]    Transfusion Status OK TO TRANSFUSE  Unit Number [id number]    Blood Component Type THW PLS APHR    Unit division 00    Status of Unit REL FROM [facility]    Transfusion Status OK TO TRANSFUSE    Unit Number [id number]    Blood Component Type THW PLS APHR    Unit division A0    Status of Unit REL FROM [facility]    Transfusion Status OK TO TRANSFUSE    Unit Number [id number]    Blood Component Type THW PLS APHR    Unit division A0    Status of Unit REL FROM [facility]    Transfusion Status OK TO TRANSFUSE    Unit Number [id number]    Blood Component Type THW PLS APHR    Unit division A0    Status of Unit REL FROM [facility]    Transfusion Status OK TO TRANSFUSE    Unit Number [id number]    Blood Component Type THW PLS APHR    Unit division B0    Status of Unit REL FROM [facility]    Transfusion Status OK TO TRANSFUSE     Unit Number [id number]    Blood Component Type THW PLS APHR    Unit division B0    Status of Unit REL FROM [facility]    Transfusion Status OK TO TRANSFUSE    Unit Number [id number]    Blood Component Type THW PLS APHR    Unit division B0    Status of Unit REL FROM [facility]    Transfusion Status OK TO TRANSFUSE   CDS serology     Status: None   Collection Time: 07/29/18  7:29 PM  Result Value Ref Range   CDS serology specimen      SPECIMEN WILL BE HELD FOR 14 DAYS IF TESTING IS REQUIRED  Comprehensive metabolic panel     Status: Abnormal   Collection Time: 07/29/18  7:29 PM  Result Value Ref Range   Sodium 139 135 - 145 mmol/L   Potassium 3.0 (L) 3.5 - 5.1 mmol/L   Chloride 106 98 - 111 mmol/L   CO2 18 (L) 22 - 32 mmol/L   Glucose, Bld 121 (H) 70 - 99 mg/dL   BUN 9 6 - 20 mg/dL   Creatinine, Ser 0.16 0.44 - 1.00 mg/dL   Calcium 8.1 (L) 8.9 - 10.3 mg/dL   Total Protein 6.6 6.5 - 8.1 g/dL   Albumin 3.6 3.5 - 5.0 g/dL   AST 85 (H) 15 - 41 U/L   ALT 41 0 - 44 U/L   Alkaline Phosphatase 62 38 - 126 U/L   Total Bilirubin 0.4 0.3 - 1.2 mg/dL   GFR calc non Af Amer >60 >60 mL/min   GFR calc Af Amer >60 >60 mL/min   Anion gap 15 5 - 15  CBC     Status: Abnormal   Collection Time: 07/29/18  7:29 PM  Result Value Ref Range   WBC 19.5 (H) 4.0 - 10.5 K/uL   RBC 3.81 (L) 3.87 - 5.11 MIL/uL   Hemoglobin 11.9 (L) 12.0 - 15.0 g/dL   HCT 01.0 93.2 - 35.5 %   MCV 98.7 80.0 - 100.0 fL   MCH 31.2 26.0 - 34.0 pg   MCHC 31.6 30.0 - 36.0 g/dL   RDW 73.2 20.2 - 54.2 %   Platelets 340 150 - 400 K/uL   nRBC 0.0 0.0 - 0.2 %  Ethanol     Status: Abnormal   Collection Time: 07/29/18  7:29 PM  Result Value Ref Range   Alcohol, Ethyl (B) 107 (H) <10 mg/dL  Protime-INR     Status: None   Collection Time: 07/29/18  7:29 PM  Result Value Ref Range   Prothrombin Time 13.9 11.4 - 15.2 seconds   INR 1.08   ABO/Rh     Status: None (Preliminary result)   Collection Time: 07/29/18  7:37 PM  Result  Value Ref Range   ABO/RH(D)      AB POS Performed at [facility], [address] [place], [place] [address]   I-Stat Chem 8, ED     Status: Abnormal   Collection Time: 07/29/18  7:39 PM  Result Value Ref Range   Sodium 140 135 - 145 mmol/L   Potassium 3.0 (L) 3.5 - 5.1 mmol/L   Chloride 103 98 - 111 mmol/L   BUN 9 6 - 20 mg/dL   Creatinine, Ser 6.04 (H) 0.44 - 1.00 mg/dL   Glucose, Bld 540 (H) 70 - 99 mg/dL   Calcium, Ion 9.81 (L) 1.15 - 1.40 mmol/L   TCO2 23 22 - 32 mmol/L   Hemoglobin 12.2 12.0 - 15.0 g/dL   HCT 19.1 47.8 - 29.5 %  I-Stat CG4 Lactic Acid, ED     Status: Abnormal   Collection Time: 07/29/18  7:39 PM  Result Value Ref Range   Lactic Acid, Venous 3.61 (HH) 0.5 - 1.9 mmol/L  Prepare platelet pheresis     Status: None   Collection Time: 07/29/18  8:22 PM  Result Value Ref Range   Unit Number [id number]    Blood Component Type PLTPHER LR2    Unit division 00    Status of Unit OUTDATED/DESTROYED    Transfusion Status      OK TO TRANSFUSE Performed at [facility], [address] [place], [place] [address]    Unit Number [id number]    Blood Component Type PLTP LR1 PAS    Unit division 00    Status of Unit REL FROM [facility]    Transfusion Status OK TO TRANSFUSE   Prepare cryoprecipitate     Status: None (Preliminary result)   Collection Time: 07/29/18  9:19 PM  Result Value Ref Range   Unit Number [id number]    Blood Component Type CRYPOOL THAW    Unit division 00    Status of Unit EXPIRED/DESTROYED    Transfusion Status      OK TO TRANSFUSE Performed at [facility], [address] [place], [place] [address]   I-Stat arterial blood gas, ED     Status: Abnormal   Collection Time: 07/29/18  9:31 PM  Result Value Ref Range   pH, Arterial 7.309 (L) 7.350 - 7.450   pCO2 arterial 46.6 32.0 - 48.0 mmHg   pO2, Arterial 333.0 (H) 83.0 - 108.0 mmHg   Bicarbonate 23.9 20.0 - 28.0 mmol/L   TCO2 25 22 - 32 mmol/L   O2 Saturation 100.0  %   Acid-base deficit 3.0 (H) 0.0 - 2.0 mmol/L   Patient temperature 95.4 F    Collection site RADIAL, ALLEN'S TEST ACCEPTABLE    Drawn by RT    Sample type ARTERIAL   Initiate MTP (Blood Bank Notification)     Status: None   Collection Time: 07/29/18  9:38 PM  Result Value Ref Range   Initiate Massive Transfusion Protocol      MTP ACTIVATED VERBAL ORDER CONFIRMED JENNIFER @ 2138 FOR DR. Denton Lank  Triglycerides  Status: None   Collection Time: 07/29/18  9:45 PM  Result Value Ref Range   Triglycerides 146 <150 mg/dL  Lactic acid, plasma     Status: Abnormal   Collection Time: 07/30/18  1:18 AM  Result Value Ref Range   Lactic Acid, Venous 3.0 (HH) 0.5 - 1.9 mmol/L  CBC     Status: Abnormal   Collection Time: 07/30/18  1:18 AM  Result Value Ref Range   WBC 6.4 4.0 - 10.5 K/uL   RBC 3.63 (L) 3.87 - 5.11 MIL/uL   Hemoglobin 11.1 (L) 12.0 - 15.0 g/dL   HCT 40.9 (L) 81.1 - 91.4 %   MCV 89.8 80.0 - 100.0 fL   MCH 30.6 26.0 - 34.0 pg   MCHC 34.0 30.0 - 36.0 g/dL   RDW 78.2 95.6 - 21.3 %   Platelets 106 (L) 150 - 400 K/uL   nRBC 0.0 0.0 - 0.2 %  Protime-INR     Status: None   Collection Time: 07/30/18  1:18 AM  Result Value Ref Range   Prothrombin Time 14.4 11.4 - 15.2 seconds   INR 1.13   Basic metabolic panel     Status: Abnormal   Collection Time: 07/30/18  1:18 AM  Result Value Ref Range   Sodium 143 135 - 145 mmol/L   Potassium 3.4 (L) 3.5 - 5.1 mmol/L   Chloride 106 98 - 111 mmol/L   CO2 21 (L) 22 - 32 mmol/L   Glucose, Bld 78 70 - 99 mg/dL   BUN 9 6 - 20 mg/dL   Creatinine, Ser 0.86 0.44 - 1.00 mg/dL   Calcium 7.2 (L) 8.9 - 10.3 mg/dL   GFR calc non Af Amer >60 >60 mL/min   GFR calc Af Amer >60 >60 mL/min   Anion gap 16 (H) 5 - 15    Assessment & Plan: Present on Admission: . Pelvic fracture (HCC) . Vaginal laceration, initial encounter . Subarachnoid hemorrhage following injury (HCC) . Subdural hematoma (HCC) . Midline shift of brain due to hematoma .  Aspiration pneumonia (HCC) . Facial fracture (HCC)    LOS: 1 day   Additional comments:I reviewed the patient's new clinical lab test results. and CXR [facility] TBI/SDH/SAH - F/U CT head later today per Dr. Jordan Likes Open open book pelvic FX - to OR today with Dr, Jena Gauss for ex fix Extraperitoneal bladder laceration - Per Dr. Alvester Morin. Repair planned. Continue Foley. Urine seems to be spontaneously draining out of vagina for the most part. CRT OK. Vaginal laceration - closed by Dr. Shawnie Pons Thrombocytopenia - consumptive, follow ABL anemia ID - Rocephin and Flagyl for open pelvic FX VTE - PAS for now FEN - albumin bolus, replete hypokalemia Dispo - ICU I spoke with her husband at eh bedside and discussed the plan of care. Critical Care Total Time*: 37 Minutes  Violeta Gelinas, MD, MPH, [facility] Trauma: [phone] General Surgery: [phone]  07/30/2018  *Care during the described time interval was provided by me. I have reviewed this patient's available data, including medical history, events of note, physical examination and test results as part of my evaluation.
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
## 2018-07-31 LAB — CBC
HCT: 25.5 % — ABNORMAL LOW (ref 36.0–46.0)
Hemoglobin: 8.3 g/dL — ABNORMAL LOW (ref 12.0–15.0)
MCH: 29.6 pg (ref 26.0–34.0)
MCHC: 32.5 g/dL (ref 30.0–36.0)
MCV: 91.1 fL (ref 80.0–100.0)
Platelets: 82 10*3/uL — ABNORMAL LOW (ref 150–400)
RBC: 2.8 MIL/uL — ABNORMAL LOW (ref 3.87–5.11)
RDW: 16 % — AB (ref 11.5–15.5)
WBC: 10.4 10*3/uL (ref 4.0–10.5)
nRBC: 0 % (ref 0.0–0.2)

## 2018-07-31 LAB — GLUCOSE, CAPILLARY
GLUCOSE-CAPILLARY: 100 mg/dL — AB (ref 70–99)
Glucose-Capillary: 102 mg/dL — ABNORMAL HIGH (ref 70–99)
Glucose-Capillary: 114 mg/dL — ABNORMAL HIGH (ref 70–99)
Glucose-Capillary: 115 mg/dL — ABNORMAL HIGH (ref 70–99)

## 2018-07-31 LAB — BASIC METABOLIC PANEL
Anion gap: 10 (ref 5–15)
BUN: 8 mg/dL (ref 6–20)
CO2: 19 mmol/L — ABNORMAL LOW (ref 22–32)
Calcium: 7 mg/dL — ABNORMAL LOW (ref 8.9–10.3)
Chloride: 112 mmol/L — ABNORMAL HIGH (ref 98–111)
Creatinine, Ser: 0.7 mg/dL (ref 0.44–1.00)
GFR calc Af Amer: >60 mL/min (ref >60)
GFR calc non Af Amer: >60 mL/min (ref >60)
Glucose, Bld: 109 mg/dL — ABNORMAL HIGH (ref 70–99)
Potassium: 3.9 mmol/L (ref 3.5–5.1)
Sodium: 141 mmol/L (ref 135–145)

## 2018-07-31 LAB — MAGNESIUM
Magnesium: 1.7 mg/dL (ref 1.7–2.4)
Magnesium: 2 mg/dL (ref 1.7–2.4)

## 2018-07-31 LAB — BLOOD PRODUCT ORDER (VERBAL) VERIFICATION

## 2018-07-31 LAB — PHOSPHORUS
Phosphorus: 2.7 mg/dL (ref 2.5–4.6)
Phosphorus: 1.5 mg/dL — ABNORMAL LOW (ref 2.5–4.6)

## 2018-07-31 MED ORDER — ADULT MULTIVITAMIN W/MINERALS CH
1.0000 | ORAL_TABLET | Freq: Every day | ORAL | Status: DC
  Administered 2018-07-31 – 2018-08-10 (×9): 1
  Filled 2018-07-31 (×9): qty 1

## 2018-07-31 MED ORDER — PIVOT 1.5 CAL PO LIQD
1000.0000 mL | ORAL | Status: DC
  Administered 2018-07-31: 1000 mL

## 2018-07-31 MED ORDER — PRO-STAT SUGAR FREE PO LIQD
60.0000 mL | Freq: Two times a day (BID) | ORAL | Status: DC
  Administered 2018-07-31 – 2018-08-03 (×5): 60 mL
  Filled 2018-07-31 (×5): qty 60

## 2018-07-31 MED ORDER — PIVOT 1.5 CAL PO LIQD
1000.0000 mL | ORAL | Status: DC
  Administered 2018-08-01 – 2018-08-02 (×2): 1000 mL

## 2018-07-31 MED ORDER — PRO-STAT SUGAR FREE PO LIQD
30.0000 mL | Freq: Two times a day (BID) | ORAL | Status: DC
  Administered 2018-07-31: 30 mL
  Filled 2018-07-31: qty 30

## 2018-07-31 MED ORDER — VITAL HIGH PROTEIN PO LIQD: 1000.0000 mL | ORAL | Status: DC

## 2018-07-31 NOTE — Progress Notes (Signed)
Patient ID: Olivia Melendez, female   DOB: 03/05/1981, 37 y.o.   MRN: [id number] Follow up - Trauma Critical Care  Patient Details:    Olivia Melendez is an 37 y.o. female.  Lines/tubes : Airway 7.5 mm (Active)  Secured at (cm) 23 cm 07/31/2018  7:32 AM  Measured From Lips 07/31/2018  7:32 AM  Secured Location Center 07/31/2018  7:32 AM  Secured By [employer] 07/31/2018  7:32 AM  Tube Holder Repositioned Yes 07/31/2018  7:32 AM  Cuff Pressure (cm H2O) 30 cm H2O 07/31/2018  7:32 AM  Site Condition Dry 07/31/2018  7:32 AM     Closed System Drain 1 Right;Anterior;Inferior Abdomen Bulb (JP) 19 Fr. (Active)  Site Description Unable to view 07/30/2018  8:00 PM  Dressing Status Clean;Dry;Intact 07/30/2018  8:00 PM  Drainage Appearance Bright red 07/30/2018  8:00 PM  Status To suction (Charged) 07/30/2018  8:00 PM  Output (mL) 50 mL 07/31/2018  5:00 AM     NG/OG Tube Orogastric 18 Fr. Center mouth Aucultation (Active)  Site Assessment Clean;Dry;Intact 07/30/2018  8:00 PM  Ongoing Placement Verification No change in cm markings or external length of tube from initial placement;No acute changes, not attributed to clinical condition;No change in respiratory status 07/30/2018  8:00 PM  Status Suction-low intermittent 07/30/2018  8:00 PM  Amount of suction 90 mmHg 07/30/2018  8:00 PM  Drainage Appearance Green 07/30/2018 12:15 AM     Urethral Catheter t. manny, Olivia Melendez Coude;Straight-tip 22 Fr. (Active)  Indication for Insertion or Continuance of Catheter Bladder outlet obstruction / other urologic reason 07/31/2018  7:20 AM  Site Assessment Clean;Intact 07/30/2018  8:00 PM  Catheter Maintenance Bag below level of bladder;Catheter secured 07/30/2018  8:00 PM  Collection Container Leg bag 07/30/2018  8:00 PM  Output (mL) 100 mL 07/31/2018  5:00 AM    Microbiology/Sepsis markers: No results found for this or any previous visit.  Anti-infectives:  Anti-infectives (From admission,  onward)   Start     Dose/Rate Route Frequency Ordered Stop   07/31/18 0100  cefTRIAXone (ROCEPHIN) 2 g in sodium chloride 0.9 % 100 mL IVPB     2 g 200 mL/hr over 30 Minutes Intravenous Every 24 hours 07/30/18 0906 08/03/18 0059   07/30/18 1458  vancomycin (VANCOCIN) powder  Status:  Discontinued       As needed 07/30/18 1458 07/30/18 1629   07/30/18 1457  tobramycin (NEBCIN) powder  Status:  Discontinued       As needed 07/30/18 1457 07/30/18 1629   07/30/18 1000  metroNIDAZOLE (FLAGYL) IVPB 500 mg     500 mg 100 mL/hr over 60 Minutes Intravenous Every 8 hours 07/30/18 0930 08/02/18 0959   07/30/18 0015  ceFAZolin (ANCEF) IVPB 2g/100 mL premix  Status:  Discontinued     2 g 200 mL/hr over 30 Minutes Intravenous Every 8 hours 07/30/18 0013 07/30/18 0906   07/29/18 2345  cefTRIAXone (ROCEPHIN) 2 g in sodium chloride 0.9 % 100 mL IVPB     2 g 200 mL/hr over 30 Minutes Intravenous  Once 07/29/18 2336 07/30/18 0152   07/29/18 2345  cefTRIAXone (ROCEPHIN) 1 g in sodium chloride 0.9 % 100 mL IVPB  Status:  Discontinued     1 g 200 mL/hr over 30 Minutes Intravenous Every 24 hours 07/29/18 2336 07/29/18 2338      Best Practice/Protocols:  VTE Prophylaxis: Mechanical Continous Sedation  Consults: Treatment Team:  [facility], Olivia Melendez, Olivia Melendez, Olivia Manners, Olivia Melendez Olivia Sicks, Olivia Melendez  Studies:    Events:  Subjective:    Overnight Issues:   Objective:  Vital signs for last 24 hours: Temp:  [97.9 F (36.6 C)-99.5 F (37.5 C)] 98.6 F (37 C) (12/17 0800) Pulse Rate:  [63-83] 71 (12/17 0732) Resp:  [16-18] 18 (12/17 0732) BP: (85-109)/(47-61) 109/60 (12/17 0732) SpO2:  [100 %] 100 % (12/17 0732) FiO2 (%):  [40 %] 40 % (12/17 0732)  Hemodynamic parameters for last 24 hours:    Intake/Output from previous day: 12/16 0701 - 12/17 0700 In: 5724.1 [I.V.:4974.4; IV Piggyback:749.7] Out: 2175 [Urine:1575; Drains:300; Blood:300]  Intake/Output this shift: No intake/output  data recorded.  Vent settings for last 24 hours: Vent Mode: PRVC FiO2 (%):  [40 %] 40 % Set Rate:  [18 bmp] 18 bmp Vt Set:  [410 mL] 410 mL PEEP:  [5 cmH20] 5 cmH20 Plateau Pressure:  [17 cmH20-20 cmH20] 19 cmH20  Physical Exam:  General: on vent Neuro: PERL, arouses and F/C HEENT/Neck: ETT Resp: clear to auscultation bilaterally CVS: RRR GI: soft, ex fix and urology dressing in place, NT Extremities: no edema, no erythema, pulses WNL  Results for orders placed or performed during the hospital encounter of 07/29/18 (from the past 24 hour(s))  Prepare RBC     Status: None   Collection Time: 07/30/18 10:05 AM  Result Value Ref Range   Order Confirmation      ORDER PROCESSED BY BLOOD BANK BB SAMPLE OR UNITS ALREADY AVAILABLE  BLOOD TRANSFUSION REPORT - SCANNED     Status: None   Collection Time: 07/30/18  3:53 PM   Narrative   Ordered by an unspecified provider.  CBC     Status: Abnormal   Collection Time: 07/31/18  4:36 AM  Result Value Ref Range   WBC 10.4 4.0 - 10.5 K/uL   RBC 2.80 (L) 3.87 - 5.11 MIL/uL   Hemoglobin 8.3 (L) 12.0 - 15.0 g/dL   HCT 16.1 (L) 09.6 - 04.5 %   MCV 91.1 80.0 - 100.0 fL   MCH 29.6 26.0 - 34.0 pg   MCHC 32.5 30.0 - 36.0 g/dL   RDW 40.9 (H) 81.1 - 91.4 %   Platelets 82 (L) 150 - 400 K/uL   nRBC 0.0 0.0 - 0.2 %  Basic metabolic panel     Status: Abnormal   Collection Time: 07/31/18  4:36 AM  Result Value Ref Range   Sodium 141 135 - 145 mmol/L   Potassium 3.9 3.5 - 5.1 mmol/L   Chloride 112 (H) 98 - 111 mmol/L   CO2 19 (L) 22 - 32 mmol/L   Glucose, Bld 109 (H) 70 - 99 mg/dL   BUN 8 6 - 20 mg/dL   Creatinine, Ser 7.82 0.44 - 1.00 mg/dL   Calcium 7.0 (L) 8.9 - 10.3 mg/dL   GFR calc non Af Amer >60 >60 mL/min   GFR calc Af Amer >60 >60 mL/min   Anion gap 10 5 - 15    Assessment & Plan: Present on Admission: . Pelvic fracture (HCC) . Vaginal laceration, initial encounter . Subarachnoid hemorrhage following injury (HCC) . Subdural  hematoma (HCC) . Midline shift of brain due to hematoma . Aspiration pneumonia (HCC) . Facial fracture (HCC)    LOS: 2 days   Additional comments:I reviewed the patient's new clinical lab test results. [facility] TBI/SDH/SAH - F/U CT head yesterday showed SAH and some L edema, F/C this AM, per Dr. Jordan Likes Open open book pelvic FX - S/P ex fix  12/16 by Dr. Jena GaussHaddix, back to OR 12/19 with Dr. Jena GaussHaddix for fixation Extraperitoneal bladder laceration - S/P repair by Dr. Berneice HeinrichManny 12/16 Acute hypoxic ventilator dependent respiratory failure - will start weaning, likely stay on vent until after surgery Thursday Vaginal laceration - closed by Dr. Shawnie PonsPratt Thrombocytopenia - consumptive, follow ABL anemia ID - Rocephin and Flagyl for open pelvic FX VTE - PAS for now, no Lovenox with PLTs<100k and TBI FEN - start TF, K better Dispo - ICU I spoke with her mother and husband Critical Care Total Time*: 1436 Minutes  Olivia GelinasBurke Acie Custis, Olivia Melendez, MPH, FACS Trauma: [phone] General Surgery: [phone]  07/31/2018  *Care during the described time interval was provided by me. I have reviewed this patient's available data, including medical history, events of note, physical examination and test results as part of my evaluation.

## 2018-07-31 NOTE — Progress Notes (Signed)
Patient awakens to voice.  She follows simple commands.  She appears aware.  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  Continue supportive efforts.  No new recommendations.

## 2018-07-31 NOTE — Anesthesia Postprocedure Evaluation (Signed)
Anesthesia Post Note  Patient: Olivia BaileyStephanie Gowell  Procedure(s) Performed: I&D and EXTERNAL FIXATION OF PELVIS (N/A ) OPEN CYSTOTOMY REPAIR (Bladder)     Anesthesia Post Evaluation  Last Vitals:  Vitals:   07/31/18 1300 07/31/18 1400  BP: (!) 100/59 (!) 95/54  Pulse: 71 73  Resp: 18 18  Temp:    SpO2: 100% 100%    Last Pain:  Vitals:   07/31/18 1200  TempSrc: Axillary  PainSc:                  [name]

## 2018-07-31 NOTE — Progress Notes (Signed)
RT NOTES: Patient placed on PSV for a SBT. Respiratory rate dropped to 7 even with coaching from RT. Placed back on PRVC at this time.

## 2018-07-31 NOTE — Progress Notes (Signed)
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 skeletal structures are unremarkable. IMPRESSION: No active disease. Electronically Signed   By: Chrisandra Netters.D.  On: 07/29/2018 19:50   Dg C-arm 1-60 Min  Result Date: 07/30/2018 CLINICAL DATA:  External fixation of pelvic fractures. EXAM: DG C-ARM 61-120 MIN; JUDET PELVIS - 3+ VIEW COMPARISON:  Pelvic radiographs and CT 07/29/2018 FLUOROSCOPY TIME:  C-arm fluoroscopic images were obtained intraoperatively and submitted for post operative interpretation. Please see the performing provider's procedural report for the fluoroscopy time utilized. FINDINGS: Thirteen intraoperative fluoroscopic images of the pelvis are provided. Bilateral inferior and left superior pubic rami fractures, a medial right acetabular fracture, and diastasis of the pubic symphysis and left SI joint are again seen. Subsequent images demonstrate placement of external fixator pins in both iliac wings. A surgical drain projects over the pelvis. IMPRESSION: Intraoperative images during external fixator placement for treatment of open book pelvic injury. Electronically Signed   By: Sebastian Ache M.D.   On: 07/30/2018 16:46   Dg C-arm 1-60 Min  Result Date: 07/30/2018 CLINICAL DATA:  External fixation of pelvic  fractures. EXAM: DG C-ARM 61-120 MIN; JUDET PELVIS - 3+ VIEW COMPARISON:  Pelvic radiographs and CT 07/29/2018 FLUOROSCOPY TIME:  C-arm fluoroscopic images were obtained intraoperatively and submitted for post operative interpretation. Please see the performing provider's procedural report for the fluoroscopy time utilized. FINDINGS: Thirteen intraoperative fluoroscopic images of the pelvis are provided. Bilateral inferior and left superior pubic rami fractures, a medial right acetabular fracture, and diastasis of the pubic symphysis and left SI joint are again seen. Subsequent images demonstrate placement of external fixator pins in both iliac wings. A surgical drain projects over the pelvis. IMPRESSION: Intraoperative images during external fixator placement for treatment of open book pelvic injury. Electronically Signed   By: Sebastian Ache M.D.   On: 07/30/2018 16:46    Anti-infectives: Anti-infectives (From admission, onward)   Start     Dose/Rate Route Frequency Ordered Stop   07/31/18 0100  cefTRIAXone (ROCEPHIN) 2 g in sodium chloride 0.9 % 100 mL IVPB     2 g 200 mL/hr over 30 Minutes Intravenous Every 24 hours 07/30/18 0906 08/03/18 0059   07/30/18 1458  vancomycin (VANCOCIN) powder  Status:  Discontinued       As needed 07/30/18 1458 07/30/18 1629   07/30/18 1457  tobramycin (NEBCIN) powder  Status:  Discontinued       As needed 07/30/18 1457 07/30/18 1629   07/30/18 1000  metroNIDAZOLE (FLAGYL) IVPB 500 mg     500 mg 100 mL/hr over 60 Minutes Intravenous Every 8 hours 07/30/18 0930 08/02/18 0959   07/30/18 0015  ceFAZolin (ANCEF) IVPB 2g/100 mL premix  Status:  Discontinued     2 g 200 mL/hr over 30 Minutes Intravenous Every 8 hours 07/30/18 0013 07/30/18 0906   07/29/18 2345  cefTRIAXone (ROCEPHIN) 2 g in sodium chloride 0.9 % 100 mL IVPB     2 g 200 mL/hr over 30 Minutes Intravenous  Once 07/29/18 2336 07/30/18 0152   07/29/18 2345  cefTRIAXone (ROCEPHIN) 1 g in sodium chloride 0.9  % 100 mL IVPB  Status:  Discontinued     1 g 200 mL/hr over 30 Minutes Intravenous Every 24 hours 07/29/18 2336 07/29/18 2338      Assessment/Plan:  1 - Large Extraperitoneal Bladder Rupture - doing well POD 1 from GU perspective. Keep JP and Foley. Will likely perform cystogram in 14+ days as long as JP output remains low.  Please call me directly with questions anytime.   [facility], Jakyiah Briones 07/31/2018
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
## 2018-07-31 NOTE — Plan of Care (Signed)
Pt is currently on tube feeds, once extubated will determine means of nutrition.  Will continue to monitor  Jaclyn ShaggySusie Lodie Waheed RN

## 2018-08-01 ENCOUNTER — Inpatient Hospital Stay (HOSPITAL_COMMUNITY): Payer: Medicaid Other

## 2018-08-01 LAB — BASIC METABOLIC PANEL
Anion gap: 7 (ref 5–15)
BUN: 9 mg/dL (ref 6–20)
CO2: 24 mmol/L (ref 22–32)
Calcium: 7.5 mg/dL — ABNORMAL LOW (ref 8.9–10.3)
Chloride: 110 mmol/L (ref 98–111)
Creatinine, Ser: 0.66 mg/dL (ref 0.44–1.00)
GFR calc Af Amer: >60 mL/min (ref >60)
GFR calc non Af Amer: >60 mL/min (ref >60)
GLUCOSE: 127 mg/dL — AB (ref 70–99)
Potassium: 3.2 mmol/L — ABNORMAL LOW (ref 3.5–5.1)
Sodium: 141 mmol/L (ref 135–145)

## 2018-08-01 LAB — CBC
HCT: 22.7 % — ABNORMAL LOW (ref 36.0–46.0)
Hemoglobin: 7.4 g/dL — ABNORMAL LOW (ref 12.0–15.0)
MCH: 30.2 pg (ref 26.0–34.0)
MCHC: 32.6 g/dL (ref 30.0–36.0)
MCV: 92.7 fL (ref 80.0–100.0)
Platelets: 87 10*3/uL — ABNORMAL LOW (ref 150–400)
RBC: 2.45 MIL/uL — ABNORMAL LOW (ref 3.87–5.11)
RDW: 15.9 % — ABNORMAL HIGH (ref 11.5–15.5)
WBC: 9.6 10*3/uL (ref 4.0–10.5)
nRBC: 0 % (ref 0.0–0.2)

## 2018-08-01 LAB — MAGNESIUM
Magnesium: 2.1 mg/dL (ref 1.7–2.4)
Magnesium: 2 mg/dL (ref 1.7–2.4)

## 2018-08-01 LAB — TRIGLYCERIDES: Triglycerides: 113 mg/dL (ref <150)

## 2018-08-01 LAB — GLUCOSE, CAPILLARY
Glucose-Capillary: 124 mg/dL — ABNORMAL HIGH (ref 70–99)
Glucose-Capillary: 111 mg/dL — ABNORMAL HIGH (ref 70–99)

## 2018-08-01 LAB — PREPARE RBC (CROSSMATCH)

## 2018-08-01 LAB — PHOSPHORUS
Phosphorus: 1.2 mg/dL — ABNORMAL LOW (ref 2.5–4.6)
Phosphorus: 3 mg/dL (ref 2.5–4.6)

## 2018-08-01 MED ORDER — POTASSIUM PHOSPHATES 15 MMOLE/5ML IV SOLN
30.0000 mmol | Freq: Once | INTRAVENOUS | Status: AC
  Administered 2018-08-01: 30 mmol via INTRAVENOUS
  Filled 2018-08-01: qty 10

## 2018-08-01 MED ORDER — SODIUM CHLORIDE 0.9% IV SOLUTION
Freq: Once | INTRAVENOUS | Status: AC
  Administered 2018-08-01: 10:00:00 via INTRAVENOUS

## 2018-08-01 MED ORDER — FUROSEMIDE 10 MG/ML IJ SOLN
20.0000 mg | Freq: Once | INTRAMUSCULAR | Status: AC
  Administered 2018-08-01: 20 mg via INTRAVENOUS
  Filled 2018-08-01: qty 2

## 2018-08-01 NOTE — Progress Notes (Signed)
No new issues or problems.  Patient will follow some simple commands.  Somewhat agitated on ventilator today.  Overall stable.  Continue observation and supportive care.  No new recommendations.

## 2018-08-01 NOTE — Plan of Care (Signed)
  Problem: Nutrition: Goal: Adequate nutrition will be maintained Outcome: Progressing   Problem: Clinical Measurements: Goal: Postoperative complications will be avoided or minimized Outcome: Progressing   Problem: Skin Integrity: Goal: Demonstration of wound healing without infection will improve Outcome: Progressing
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
## 2018-08-01 NOTE — Progress Notes (Signed)
Follow up - Trauma and Critical Care  Patient Details:    Olivia Melendez is an 37 y.o. female.  Lines/tubes : Airway 7.5 mm (Active)  Secured at (cm) 23 cm 08/01/2018  7:28 AM  Measured From Lips 08/01/2018  7:28 AM  Secured Location Left 08/01/2018  7:28 AM  Secured By [employer] 08/01/2018  7:28 AM  Tube Holder Repositioned Yes 08/01/2018  7:28 AM  Cuff Pressure (cm H2O) 30 cm H2O 08/01/2018  7:28 AM  Site Condition Dry 08/01/2018  7:28 AM     Closed System Drain 1 Right;Anterior;Inferior Abdomen Bulb (JP) 19 Fr. (Active)  Site Description Unable to view 08/01/2018  8:00 AM  Dressing Status Clean;Dry;Intact 08/01/2018  8:00 AM  Drainage Appearance Bright red 08/01/2018  8:00 AM  Status To suction (Charged) 08/01/2018  8:00 AM  Output (mL) 60 mL 08/01/2018  6:00 AM     NG/OG Tube Orogastric 18 Fr. Center mouth Aucultation (Active)  Site Assessment Clean;Dry;Intact 08/01/2018  8:00 AM  Ongoing Placement Verification No change in cm markings or external length of tube from initial placement;No acute changes, not attributed to clinical condition;No change in respiratory status;Xray 07/31/2018  8:00 PM  Status Suction-low intermittent 08/01/2018  8:00 AM  Amount of suction 90 mmHg 07/31/2018  8:00 PM  Drainage Appearance Green 07/31/2018  8:00 PM     Urethral Catheter t. manny, md Coude;Straight-tip 22 Fr. (Active)  Indication for Insertion or Continuance of Catheter Bladder outlet obstruction / other urologic reason 08/01/2018  8:00 AM  Site Assessment Clean;Intact 07/31/2018  8:00 PM  Catheter Maintenance Bag below level of bladder;Catheter secured;Drainage bag/tubing not touching floor;Insertion date on drainage bag;No dependent loops 08/01/2018  8:00 AM  Collection Container Leg bag 07/31/2018  8:00 PM  Securement Method Securing device (Describe) 07/31/2018  8:00 PM  Output (mL) 250 mL 08/01/2018  8:00 AM    Microbiology/Sepsis markers: No results found for this  or any previous visit.  Anti-infectives:  Anti-infectives (From admission, onward)   Start     Dose/Rate Route Frequency Ordered Stop   07/31/18 0100  cefTRIAXone (ROCEPHIN) 2 g in sodium chloride 0.9 % 100 mL IVPB     2 g 200 mL/hr over 30 Minutes Intravenous Every 24 hours 07/30/18 0906 08/03/18 0059   07/30/18 1458  vancomycin (VANCOCIN) powder  Status:  Discontinued       As needed 07/30/18 1458 07/30/18 1629   07/30/18 1457  tobramycin (NEBCIN) powder  Status:  Discontinued       As needed 07/30/18 1457 07/30/18 1629   07/30/18 1000  metroNIDAZOLE (FLAGYL) IVPB 500 mg     500 mg 100 mL/hr over 60 Minutes Intravenous Every 8 hours 07/30/18 0930 08/02/18 0959   07/30/18 0015  ceFAZolin (ANCEF) IVPB 2g/100 mL premix  Status:  Discontinued     2 g 200 mL/hr over 30 Minutes Intravenous Every 8 hours 07/30/18 0013 07/30/18 0906   07/29/18 2345  cefTRIAXone (ROCEPHIN) 2 g in sodium chloride 0.9 % 100 mL IVPB     2 g 200 mL/hr over 30 Minutes Intravenous  Once 07/29/18 2336 07/30/18 0152   07/29/18 2345  cefTRIAXone (ROCEPHIN) 1 g in sodium chloride 0.9 % 100 mL IVPB  Status:  Discontinued     1 g 200 mL/hr over 30 Minutes Intravenous Every 24 hours 07/29/18 2336 07/29/18 2338      Best Practice/Protocols:  VTE Prophylaxis: Mechanical GI Prophylaxis: Proton Pump Inhibitor Continous Sedation  Consults: Treatment Team:  [facility], [place], MD Haddix, Gillie MannersKevin P, MD Julio SicksPool, Henry, MD    Events:  Subjective:    Overnight Issues: Patient doing okay.  No acute distress.  Anxious.  Reaching for ETT.  Not weaning.  Objective:  Vital signs for last 24 hours: Temp:  [98.5 F (36.9 C)-99.7 F (37.6 C)] 99.7 F (37.6 C) (12/18 0800) Pulse Rate:  [68-89] 81 (12/18 0800) Resp:  [18-20] 18 (12/18 0800) BP: (94-115)/(48-78) 99/54 (12/18 0800) SpO2:  [100 %] 100 % (12/18 0800) FiO2 (%):  [40 %] 40 % (12/18 0728) Weight:  [81.1 kg] 81.1 kg (12/18 0500)  Hemodynamic parameters  for last 24 hours:    Intake/Output from previous day: 12/17 0701 - 12/18 0700 In: 3615.5 [I.V.:2635.6; NG/GT:579.8; IV Piggyback:400] Out: 1830 [Urine:1550; Drains:280]  Intake/Output this shift: Total I/O In: 393.5 [I.V.:212.5; NG/GT:90; IV Piggyback:91] Out: 250 [Urine:250]  Vent settings for last 24 hours: Vent Mode: PRVC FiO2 (%):  [40 %] 40 % Set Rate:  [18 bmp] 18 bmp Vt Set:  [410 mL] 410 mL PEEP:  [5 cmH20] 5 cmH20 Plateau Pressure:  [15 cmH20-17 cmH20] 16 cmH20  Physical Exam:  General: no respiratory distress and awakens without orientation. Neuro: nonfocal exam, confused, RASS 0 and RASS -1 HEENT/Neck: no JVD and ETT WNL  Resp: clear to auscultation bilaterally and CXR shows possible atelectasis on the righ near the fissure and possible effusion on the left CVS: regular rate and rhythm, S1, S2 normal, no murmur, click, rub or gallop GI: soft, not distended, good bowel sounds.  Tolerating tube feedings. Extremities: edema 1+ and external pelvic fixator in place.  Results for orders placed or performed during the hospital encounter of 07/29/18 (from the past 24 hour(s))  Provider-confirm verbal Blood Bank order - RBC, FFP, Type & Screen; 2 Units; Order taken: 07/29/2018; 7:11 PM; Level 1 Trauma, Emergency Release, STAT, MTP 2 units of O negative red cells and 2 units of A plasmas emergency released to the ER @ 191...     Status: None   Collection Time: 07/31/18 10:46 AM  Result Value Ref Range   Blood product order confirm MD AUTHORIZATION REQUESTED   Glucose, capillary     Status: Abnormal   Collection Time: 07/31/18 12:12 PM  Result Value Ref Range   Glucose-Capillary 102 (H) 70 - 99 mg/dL   Comment 1 Notify RN    Comment 2 Document in Chart   Glucose, capillary     Status: Abnormal   Collection Time: 07/31/18  3:21 PM  Result Value Ref Range   Glucose-Capillary 100 (H) 70 - 99 mg/dL   Comment 1 Notify RN    Comment 2 Document in Chart   Magnesium     Status:  None   Collection Time: 07/31/18  7:20 PM  Result Value Ref Range   Magnesium 2.0 1.7 - 2.4 mg/dL  Phosphorus     Status: Abnormal   Collection Time: 07/31/18  7:20 PM  Result Value Ref Range   Phosphorus 1.5 (L) 2.5 - 4.6 mg/dL  Glucose, capillary     Status: Abnormal   Collection Time: 07/31/18  7:40 PM  Result Value Ref Range   Glucose-Capillary 115 (H) 70 - 99 mg/dL  Glucose, capillary     Status: Abnormal   Collection Time: 07/31/18 11:36 PM  Result Value Ref Range   Glucose-Capillary 114 (H) 70 - 99 mg/dL  Glucose, capillary     Status: Abnormal   Collection Time: 08/01/18  3:11 AM  Result Value Ref Range   Glucose-Capillary 124 (H) 70 - 99 mg/dL  CBC     Status: Abnormal   Collection Time: 08/01/18  4:26 AM  Result Value Ref Range   WBC 9.6 4.0 - 10.5 K/uL   RBC 2.45 (L) 3.87 - 5.11 MIL/uL   Hemoglobin 7.4 (L) 12.0 - 15.0 g/dL   HCT 09.8 (L) 11.9 - 14.7 %   MCV 92.7 80.0 - 100.0 fL   MCH 30.2 26.0 - 34.0 pg   MCHC 32.6 30.0 - 36.0 g/dL   RDW 82.9 (H) 56.2 - 13.0 %   Platelets 87 (L) 150 - 400 K/uL   nRBC 0.0 0.0 - 0.2 %  Basic metabolic panel     Status: Abnormal   Collection Time: 08/01/18  4:26 AM  Result Value Ref Range   Sodium 141 135 - 145 mmol/L   Potassium 3.2 (L) 3.5 - 5.1 mmol/L   Chloride 110 98 - 111 mmol/L   CO2 24 22 - 32 mmol/L   Glucose, Bld 127 (H) 70 - 99 mg/dL   BUN 9 6 - 20 mg/dL   Creatinine, Ser 8.65 0.44 - 1.00 mg/dL   Calcium 7.5 (L) 8.9 - 10.3 mg/dL   GFR calc non Af Amer >60 >60 mL/min   GFR calc Af Amer >60 >60 mL/min   Anion gap 7 5 - 15  Magnesium     Status: None   Collection Time: 08/01/18  4:26 AM  Result Value Ref Range   Magnesium 2.1 1.7 - 2.4 mg/dL  Phosphorus     Status: Abnormal   Collection Time: 08/01/18  4:26 AM  Result Value Ref Range   Phosphorus 1.2 (L) 2.5 - 4.6 mg/dL  Prepare RBC     Status: None   Collection Time: 08/01/18  8:51 AM  Result Value Ref Range   Order Confirmation      ORDER PROCESSED BY BLOOD  BANK BLOOD ALREADY AVAILABLE Performed at [facility], [address], [place], [place] [address]      Assessment/Plan:   NEURO  Altered Mental Status:  agitation, pain and sedation   Plan: Will be more aggressive at weaning sedation after pelvic stabilization and no more surgery is necessary  PULM  Atelectasis/collapse (focal and right)   Plan: No plans for intervention.  Will exercise on the ventilator, butnot plan on extubation at this time.  CARDIO  No specific issues   Plan: CPM  RENAL  Urine output and renal function are good.  No evidence of leakage of urine into the perivesicular drain or through her vigina.   Plan: CPM.    GI  No specific issues.   Plan: CPM with tube feedings.   ID  No known infectious source   Plan: CPM  HEME  Anemia acute blood loss anemia)   Plan: Getting two units of PRBCs today for hemoglobin of 7.4.  ENDO No specific issues   Plan: CPM  Global Issues  Plan is to go back for surgery tomorrow and hopefully internally stabilize her pelvis.  Of course this will be dependent on the status of the previously injured and repaired bladder.Wean towards extubation after surgery tomorrow.      LOS: 3 days   Additional comments:I reviewed the patient's new clinical lab test results. cbc/bmet, I reviewed the patients new imaging test results. cxr and I have discussed and reviewed with family members patient's Mother  Critical Care Total Time*: 30 Minutes  Fayrene Fearing  Jodeci Rini 08/01/2018  *Care during the described time interval was provided by me and/or other providers on the critical care team.  I have reviewed this patient's available data, including medical history, events of note, physical examination and test results as part of my evaluation.

## 2018-08-02 ENCOUNTER — Encounter (HOSPITAL_COMMUNITY): Admission: EM | Disposition: A | Discharge status: Home / Self Care | Payer: Self-pay | Source: Home / Self Care

## 2018-08-02 ENCOUNTER — Inpatient Hospital Stay (HOSPITAL_COMMUNITY): Payer: Medicaid Other

## 2018-08-02 ENCOUNTER — Encounter (HOSPITAL_COMMUNITY): Payer: Self-pay | Admitting: Certified Registered Nurse Anesthetist

## 2018-08-02 ENCOUNTER — Inpatient Hospital Stay (HOSPITAL_COMMUNITY): Payer: Medicaid Other | Admitting: Certified Registered Nurse Anesthetist

## 2018-08-02 HISTORY — PX: ORIF PELVIC FRACTURE: SHX2128

## 2018-08-02 HISTORY — PX: EXTERNAL FIXATION REMOVAL: SHX5040

## 2018-08-02 HISTORY — PX: SACRO-ILIAC PINNING: SHX5050

## 2018-08-02 LAB — TYPE AND SCREEN
ABO/RH(D): AB POS
ABO/RH(D): AB POS
Antibody Screen: NEGATIVE
Antibody Screen: NEGATIVE
UNIT DIVISION: 0
Unit division: 0
Unit division: 0
Unit division: 0
Unit division: 0
Unit division: 0
Unit division: 0
Unit division: 0
Unit division: 0
Unit division: 0
Unit division: 0
Unit division: 0

## 2018-08-02 LAB — BPAM RBC
BLOOD PRODUCT EXPIRATION DATE: 202001022359
BLOOD PRODUCT EXPIRATION DATE: 202001022359
Blood Product Expiration Date: 201912192359
Blood Product Expiration Date: 201912192359
Blood Product Expiration Date: 201912272359
Blood Product Expiration Date: 201912292359
Blood Product Expiration Date: 201912292359
Blood Product Expiration Date: 202001022359
Blood Product Expiration Date: 202001022359
Blood Product Expiration Date: 202001022359
Blood Product Expiration Date: 202001022359
Blood Product Expiration Date: 202001052359
ISSUE DATE / TIME: 201912151915
ISSUE DATE / TIME: 201912151915
ISSUE DATE / TIME: 201912151951
ISSUE DATE / TIME: 201912151951
ISSUE DATE / TIME: 201912152000
ISSUE DATE / TIME: 201912152000
ISSUE DATE / TIME: 201912161102
ISSUE DATE / TIME: 201912170834
ISSUE DATE / TIME: 201912170834
ISSUE DATE / TIME: 201912171025
ISSUE DATE / TIME: 201912181259
ISSUE DATE / TIME: 201912181021
Unit Type and Rh: 600
Unit Type and Rh: 600
Unit Type and Rh: 6200
Unit Type and Rh: 6200
Unit Type and Rh: 6200
Unit Type and Rh: 6200
Unit Type and Rh: 6200
Unit Type and Rh: 6200
Unit Type and Rh: 9500
Unit Type and Rh: 9500
Unit Type and Rh: 9500
Unit Type and Rh: 9500

## 2018-08-02 LAB — CBC
HCT: 29.8 % — ABNORMAL LOW (ref 36.0–46.0)
Hemoglobin: 9.9 g/dL — ABNORMAL LOW (ref 12.0–15.0)
MCH: 30.5 pg (ref 26.0–34.0)
MCHC: 33.2 g/dL (ref 30.0–36.0)
MCV: 91.7 fL (ref 80.0–100.0)
Platelets: 107 10*3/uL — ABNORMAL LOW (ref 150–400)
RBC: 3.25 MIL/uL — ABNORMAL LOW (ref 3.87–5.11)
RDW: 15.2 % (ref 11.5–15.5)
WBC: 9.7 10*3/uL (ref 4.0–10.5)
nRBC: 0 % (ref 0.0–0.2)

## 2018-08-02 LAB — GLUCOSE, CAPILLARY
GLUCOSE-CAPILLARY: 127 mg/dL — AB (ref 70–99)
Glucose-Capillary: 117 mg/dL — ABNORMAL HIGH (ref 70–99)
Glucose-Capillary: 97 mg/dL (ref 70–99)
Glucose-Capillary: 115 mg/dL — ABNORMAL HIGH (ref 70–99)
Glucose-Capillary: 110 mg/dL — ABNORMAL HIGH (ref 70–99)
Glucose-Capillary: 107 mg/dL — ABNORMAL HIGH (ref 70–99)
Glucose-Capillary: 78 mg/dL (ref 70–99)

## 2018-08-02 LAB — BASIC METABOLIC PANEL
Anion gap: 6 (ref 5–15)
BUN: 11 mg/dL (ref 6–20)
CHLORIDE: 108 mmol/L (ref 98–111)
CO2: 26 mmol/L (ref 22–32)
Calcium: 7.6 mg/dL — ABNORMAL LOW (ref 8.9–10.3)
Creatinine, Ser: 0.52 mg/dL (ref 0.44–1.00)
GFR calc Af Amer: >60 mL/min (ref >60)
GFR calc non Af Amer: >60 mL/min (ref >60)
Glucose, Bld: 115 mg/dL — ABNORMAL HIGH (ref 70–99)
Potassium: 3.3 mmol/L — ABNORMAL LOW (ref 3.5–5.1)
Sodium: 140 mmol/L (ref 135–145)

## 2018-08-02 LAB — PROTIME-INR
INR: 1.11
Prothrombin Time: 14.2 seconds (ref 11.4–15.2)

## 2018-08-02 LAB — APTT: APTT: 32 s (ref 24–36)

## 2018-08-02 SURGERY — REMOVAL, EXTERNAL FIXATION DEVICE, PELVIS
Anesthesia: General

## 2018-08-02 MED ORDER — VANCOMYCIN HCL 1000 MG IV SOLR
INTRAVENOUS | Status: DC | PRN
  Administered 2018-08-02: 1000 mg

## 2018-08-02 MED ORDER — ROCURONIUM BROMIDE 10 MG/ML (PF) SYRINGE
PREFILLED_SYRINGE | INTRAVENOUS | Status: DC | PRN
  Administered 2018-08-02: 50 mg via INTRAVENOUS
  Administered 2018-08-02: 100 mg via INTRAVENOUS
  Administered 2018-08-02: 50 mg via INTRAVENOUS

## 2018-08-02 MED ORDER — EPHEDRINE 5 MG/ML INJ
INTRAVENOUS | Status: AC
  Filled 2018-08-02: qty 10

## 2018-08-02 MED ORDER — SODIUM CHLORIDE 0.9 % IV SOLN
2.0000 g | INTRAVENOUS | Status: AC
  Administered 2018-08-02 – 2018-08-04 (×3): 2 g via INTRAVENOUS
  Filled 2018-08-02 (×3): qty 20

## 2018-08-02 MED ORDER — 0.9 % SODIUM CHLORIDE (POUR BTL) OPTIME
TOPICAL | Status: DC | PRN
  Administered 2018-08-02: 1000 mL

## 2018-08-02 MED ORDER — TOBRAMYCIN SULFATE 1.2 G IJ SOLR
INTRAMUSCULAR | Status: DC | PRN
  Administered 2018-08-02: 1.2 g

## 2018-08-02 MED ORDER — FENTANYL CITRATE (PF) 250 MCG/5ML IJ SOLN
INTRAMUSCULAR | Status: AC
  Filled 2018-08-02: qty 5

## 2018-08-02 MED ORDER — PROPOFOL 1000 MG/100ML IV EMUL
INTRAVENOUS | Status: AC
  Filled 2018-08-02: qty 100

## 2018-08-02 MED ORDER — ROCURONIUM BROMIDE 50 MG/5ML IV SOSY
PREFILLED_SYRINGE | INTRAVENOUS | Status: AC
  Filled 2018-08-02: qty 20

## 2018-08-02 MED ORDER — CEFAZOLIN SODIUM 1 G IJ SOLR
INTRAMUSCULAR | Status: AC
  Filled 2018-08-02: qty 20

## 2018-08-02 MED ORDER — CEFAZOLIN SODIUM-DEXTROSE 2-3 GM-%(50ML) IV SOLR
INTRAVENOUS | Status: DC | PRN
  Administered 2018-08-02 (×2): 2 g via INTRAVENOUS

## 2018-08-02 MED ORDER — MIDAZOLAM HCL 2 MG/2ML IJ SOLN
INTRAMUSCULAR | Status: AC
  Filled 2018-08-02: qty 2

## 2018-08-02 MED ORDER — CEFAZOLIN SODIUM 1 G IJ SOLR
INTRAMUSCULAR | Status: AC
  Filled 2018-08-02: qty 30

## 2018-08-02 MED ORDER — ROCURONIUM BROMIDE 50 MG/5ML IV SOSY
PREFILLED_SYRINGE | INTRAVENOUS | Status: AC
  Filled 2018-08-02: qty 5

## 2018-08-02 MED ORDER — PROPOFOL 10 MG/ML IV BOLUS
INTRAVENOUS | Status: AC
  Filled 2018-08-02: qty 20

## 2018-08-02 MED ORDER — TOBRAMYCIN SULFATE 1.2 G IJ SOLR
INTRAMUSCULAR | Status: AC
  Filled 2018-08-02: qty 1.2

## 2018-08-02 MED ORDER — VANCOMYCIN HCL 1000 MG IV SOLR
INTRAVENOUS | Status: AC
  Filled 2018-08-02: qty 2000

## 2018-08-02 MED ORDER — PHENYLEPHRINE 40 MCG/ML (10ML) SYRINGE FOR IV PUSH (FOR BLOOD PRESSURE SUPPORT)
PREFILLED_SYRINGE | INTRAVENOUS | Status: AC
  Filled 2018-08-02: qty 10

## 2018-08-02 MED ORDER — PROPOFOL 10 MG/ML IV BOLUS
INTRAVENOUS | Status: DC | PRN
  Administered 2018-08-02: 50 mg via INTRAVENOUS

## 2018-08-02 MED ORDER — LACTATED RINGERS IV SOLN
INTRAVENOUS | Status: DC | PRN
  Administered 2018-08-02 (×2): via INTRAVENOUS

## 2018-08-02 MED ORDER — FENTANYL CITRATE (PF) 250 MCG/5ML IJ SOLN
INTRAMUSCULAR | Status: DC | PRN
  Administered 2018-08-02: 100 ug via INTRAVENOUS
  Administered 2018-08-02 (×4): 50 ug via INTRAVENOUS
  Administered 2018-08-02 (×2): 100 ug via INTRAVENOUS
  Administered 2018-08-02 (×2): 50 ug via INTRAVENOUS

## 2018-08-02 MED ORDER — MIDAZOLAM HCL 2 MG/2ML IJ SOLN
INTRAMUSCULAR | Status: DC | PRN
  Administered 2018-08-02: 2 mg via INTRAVENOUS

## 2018-08-02 MED ORDER — POTASSIUM CHLORIDE 10 MEQ/100ML IV SOLN
10.0000 meq | INTRAVENOUS | Status: AC
  Administered 2018-08-02 (×3): 10 meq via INTRAVENOUS
  Filled 2018-08-02 (×3): qty 100

## 2018-08-02 MED ORDER — SODIUM CHLORIDE 0.9 % IR SOLN
Status: DC | PRN
  Administered 2018-08-02 (×3): 3000 mL

## 2018-08-02 MED ORDER — LIDOCAINE 2% (20 MG/ML) 5 ML SYRINGE
INTRAMUSCULAR | Status: AC
  Filled 2018-08-02: qty 5

## 2018-08-02 MED ORDER — ONDANSETRON HCL 4 MG/2ML IJ SOLN
INTRAMUSCULAR | Status: AC
  Filled 2018-08-02: qty 2

## 2018-08-02 SURGICAL SUPPLY — 85 items
APPLIER CLIP 11 MED OPEN (CLIP) ×8
BIT DRILL 2.5X300 (BIT) IMPLANT
BIT DRILL CANN 4.5MM (BIT) IMPLANT
BLADE CLIPPER SURG (BLADE) IMPLANT
BRUSH SCRUB SURG 4.25 DISP (MISCELLANEOUS) ×8 IMPLANT
CHLORAPREP W/TINT 26ML (MISCELLANEOUS) ×8 IMPLANT
CLIP APPLIE 11 MED OPEN (CLIP) IMPLANT
COVER SURGICAL LIGHT HANDLE (MISCELLANEOUS) ×4 IMPLANT
COVER WAND RF STERILE (DRAPES) ×4 IMPLANT
DERMABOND ADVANCED (GAUZE/BANDAGES/DRESSINGS) ×2
DERMABOND ADVANCED .7 DNX12 (GAUZE/BANDAGES/DRESSINGS) ×4 IMPLANT
DRAIN CHANNEL 10F 3/8 F FF (DRAIN) ×2 IMPLANT
DRAIN CHANNEL 15F RND FF W/TCR (WOUND CARE) IMPLANT
DRAPE C-ARM 42X72 X-RAY (DRAPES) ×6 IMPLANT
DRAPE C-ARMOR (DRAPES) ×4 IMPLANT
DRAPE INCISE IOBAN 66X45 STRL (DRAPES) ×8 IMPLANT
DRAPE LAPAROTOMY TRNSV 102X78 (DRAPE) ×4 IMPLANT
DRAPE PROXIMA HALF (DRAPES) ×8 IMPLANT
DRAPE SURG 17X23 STRL (DRAPES) ×24 IMPLANT
DRAPE U-SHAPE 47X51 STRL (DRAPES) ×4 IMPLANT
DRAPE UNIVERSAL PACK (DRAPES) ×4 IMPLANT
DRILL BIT 2.5X300 (BIT) ×2
DRILL BIT CANN 4.5MM (BIT) ×4
DRSG MEPILEX BORDER 4X4 (GAUZE/BANDAGES/DRESSINGS) ×12 IMPLANT
DRSG MEPILEX BORDER 4X8 (GAUZE/BANDAGES/DRESSINGS) ×6 IMPLANT
ELECT REM PT RETURN 9FT ADLT (ELECTROSURGICAL) ×4
ELECTRODE REM PT RTRN 9FT ADLT (ELECTROSURGICAL) ×2 IMPLANT
EVACUATOR SILICONE 100CC (DRAIN) ×2 IMPLANT
GAUZE SPONGE 4X4 12PLY STRL (GAUZE/BANDAGES/DRESSINGS) ×2 IMPLANT
GLOVE BIO SURGEON STRL SZ 6.5 (GLOVE) ×10 IMPLANT
GLOVE BIO SURGEON STRL SZ7.5 (GLOVE) ×20 IMPLANT
GLOVE BIO SURGEONS STRL SZ 6.5 (GLOVE) ×4
GLOVE BIOGEL PI IND STRL 6.5 (GLOVE) ×2 IMPLANT
GLOVE BIOGEL PI IND STRL 7.5 (GLOVE) ×2 IMPLANT
GLOVE BIOGEL PI INDICATOR 6.5 (GLOVE) ×4
GLOVE BIOGEL PI INDICATOR 7.5 (GLOVE) ×4
GOWN STRL REUS W/ TWL LRG LVL3 (GOWN DISPOSABLE) ×4 IMPLANT
GOWN STRL REUS W/TWL LRG LVL3 (GOWN DISPOSABLE) ×6
GUIDEWIRE 2.0MM (WIRE) ×6 IMPLANT
GUIDEWIRE THREADED 2.8MM (WIRE) ×4 IMPLANT
HANDPIECE INTERPULSE COAX TIP (DISPOSABLE) ×2
KIT BASIN OR (CUSTOM PROCEDURE TRAY) ×4 IMPLANT
KIT TURNOVER KIT B (KITS) ×4 IMPLANT
MANIFOLD NEPTUNE II (INSTRUMENTS) ×4 IMPLANT
NS IRRIG 1000ML POUR BTL (IV SOLUTION) ×8 IMPLANT
PACK GENERAL/GYN (CUSTOM PROCEDURE TRAY) ×2 IMPLANT
PACK TOTAL JOINT (CUSTOM PROCEDURE TRAY) ×4 IMPLANT
PACK TOTAL KNEE CUSTOM (KITS) ×2 IMPLANT
PAD ARMBOARD 7.5X6 YLW CONV (MISCELLANEOUS) ×10 IMPLANT
PLATE BONE LOCK 65MM 5 HOLE (Plate) ×2 IMPLANT
PLATE PUBLIC SYMPHOSIS 3.5 (Plate) ×2 IMPLANT
SCREW CANN 6.5X150 FT (Screw) ×2 IMPLANT
SCREW CANN FT 7.3X80 (Screw) ×2 IMPLANT
SCREW CORTEX 3.5 12MM (Screw) ×2 IMPLANT
SCREW CORTEX 3.5 18MM (Screw) ×2 IMPLANT
SCREW CORTEX 3.5 20MM (Screw) ×4 IMPLANT
SCREW CORTEX 3.5 22MM (Screw) ×4 IMPLANT
SCREW CORTEX 3.5 36MM (Screw) ×4 IMPLANT
SCREW CORTEX 3.5X40MM (Screw) ×4 IMPLANT
SCREW LOCK CORT ST 3.5X12 (Screw) IMPLANT
SCREW LOCK CORT ST 3.5X18 (Screw) IMPLANT
SCREW LOCK CORT ST 3.5X20 (Screw) IMPLANT
SCREW LOCK CORT ST 3.5X22 (Screw) IMPLANT
SCREW LOCK CORT ST 3.5X36 (Screw) IMPLANT
SET HNDPC FAN SPRY TIP SCT (DISPOSABLE) IMPLANT
SPONGE LAP 18X18 X RAY DECT (DISPOSABLE) IMPLANT
STAPLER VISISTAT 35W (STAPLE) ×4 IMPLANT
SUCTION FRAZIER HANDLE 10FR (MISCELLANEOUS) ×2
SUCTION TUBE FRAZIER 10FR DISP (MISCELLANEOUS) ×2 IMPLANT
SUT ETHILON 3 0 PS 1 (SUTURE) ×6 IMPLANT
SUT MNCRL AB 3-0 PS2 18 (SUTURE) ×6 IMPLANT
SUT MON AB 2-0 CT1 36 (SUTURE) ×6 IMPLANT
SUT PDS AB 0 CT 36 (SUTURE) ×4 IMPLANT
SUT VIC AB 0 CT1 27 (SUTURE)
SUT VIC AB 0 CT1 27XBRD ANBCTR (SUTURE) ×4 IMPLANT
SUT VIC AB 1 CT1 18XCR BRD 8 (SUTURE) IMPLANT
SUT VIC AB 1 CT1 8-18 (SUTURE)
SUT VIC AB 2-0 CT1 27 (SUTURE) ×2
SUT VIC AB 2-0 CT1 TAPERPNT 27 (SUTURE) ×4 IMPLANT
SUT VIC AB 2-0 FS1 27 (SUTURE) ×2 IMPLANT
TOWEL OR 17X24 6PK STRL BLUE (TOWEL DISPOSABLE) ×6 IMPLANT
TOWEL OR 17X26 10 PK STRL BLUE (TOWEL DISPOSABLE) ×8 IMPLANT
UNDERPAD 30X30 (UNDERPADS AND DIAPERS) ×4 IMPLANT
WASHER FOR 5.0 SCREWS (Washer) ×6 IMPLANT
WATER STERILE IRR 1000ML POUR (IV SOLUTION) ×8 IMPLANT

## 2018-08-02 NOTE — Progress Notes (Signed)
Subjective/Chief Complaint:   1 - Large Extraperitoneal Bladder Rupture - left lateral posterior bladder rupture found at exploration / cystotomy repair [facility]. UO's patent and trigone / bladder neck UNinvolved. JP and 7F foley placed.  Today "Olivia Melendez" has undergone more definitive ortho repair of pelvic fracture. Still intubated. No high fevers. Foley output remians high relative to JP.   Objective: Vital signs in last 24 hours: Temp:  [98 F (36.7 C)-99.3 F (37.4 C)] 98 F (36.7 C) (12/19 1600) Pulse Rate:  [75-95] 95 (12/19 1600) Resp:  [18] 18 (12/19 1600) BP: (101-147)/(51-88) 147/88 (12/19 1600) SpO2:  [99 %-100 %] 100 % (12/19 1600) FiO2 (%):  [40 %] 40 % (12/19 0742) Weight:  [80.7 kg] 80.7 kg (12/19 0500) Last BM Date: (PTA)  Intake/Output from previous day: 12/18 0701 - 12/19 0700 In: 4362.6 [I.V.:2557.9; Blood:630; NG/GT:330; IV Piggyback:844.7] Out: 3500 [Urine:3065; Emesis/NG output:175; Drains:260] Intake/Output this shift: Total I/O In: 1563.4 [I.V.:1389.1; IV Piggyback:174.3] Out: 1110 [Urine:885; Drains:25; Blood:200]  General appearance: no distress Eyes: negative Throat: lips, mucosa, and tongue normal; teeth and gums normal and ETT in expected position with feeding tube in place too.  Neck: no adenopathy, no carotid bruit, no JVD, supple, symmetrical, trachea midline and thyroid not enlarged, symmetric, no tenderness/mass/nodules Back: symmetric, no curvature. ROM normal. No CVA tenderness. Resp: non-labored on vent, non-coarse Cardio: regular mild tachycardia by bedside monitor GI: soft, non-tender; bowel sounds normal; no masses,  no organomegaly Pelvic: minimal vaginal spotting, stable labial bruising. RLQ JP with non-foul serosanguinous fluid. Foley with clear yellow urine that is non-foul. Extremities: extremities normal, atraumatic, no cyanosis or edema Neurologic: Mental status: GCS 3T Incision/Wound: ,  dressing over pfannensteil incision.   Lab Results:  Recent Labs    08/01/18 0426 08/02/18 0424  WBC 9.6 9.7  HGB 7.4* 9.9*  HCT 22.7* 29.8*  PLT 87* 107*   BMET Recent Labs    08/01/18 0426 08/02/18 0424  NA 141 140  K 3.2* 3.3*  CL 110 108  CO2 24 26  GLUCOSE 127* 115*  BUN 9 11  CREATININE 0.66 0.52  CALCIUM 7.5* 7.6*   PT/INR Recent Labs    08/02/18 0424  LABPROT 14.2  INR 1.11   ABG No results for input(s): PHART, HCO3 in the last 72 hours.  Invalid input(s): PCO2, PO2  Studies/Results: Dg Pelvis Comp Min 3v  Result Date: 08/02/2018 CLINICAL DATA:  Pelvic fracture EXAM: DG C-ARM 61-120 MIN; JUDET PELVIS - 3+ VIEW COMPARISON:  07/30/2018, CT 07/29/2018 FINDINGS: Twenty-four low resolution intraoperative spot views of the pelvis. Total fluoroscopy time was 356.1 seconds. Initial images demonstrate external fixation device in place. Subsequent placement of individual fixating screws across the SI joints. Single long fixating screw across both SI joints. Plate and screw fixation across the pubic symphysis and superior pubic rami. Multiple pelvic fractures are noted. IMPRESSION: Intraoperative fluoroscopic assistance provided during surgical fixation of pelvic fractures. Electronically Signed   By: Jasmine PangKim  Fujinaga M.D.   On: 08/02/2018 15:24   Dg Chest Port 1 View  Result Date: 08/01/2018 CLINICAL DATA:  Hypoxia EXAM: PORTABLE CHEST 1 VIEW COMPARISON:  July 30, 2018 FINDINGS: Endotracheal tube tip is 4.4 cm above the carina. Nasogastric tube tip and side port are below the diaphragm. No pneumothorax. There is atelectatic change in the right upper lobe. There is no edema or consolidation. The heart size and pulmonary vascularity are normal. No adenopathy. No bone lesions evident. IMPRESSION: Tube  positions as described without pneumothorax. Right upper lobe atelectatic change. No frank edema or consolidation. Stable cardiac silhouette. Electronically Signed   By:  Bretta BangWilliam  Woodruff III M.D.   On: 08/01/2018 07:42   Dg C-arm 1-60 Min  Result Date: 08/02/2018 CLINICAL DATA:  Pelvic fracture EXAM: DG C-ARM 61-120 MIN; JUDET PELVIS - 3+ VIEW COMPARISON:  07/30/2018, CT 07/29/2018 FINDINGS: Twenty-four low resolution intraoperative spot views of the pelvis. Total fluoroscopy time was 356.1 seconds. Initial images demonstrate external fixation device in place. Subsequent placement of individual fixating screws across the SI joints. Single long fixating screw across both SI joints. Plate and screw fixation across the pubic symphysis and superior pubic rami. Multiple pelvic fractures are noted. IMPRESSION: Intraoperative fluoroscopic assistance provided during surgical fixation of pelvic fractures. Electronically Signed   By: Jasmine PangKim  Fujinaga M.D.   On: 08/02/2018 15:24    Anti-infectives: Anti-infectives (From admission, onward)   Start     Dose/Rate Route Frequency Ordered Stop   08/02/18 1419  tobramycin (NEBCIN) powder  Status:  Discontinued       As needed 08/02/18 1419 08/02/18 1511   08/02/18 1418  vancomycin (VANCOCIN) powder  Status:  Discontinued       As needed 08/02/18 1418 08/02/18 1511   07/31/18 0100  cefTRIAXone (ROCEPHIN) 2 g in sodium chloride 0.9 % 100 mL IVPB     2 g 200 mL/hr over 30 Minutes Intravenous Every 24 hours 07/30/18 0906 08/02/18 0200   07/30/18 1458  vancomycin (VANCOCIN) powder  Status:  Discontinued       As needed 07/30/18 1458 07/30/18 1629   07/30/18 1457  tobramycin (NEBCIN) powder  Status:  Discontinued       As needed 07/30/18 1457 07/30/18 1629   07/30/18 1000  metroNIDAZOLE (FLAGYL) IVPB 500 mg     500 mg 100 mL/hr over 60 Minutes Intravenous Every 8 hours 07/30/18 0930 08/02/18 0330   07/30/18 0015  ceFAZolin (ANCEF) IVPB 2g/100 mL premix  Status:  Discontinued     2 g 200 mL/hr over 30 Minutes Intravenous Every 8 hours 07/30/18 0013 07/30/18 0906   07/29/18 2345  cefTRIAXone (ROCEPHIN) 2 g in sodium chloride 0.9 %  100 mL IVPB     2 g 200 mL/hr over 30 Minutes Intravenous  Once 07/29/18 2336 07/30/18 0152   07/29/18 2345  cefTRIAXone (ROCEPHIN) 1 g in sodium chloride 0.9 % 100 mL IVPB  Status:  Discontinued     1 g 200 mL/hr over 30 Minutes Intravenous Every 24 hours 07/29/18 2336 07/29/18 2338      Assessment/Plan:  1 - Large Extraperitoneal Bladder Rupture - doing well POD 3 from GU perspective. Keep JP and Foley. Will likely perform cystogram in 14+ days as long as JP output remains low. WIll plan on JP removal prior (likely early next week) as long as output low.   Please call me directly with questions anytime.   Sebastian Acheheodore Sahaana Weitman 08/02/2018

## 2018-08-02 NOTE — Care Management Note (Addendum)
Case Management Note  Patient Details  Name: Olivia Melendez MRN: [id number] Date of Birth: 05-07-1981  Subjective/Objective:    [facility]SA[facility], open book pelvic fx, bladder lacertion, vaginal laceration, and VDRF.  PTA, pt independent, lives with roommate.  Has supportive mother and husband at bedside. (currently separated from spouse).              Action/Plan: Pt currently remains sedated and on ventilator.  To OR 12/19 for pelvis fixation.    Expected Discharge Date:                  Expected Discharge Plan:     In-House Referral:  Clinical Social Work  Discharge planning Services  CM Consult  Post Acute Care Choice:    Choice offered to:     DME Arranged:    DME Agency:     HH Arranged:    HH Agency:     Status of Service:  In process, will continue to follow  If discussed at Long Length of Stay Meetings, dates discussed:    Additional Comments:  Quintella BatonJulie W. Delila Kuklinski, RN, BSN  Trauma/Neuro ICU Case Manager [phone]
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
## 2018-08-02 NOTE — Anesthesia Preprocedure Evaluation (Signed)
Anesthesia Evaluation  Patient identified by MRN, date of birth, ID band Patient unresponsive    Reviewed: Allergy & Precautions, H&P , NPO status , Patient's Chart, lab work & pertinent test results  Airway Mallampati: Intubated       Dental no notable dental hx.    Pulmonary Current Smoker,    Pulmonary exam normal breath sounds clear to auscultation       Cardiovascular Exercise Tolerance: Good hypertension, Pt. on medications Normal cardiovascular exam Rhythm:regular Rate:Normal     Neuro/Psych CT head  IMPRESSION: 1. Small left greater than right cerebral subarachnoid hemorrhage. 2. 7 mm left sided subdural hematoma at the vertex. 3. 3 mm left-to-right midline shift.  No herniation. 4. Fractures of the bilateral nasal bones and left anterior maxillary sinus wall. 5. No acute cervical spine fracture.  negative neurological ROS  negative psych ROS   GI/Hepatic negative GI ROS, Neg liver ROS,   Endo/Other  negative endocrine ROS  Renal/GU negative Renal ROS  negative genitourinary   Musculoskeletal   Abdominal (+) + obese,   Peds  Hematology negative hematology ROS (+)   Anesthesia Other Findings   Reproductive/Obstetrics negative OB ROS                             Anesthesia Physical  Anesthesia Plan  ASA: III  Anesthesia Plan: General   Post-op Pain Management:    Induction: Inhalational  PONV Risk Score and Plan: 3 and Ondansetron and Treatment may vary due to age or medical condition  Airway Management Planned: Oral ETT  Additional Equipment:   Intra-op Plan:   Post-operative Plan: Post-operative intubation/ventilation  Informed Consent: I have reviewed the patients History and Physical, chart, labs and discussed the procedure including the risks, benefits and alternatives for the proposed anesthesia with the patient or authorized representative who has  indicated his/her understanding and acceptance.     Plan Discussed with: CRNA  Anesthesia Plan Comments: (  )        Anesthesia Quick Evaluation

## 2018-08-02 NOTE — Interval H&P Note (Signed)
History and Physical Interval Note:  08/02/2018 10:02 AM  Olivia BaileyStephanie Melendez  has presented today for surgery, with the diagnosis of PELVIC FRACTURE  The various methods of treatment have been discussed with the patient and family. After consideration of risks, benefits and other options for treatment, the patient has consented to  Procedure(s): REMOVAL EXTERNAL FIXATION PELVIS (Bilateral) OPEN REDUCTION INTERNAL FIXATION (ORIF) PELVIC FRACTURE (N/A) SACRO-ILIAC PINNING (Left) as a surgical intervention .  The patient's history has been reviewed, patient examined, no change in status, stable for surgery.  I have reviewed the patient's chart and labs.  Questions were answered to the patient's satisfaction.     Caryn BeeKevin P Haddix

## 2018-08-02 NOTE — Progress Notes (Signed)
Patient ID: Olivia BaileyStephanie Melendez, female   DOB: 12-03-1980, 37 y.o.   MRN: [id number] Follow up - Trauma Critical Care  Patient Details:    Olivia BaileyStephanie Melendez is an 37 y.o. female.  Lines/tubes : Airway 7.5 mm (Active)  Secured at (cm) 23 cm 08/02/2018  7:42 AM  Measured From Lips 08/02/2018  7:42 AM  Secured Location Right 08/02/2018  7:42 AM  Secured By [employer] Tube Holder 08/02/2018  7:42 AM  Tube Holder Repositioned Yes 08/02/2018  7:42 AM  Cuff Pressure (cm H2O) 24 cm H2O 08/01/2018  7:24 PM  Site Condition Dry 08/02/2018  7:42 AM     Closed System Drain 1 Right;Anterior;Inferior Abdomen Bulb (JP) 19 Fr. (Active)  Site Description Unable to view 08/01/2018  8:00 PM  Dressing Status Clean;Dry;Intact 08/01/2018  8:00 PM  Drainage Appearance Bright red 08/01/2018  8:00 PM  Status To suction (Charged) 08/01/2018  8:00 PM  Output (mL) 80 mL 08/02/2018  6:00 AM     NG/OG Tube Orogastric 18 Fr. Center mouth Aucultation (Active)  Site Assessment Clean;Dry;Intact 08/01/2018  8:00 PM  Ongoing Placement Verification No change in cm markings or external length of tube from initial placement;No acute changes, not attributed to clinical condition;No change in respiratory status;Xray 07/31/2018  8:00 PM  Status Suction-low intermittent 08/01/2018  8:00 PM  Amount of suction 90 mmHg 07/31/2018  8:00 PM  Drainage Appearance Green 07/31/2018  8:00 PM  Output (mL) 75 mL 08/01/2018 12:00 PM     Urethral Catheter t. manny, md Coude;Straight-tip 22 Fr. (Active)  Indication for Insertion or Continuance of Catheter Bladder outlet obstruction / other urologic reason 08/02/2018  7:53 AM  Site Assessment Clean;Intact 08/01/2018  8:00 PM  Catheter Maintenance Bag below level of bladder;Catheter secured;Drainage bag/tubing not touching floor;No dependent loops 08/02/2018  7:53 AM  Collection Container Leg bag 07/31/2018  8:00 PM  Securement Method Securing device (Describe) 07/31/2018  8:00 PM  Output (mL)  200 mL 08/02/2018  6:00 AM    Microbiology/Sepsis markers: No results found for this or any previous visit.  Anti-infectives:  Anti-infectives (From admission, onward)   Start     Dose/Rate Route Frequency Ordered Stop   07/31/18 0100  cefTRIAXone (ROCEPHIN) 2 g in sodium chloride 0.9 % 100 mL IVPB     2 g 200 mL/hr over 30 Minutes Intravenous Every 24 hours 07/30/18 0906 08/02/18 0200   07/30/18 1458  vancomycin (VANCOCIN) powder  Status:  Discontinued       As needed 07/30/18 1458 07/30/18 1629   07/30/18 1457  tobramycin (NEBCIN) powder  Status:  Discontinued       As needed 07/30/18 1457 07/30/18 1629   07/30/18 1000  metroNIDAZOLE (FLAGYL) IVPB 500 mg     500 mg 100 mL/hr over 60 Minutes Intravenous Every 8 hours 07/30/18 0930 08/02/18 0330   07/30/18 0015  ceFAZolin (ANCEF) IVPB 2g/100 mL premix  Status:  Discontinued     2 g 200 mL/hr over 30 Minutes Intravenous Every 8 hours 07/30/18 0013 07/30/18 0906   07/29/18 2345  cefTRIAXone (ROCEPHIN) 2 g in sodium chloride 0.9 % 100 mL IVPB     2 g 200 mL/hr over 30 Minutes Intravenous  Once 07/29/18 2336 07/30/18 0152   07/29/18 2345  cefTRIAXone (ROCEPHIN) 1 g in sodium chloride 0.9 % 100 mL IVPB  Status:  Discontinued     1 g 200 mL/hr over 30 Minutes Intravenous Every 24 hours 07/29/18 2336 07/29/18 2338  Best Practice/Protocols:  VTE Prophylaxis: Mechanical Continous Sedation  Consults: Treatment Team:  [facility], [place], MD Haddix, Gillie Manners, MD Julio Sicks, MD    Studies:    Events:  Subjective:    Overnight Issues:   Objective:  Vital signs for last 24 hours: Temp:  [97.6 F (36.4 C)-100 F (37.8 C)] 99 F (37.2 C) (12/19 0400) Pulse Rate:  [73-93] 82 (12/19 0700) Resp:  [16-21] 18 (12/19 0700) BP: (88-120)/(51-77) 103/64 (12/19 0741) SpO2:  [99 %-100 %] 100 % (12/19 0700) FiO2 (%):  [40 %] 40 % (12/19 0742) Weight:  [80.7 kg] 80.7 kg (12/19 0500)  Hemodynamic parameters for last 24  hours:    Intake/Output from previous day: 12/18 0701 - 12/19 0700 In: 4362.6 [I.V.:2557.9; Blood:630; NG/GT:330; IV Piggyback:844.7] Out: 3500 [Urine:3065; Emesis/NG output:175; Drains:260]  Intake/Output this shift: No intake/output data recorded.  Vent settings for last 24 hours: Vent Mode: PRVC FiO2 (%):  [40 %] 40 % Set Rate:  [18 bmp] 18 bmp Vt Set:  [410 mL] 410 mL PEEP:  [5 cmH20] 5 cmH20 Pressure Support:  [5 cmH20] 5 cmH20 Plateau Pressure:  [16 cmH20-17 cmH20] 16 cmH20  Physical Exam:  General: on vent Neuro: PERL, arouses and F/C HEENT/Neck: ETT Resp: coarse B CVS: RRR GI: soft, uro dressing, JP SS, ex fix Extremities: calves soft  Results for orders placed or performed during the hospital encounter of 07/29/18 (from the past 24 hour(s))  Prepare RBC     Status: None   Collection Time: 08/01/18  8:51 AM  Result Value Ref Range   Order Confirmation      ORDER PROCESSED BY BLOOD BANK BLOOD ALREADY AVAILABLE Performed at [facility], [address], [place], [place] [address]   Glucose, capillary     Status: Abnormal   Collection Time: 08/01/18 11:23 AM  Result Value Ref Range   Glucose-Capillary 111 (H) 70 - 99 mg/dL   Comment 1 Notify RN    Comment 2 Document in Chart   Glucose, capillary     Status: Abnormal   Collection Time: 08/01/18  3:20 PM  Result Value Ref Range   Glucose-Capillary 127 (H) 70 - 99 mg/dL   Comment 1 Notify RN    Comment 2 Repeat Test    Comment 3 Document in Chart   Type and screen [facility]     Status: None   Collection Time: 08/01/18  6:44 PM  Result Value Ref Range   ABO/RH(D) AB POS    Antibody Screen NEG    Sample Expiration      08/04/2018 Performed at [facility], [address], [place], [place] [address]   Triglycerides     Status: None   Collection Time: 08/01/18  7:00 PM  Result Value Ref Range   Triglycerides 113 <150 mg/dL  Magnesium     Status: None   Collection Time:  08/01/18  7:00 PM  Result Value Ref Range   Magnesium 2.0 1.7 - 2.4 mg/dL  Phosphorus     Status: None   Collection Time: 08/01/18  7:00 PM  Result Value Ref Range   Phosphorus 3.0 2.5 - 4.6 mg/dL  Glucose, capillary     Status: None   Collection Time: 08/01/18  7:44 PM  Result Value Ref Range   Glucose-Capillary 97 70 - 99 mg/dL  Glucose, capillary     Status: Abnormal   Collection Time: 08/01/18 11:11 PM  Result Value Ref Range  Glucose-Capillary 115 (H) 70 - 99 mg/dL  Glucose, capillary     Status: Abnormal   Collection Time: 08/02/18  3:39 AM  Result Value Ref Range   Glucose-Capillary 110 (H) 70 - 99 mg/dL  CBC     Status: Abnormal   Collection Time: 08/02/18  4:24 AM  Result Value Ref Range   WBC 9.7 4.0 - 10.5 K/uL   RBC 3.25 (L) 3.87 - 5.11 MIL/uL   Hemoglobin 9.9 (L) 12.0 - 15.0 g/dL   HCT 54.029.8 (L) 98.136.0 - 19.146.0 %   MCV 91.7 80.0 - 100.0 fL   MCH 30.5 26.0 - 34.0 pg   MCHC 33.2 30.0 - 36.0 g/dL   RDW 47.815.2 29.511.5 - 62.115.5 %   Platelets 107 (L) 150 - 400 K/uL   nRBC 0.0 0.0 - 0.2 %  Basic metabolic panel     Status: Abnormal   Collection Time: 08/02/18  4:24 AM  Result Value Ref Range   Sodium 140 135 - 145 mmol/L   Potassium 3.3 (L) 3.5 - 5.1 mmol/L   Chloride 108 98 - 111 mmol/L   CO2 26 22 - 32 mmol/L   Glucose, Bld 115 (H) 70 - 99 mg/dL   BUN 11 6 - 20 mg/dL   Creatinine, Ser 3.080.52 0.44 - 1.00 mg/dL   Calcium 7.6 (L) 8.9 - 10.3 mg/dL   GFR calc non Af Amer >60 >60 mL/min   GFR calc Af Amer >60 >60 mL/min   Anion gap 6 5 - 15  APTT     Status: None   Collection Time: 08/02/18  4:24 AM  Result Value Ref Range   aPTT 32 24 - 36 seconds  Protime-INR     Status: None   Collection Time: 08/02/18  4:24 AM  Result Value Ref Range   Prothrombin Time 14.2 11.4 - 15.2 seconds   INR 1.11   Glucose, capillary     Status: Abnormal   Collection Time: 08/02/18  7:37 AM  Result Value Ref Range   Glucose-Capillary 107 (H) 70 - 99 mg/dL   Comment 1 Notify RN    Comment 2  Document in Chart     Assessment & Plan: Present on Admission: . Pelvic fracture (HCC) . Vaginal laceration, initial encounter . Subarachnoid hemorrhage following injury (HCC) . Subdural hematoma (HCC) . Midline shift of brain due to hematoma . Aspiration pneumonia (HCC) . Facial fracture (HCC)    LOS: 4 days   Additional comments:I reviewed the patient's new clinical lab test results. Marland Kitchen. [facility] - F/C this AM, per Dr. Jordan LikesPool Open open book pelvic FX - S/P ex fix 12/16 by Dr. Jena GaussHaddix, back to OR 12/19 with Dr. Jena GaussHaddix for fixation Extraperitoneal bladder laceration - S/P repair by Dr. Berneice HeinrichManny 12/16 Acute hypoxic ventilator dependent respiratory failure - hope to wean tomorrow Vaginal laceration - closed by Dr. Shawnie PonsPratt Thrombocytopenia - consumptive, now 107 ABL anemia ID - Rocephin and Flagyl for open pelvic FX VTE - PAS for now, likely Lovenox tomorrow FEN - start TF, replete hypokalemia Dispo - ICU, OR Critical Care Total Time*: 34 Minutes  Violeta GelinasBurke Aarin Sparkman, MD, MPH, FACS Trauma: [phone] General Surgery: [phone]  08/02/2018  *Care during the described time interval was provided by me. I have reviewed this patient's available data, including medical history, events of note, physical examination and test results as part of my evaluation.
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
## 2018-08-02 NOTE — Transfer of Care (Signed)
Immediate Anesthesia Transfer of Care Note  Patient: Olivia BaileyStephanie Melendez  Procedure(s) Performed: REMOVAL EXTERNAL FIXATION PELVIS (Bilateral ) OPEN REDUCTION INTERNAL FIXATION (ORIF) PELVIC FRACTURE (N/A ) SACRO-ILIAC PINNING (Bilateral )  Patient Location: PACU  Anesthesia Type:General  Level of Consciousness: Patient remains intubated per anesthesia plan  Airway & Oxygen Therapy: Patient remains intubated per anesthesia plan and Patient placed on Ventilator (see vital sign flow sheet for setting)  Post-op Assessment: Report given to RN and Post -op Vital signs reviewed and stable  Post vital signs: Reviewed and stable  Last Vitals:  Vitals Value Taken Time  BP    Temp    Pulse    Resp    SpO2      Last Pain:  Vitals:   08/02/18 0800  TempSrc: Axillary  PainSc:          Complications: No apparent anesthesia complications
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
## 2018-08-03 ENCOUNTER — Inpatient Hospital Stay (HOSPITAL_COMMUNITY): Payer: Medicaid Other

## 2018-08-03 LAB — BASIC METABOLIC PANEL
Anion gap: 8 (ref 5–15)
BUN: 9 mg/dL (ref 6–20)
CALCIUM: 7.5 mg/dL — AB (ref 8.9–10.3)
CO2: 25 mmol/L (ref 22–32)
Chloride: 105 mmol/L (ref 98–111)
Creatinine, Ser: 0.49 mg/dL (ref 0.44–1.00)
GFR calc Af Amer: >60 mL/min (ref >60)
Glucose, Bld: 118 mg/dL — ABNORMAL HIGH (ref 70–99)
Potassium: 3.8 mmol/L (ref 3.5–5.1)
Sodium: 138 mmol/L (ref 135–145)

## 2018-08-03 LAB — CBC
HCT: 33.5 % — ABNORMAL LOW (ref 36.0–46.0)
Hemoglobin: 11.2 g/dL — ABNORMAL LOW (ref 12.0–15.0)
MCH: 31.3 pg (ref 26.0–34.0)
MCHC: 33.4 g/dL (ref 30.0–36.0)
MCV: 93.6 fL (ref 80.0–100.0)
NRBC: 0 % (ref 0.0–0.2)
Platelets: 139 10*3/uL — ABNORMAL LOW (ref 150–400)
RBC: 3.58 MIL/uL — ABNORMAL LOW (ref 3.87–5.11)
RDW: 14.5 % (ref 11.5–15.5)
WBC: 10.6 10*3/uL — ABNORMAL HIGH (ref 4.0–10.5)

## 2018-08-03 LAB — GLUCOSE, CAPILLARY
GLUCOSE-CAPILLARY: 98 mg/dL (ref 70–99)
Glucose-Capillary: 80 mg/dL (ref 70–99)
Glucose-Capillary: 99 mg/dL (ref 70–99)
Glucose-Capillary: 115 mg/dL — ABNORMAL HIGH (ref 70–99)
Glucose-Capillary: 130 mg/dL — ABNORMAL HIGH (ref 70–99)
Glucose-Capillary: 120 mg/dL — ABNORMAL HIGH (ref 70–99)
Glucose-Capillary: 95 mg/dL (ref 70–99)

## 2018-08-03 MED ORDER — FUROSEMIDE 10 MG/ML IJ SOLN
40.0000 mg | Freq: Once | INTRAMUSCULAR | Status: AC
  Administered 2018-08-03: 40 mg via INTRAVENOUS
  Filled 2018-08-03: qty 4

## 2018-08-03 MED ORDER — HYDROMORPHONE HCL 1 MG/ML IJ SOLN
0.5000 mg | INTRAMUSCULAR | Status: DC | PRN
  Administered 2018-08-03 – 2018-08-04 (×7): 1 mg via INTRAVENOUS
  Filled 2018-08-03 (×7): qty 1

## 2018-08-03 MED ORDER — HYDROMORPHONE HCL 1 MG/ML IJ SOLN
INTRAMUSCULAR | Status: AC
  Filled 2018-08-03: qty 1

## 2018-08-03 MED ORDER — ENOXAPARIN SODIUM 40 MG/0.4ML ~~LOC~~ SOLN
40.0000 mg | SUBCUTANEOUS | Status: DC
  Administered 2018-08-03 – 2018-08-09 (×7): 40 mg via SUBCUTANEOUS
  Filled 2018-08-03 (×7): qty 0.4

## 2018-08-03 NOTE — Progress Notes (Addendum)
Rt will continue to follow
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
## 2018-08-03 NOTE — Progress Notes (Signed)
Pt extubated to 6L [place], no issues. Pt able to voice name, responding to commands. Will continue to monitor pt.  200 mL Fentanyl wasted with Guss BundeAshley Elliot, RN
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
## 2018-08-03 NOTE — Progress Notes (Signed)
ETT secure. No breakdown noted. Moved ETT to pts center. RT will cont to monitor

## 2018-08-03 NOTE — Progress Notes (Signed)
RT decreased FIO2 to 3L [place] due to stable sats

## 2018-08-03 NOTE — Procedures (Signed)
Extubation Procedure Note  Patient Details:   Name: Olivia BaileyStephanie Wingrove DOB: [birth year] MRN: [id number]   Airway Documentation:    Vent end date: 08/03/18 Vent end time: 1012   Evaluation  O2 sats: 95 Complications: No apparent complications Patient did tolerate procedure well. Bilateral Breath Sounds: Clear, Diminished   Per Dr Lindie SpruceWyatt ok to extubate. Pt has a good cough/gag, minimal secretions, positive cuff leak. Oral sxn done. Deflated cuff- extubated pt to 6L with no complications. RN at bedside. No stridor. BBS clear. Pt in no distress. Pt tol well   Yes  Kandis NabHester, Pacey Willadsen Lynn 08/03/2018, 10:20 AM

## 2018-08-03 NOTE — Progress Notes (Signed)
Per Dr Lindie SpruceWyatt ok to wean. RT placed pt on CPAP/PSV 5/5, 40 with no complications. RT will cont to follow
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
## 2018-08-03 NOTE — Anesthesia Postprocedure Evaluation (Signed)
Anesthesia Post Note  Patient: Olivia Melendez  Procedure(s) Performed: REMOVAL EXTERNAL FIXATION PELVIS (Bilateral ) OPEN REDUCTION INTERNAL FIXATION (ORIF) PELVIC FRACTURE (N/A ) SACRO-ILIAC PINNING (Bilateral )     Patient location during evaluation: PACU Anesthesia Type: General Level of consciousness: patient remains intubated per anesthesia plan Pain management: pain level controlled Vital Signs Assessment: post-procedure vital signs reviewed and stable Respiratory status: patient remains intubated per anesthesia plan Cardiovascular status: stable Postop Assessment: no apparent nausea or vomiting Anesthetic complications: no    Last Vitals:  Vitals:   08/03/18 0700 08/03/18 0750  BP: 126/69 (!) 142/83  Pulse: 98 (!) 101  Resp: 18 19  Temp:  37.8 C  SpO2: 96% 97%    Last Pain:  Vitals:   08/03/18 0750  TempSrc: Axillary  PainSc:    Pain Goal:                 Olivia Melendez

## 2018-08-03 NOTE — Progress Notes (Signed)
Follow up - Trauma and Critical Care  Patient Details:    Olivia BaileyStephanie Melendez is an 37 y.o. female.  Lines/tubes : Airway 7.5 mm (Active)  Secured at (cm) 23 cm 08/03/2018  3:24 AM  Measured From Lips 08/03/2018  3:24 AM  Secured Location Right 08/03/2018  3:24 AM  Secured By [employer] Tube Holder 08/03/2018  3:24 AM  Tube Holder Repositioned Yes 08/03/2018  3:24 AM  Cuff Pressure (cm H2O) 28 cm H2O 08/02/2018  8:15 PM  Site Condition Dry 08/03/2018  3:24 AM     Closed System Drain 1 Anterior;Midline Hip Bulb (JP) 15 Fr. (Active)  Output (mL) 50 mL 08/03/2018  6:00 AM     NG/OG Tube Orogastric 18 Fr. Center mouth Aucultation (Active)  Site Assessment Clean;Dry;Intact 08/03/2018  4:58 AM  Ongoing Placement Verification No change in cm markings or external length of tube from initial placement;No acute changes, not attributed to clinical condition;No change in respiratory status;Xray 08/03/2018  4:58 AM  Status Suction-low intermittent 08/03/2018  4:58 AM  Amount of suction 90 mmHg 08/03/2018  4:58 AM  Drainage Appearance Green 07/31/2018  8:00 PM  Intake (mL) 30 mL 08/02/2018  6:00 PM  Output (mL) 75 mL 08/01/2018 12:00 PM     Urethral Catheter t. manny, md Coude;Straight-tip 22 Fr. (Active)  Indication for Insertion or Continuance of Catheter Bladder outlet obstruction / other urologic reason 08/02/2018  8:00 PM  Site Assessment Clean;Intact 08/02/2018  8:00 PM  Catheter Maintenance Bag below level of bladder;Catheter secured;Drainage bag/tubing not touching floor;No dependent loops 08/02/2018  8:00 PM  Collection Container Leg bag 08/02/2018  8:00 PM  Securement Method Securing device (Describe) 08/02/2018  8:00 PM  Output (mL) 200 mL 08/03/2018  6:00 AM    Microbiology/Sepsis markers: No results found for this or any previous visit.  Anti-infectives:  Anti-infectives (From admission, onward)   Start     Dose/Rate Route Frequency Ordered Stop   08/02/18 2000  cefTRIAXone  (ROCEPHIN) 2 g in sodium chloride 0.9 % 100 mL IVPB     2 g 200 mL/hr over 30 Minutes Intravenous Every 24 hours 08/02/18 1850 08/05/18 1959   08/02/18 1419  tobramycin (NEBCIN) powder  Status:  Discontinued       As needed 08/02/18 1419 08/02/18 1511   08/02/18 1418  vancomycin (VANCOCIN) powder  Status:  Discontinued       As needed 08/02/18 1418 08/02/18 1511   07/31/18 0100  cefTRIAXone (ROCEPHIN) 2 g in sodium chloride 0.9 % 100 mL IVPB     2 g 200 mL/hr over 30 Minutes Intravenous Every 24 hours 07/30/18 0906 08/02/18 0200   07/30/18 1458  vancomycin (VANCOCIN) powder  Status:  Discontinued       As needed 07/30/18 1458 07/30/18 1629   07/30/18 1457  tobramycin (NEBCIN) powder  Status:  Discontinued       As needed 07/30/18 1457 07/30/18 1629   07/30/18 1000  metroNIDAZOLE (FLAGYL) IVPB 500 mg     500 mg 100 mL/hr over 60 Minutes Intravenous Every 8 hours 07/30/18 0930 08/02/18 0330   07/30/18 0015  ceFAZolin (ANCEF) IVPB 2g/100 mL premix  Status:  Discontinued     2 g 200 mL/hr over 30 Minutes Intravenous Every 8 hours 07/30/18 0013 07/30/18 0906   07/29/18 2345  cefTRIAXone (ROCEPHIN) 2 g in sodium chloride 0.9 % 100 mL IVPB     2 g 200 mL/hr over 30 Minutes Intravenous  Once 07/29/18 2336 07/30/18 0152  07/29/18 2345  cefTRIAXone (ROCEPHIN) 1 g in sodium chloride 0.9 % 100 mL IVPB  Status:  Discontinued     1 g 200 mL/hr over 30 Minutes Intravenous Every 24 hours 07/29/18 2336 07/29/18 2338      Best Practice/Protocols:  VTE Prophylaxis: Mechanical GI Prophylaxis: Proton Pump Inhibitor Continous Sedation  Consults: Treatment Team:  [facility], [place], MD Haddix, Gillie MannersKevin P, MD Julio SicksPool, Henry, MD    Events:  Subjective:    Overnight Issues: Sedation has been weaned down.  Patient still very sedated  Objective:  Vital signs for last 24 hours: Temp:  [98 F (36.7 C)-102 F (38.9 C)] 100.2 F (37.9 C) (12/20 0444) Pulse Rate:  [84-103] 98 (12/20  0700) Resp:  [18-20] 18 (12/20 0700) BP: (107-147)/(62-88) 126/69 (12/20 0700) SpO2:  [96 %-100 %] 96 % (12/20 0700) FiO2 (%):  [40 %] 40 % (12/20 0324) Weight:  [81.1 kg] 81.1 kg (12/20 0500)  Hemodynamic parameters for last 24 hours:    Intake/Output from previous day: 12/19 0701 - 12/20 0700 In: 3315.5 [I.V.:2670.2; NG/GT:371; IV Piggyback:274.3] Out: 2650 [Urine:2285; Drains:165; Blood:200]  Intake/Output this shift: No intake/output data recorded.  Vent settings for last 24 hours: Vent Mode: PRVC FiO2 (%):  [40 %] 40 % Set Rate:  [18 bmp] 18 bmp Vt Set:  [410 mL] 410 mL PEEP:  [5 cmH20] 5 cmH20 Plateau Pressure:  [15 cmH20-19 cmH20] 19 cmH20  Physical Exam:  General: no respiratory distress Neuro: nonfocal exam and RASS -1 HEENT/Neck: no JVD, ETT WNL  and PERRL Resp: clear to auscultation bilaterally and Oxygenation is good.  On FIO2 40%, oxygen saturations 97% CVS: regular rate and rhythm, S1, S2 normal, no murmur, click, rub or gallop GI: Good bowel sounds and tolerating tube feedings well. Extremities: edema 2+, unequal size and left upper extremity is more swollen than her right.  Results for orders placed or performed during the hospital encounter of 07/29/18 (from the past 24 hour(s))  Glucose, capillary     Status: None   Collection Time: 08/02/18  4:09 PM  Result Value Ref Range   Glucose-Capillary 78 70 - 99 mg/dL   Comment 1 Notify RN    Comment 2 Document in Chart   Glucose, capillary     Status: None   Collection Time: 08/02/18  8:32 PM  Result Value Ref Range   Glucose-Capillary 80 70 - 99 mg/dL   Comment 1 Notify RN    Comment 2 Document in Chart   Glucose, capillary     Status: None   Collection Time: 08/03/18 12:42 AM  Result Value Ref Range   Glucose-Capillary 98 70 - 99 mg/dL   Comment 1 Notify RN    Comment 2 Document in Chart   CBC     Status: Abnormal   Collection Time: 08/03/18  4:13 AM  Result Value Ref Range   WBC 10.6 (H) 4.0 -  10.5 K/uL   RBC 3.58 (L) 3.87 - 5.11 MIL/uL   Hemoglobin 11.2 (L) 12.0 - 15.0 g/dL   HCT 16.133.5 (L) 09.636.0 - 04.546.0 %   MCV 93.6 80.0 - 100.0 fL   MCH 31.3 26.0 - 34.0 pg   MCHC 33.4 30.0 - 36.0 g/dL   RDW 40.914.5 81.111.5 - 91.415.5 %   Platelets 139 (L) 150 - 400 K/uL   nRBC 0.0 0.0 - 0.2 %  Basic metabolic panel     Status: Abnormal   Collection Time: 08/03/18  4:13 AM  Result  Value Ref Range   Sodium 138 135 - 145 mmol/L   Potassium 3.8 3.5 - 5.1 mmol/L   Chloride 105 98 - 111 mmol/L   CO2 25 22 - 32 mmol/L   Glucose, Bld 118 (H) 70 - 99 mg/dL   BUN 9 6 - 20 mg/dL   Creatinine, Ser 8.29 0.44 - 1.00 mg/dL   Calcium 7.5 (L) 8.9 - 10.3 mg/dL   GFR calc non Af Amer >60 >60 mL/min   GFR calc Af Amer >60 >60 mL/min   Anion gap 8 5 - 15  Glucose, capillary     Status: None   Collection Time: 08/03/18  4:43 AM  Result Value Ref Range   Glucose-Capillary 99 70 - 99 mg/dL   Comment 1 Notify RN    Comment 2 Document in Chart   Glucose, capillary     Status: Abnormal   Collection Time: 08/03/18  7:33 AM  Result Value Ref Range   Glucose-Capillary 115 (H) 70 - 99 mg/dL     Assessment/Plan:   NEURO  Altered Mental Status:  sedation   Plan: Wean sedatives for potential extubation today.  PULM  Atelectasis/collapse (focal and plate-like atelectasis on the right)   Plan: Still attempt to wean for extubation today.  CARDIO  No significant issues   Plan: CPM  RENAL  Hypervolemia   Plan: May benefit from diuresis  GI  No specific issues   Plan: Hold tube feedings for extubation later today  ID  No known infectious source, but patient on perioperative antibiotics.   Plan: Continue antibiotics per perioperative coverage  HEME  Anemia acute blood loss anemia)   Plan: Controlled and does not need further transfusion  ENDO No known issues   Plan: No changes  Global Issues  Should be able to get the patient extubated today.  Will need to decrease sedation and wean the ventilator for potential  extubation later this morning or this afternoon.  Labs have been stable and will not write for labs for tomorrow, but Sunday.  Also no need for repeat CXR tomorrow.    LOS: 5 days   Additional comments:I reviewed the patient's new clinical lab test results. cbc/bmet, I reviewed the patients new imaging test results. cxr and I have discussed and reviewed with family members patient's mother  Critical Care Total Time*: 30 Minutes  Jimmye Norman 08/03/2018  *Care during the described time interval was provided by me and/or other providers on the critical care team.  I have reviewed this patient's available data, including medical history, events of note, physical examination and test results as part of my evaluation.

## 2018-08-04 LAB — URINALYSIS, MICROSCOPIC (REFLEX)
RBC / HPF: >50 RBC/hpf (ref 0–5)
WBC, UA: >50 WBC/hpf (ref 0–5)

## 2018-08-04 LAB — GLUCOSE, CAPILLARY
GLUCOSE-CAPILLARY: 115 mg/dL — AB (ref 70–99)
Glucose-Capillary: 100 mg/dL — ABNORMAL HIGH (ref 70–99)
Glucose-Capillary: 106 mg/dL — ABNORMAL HIGH (ref 70–99)
Glucose-Capillary: 108 mg/dL — ABNORMAL HIGH (ref 70–99)
Glucose-Capillary: 118 mg/dL — ABNORMAL HIGH (ref 70–99)
Glucose-Capillary: 93 mg/dL (ref 70–99)
Glucose-Capillary: 95 mg/dL (ref 70–99)

## 2018-08-04 LAB — URINALYSIS, ROUTINE W REFLEX MICROSCOPIC
BILIRUBIN URINE: NEGATIVE
Glucose, UA: NEGATIVE mg/dL
Ketones, ur: NEGATIVE mg/dL
Nitrite: NEGATIVE
Protein, ur: 30 mg/dL — AB
Specific Gravity, Urine: 1.01 (ref 1.005–1.030)
pH: >9 — ABNORMAL HIGH (ref 5.0–8.0)

## 2018-08-04 MED ORDER — DOCUSATE SODIUM 100 MG PO CAPS
100.0000 mg | ORAL_CAPSULE | Freq: Two times a day (BID) | ORAL | Status: DC | PRN
  Administered 2018-08-04 – 2018-08-06 (×3): 100 mg via ORAL
  Filled 2018-08-04 (×3): qty 1

## 2018-08-04 MED ORDER — METHOCARBAMOL 500 MG PO TABS
500.0000 mg | ORAL_TABLET | Freq: Three times a day (TID) | ORAL | Status: DC
  Administered 2018-08-04 – 2018-08-10 (×20): 500 mg via ORAL
  Filled 2018-08-04 (×20): qty 1

## 2018-08-04 MED ORDER — KETOROLAC TROMETHAMINE 15 MG/ML IJ SOLN
15.0000 mg | Freq: Four times a day (QID) | INTRAMUSCULAR | Status: AC
  Administered 2018-08-04 – 2018-08-06 (×8): 15 mg via INTRAVENOUS
  Filled 2018-08-04 (×8): qty 1

## 2018-08-04 MED ORDER — KETOROLAC TROMETHAMINE 30 MG/ML IJ SOLN
30.0000 mg | Freq: Once | INTRAMUSCULAR | Status: DC
  Filled 2018-08-04: qty 1

## 2018-08-04 MED ORDER — ACETAMINOPHEN 500 MG PO TABS
1000.0000 mg | ORAL_TABLET | Freq: Three times a day (TID) | ORAL | Status: DC
  Administered 2018-08-04 – 2018-08-10 (×18): 1000 mg via ORAL
  Filled 2018-08-04 (×19): qty 2

## 2018-08-04 MED ORDER — HYDROMORPHONE HCL 1 MG/ML IJ SOLN
0.5000 mg | INTRAMUSCULAR | Status: DC | PRN
  Administered 2018-08-04 – 2018-08-07 (×8): 1 mg via INTRAVENOUS
  Filled 2018-08-04 (×8): qty 1

## 2018-08-04 MED ORDER — TRAMADOL HCL 50 MG PO TABS
50.0000 mg | ORAL_TABLET | Freq: Two times a day (BID) | ORAL | Status: DC
  Administered 2018-08-04 – 2018-08-06 (×6): 50 mg via ORAL
  Filled 2018-08-04 (×6): qty 1

## 2018-08-04 MED ORDER — DOCUSATE SODIUM 50 MG/5ML PO LIQD: 100.0000 mg | Freq: Two times a day (BID) | ORAL | Status: DC | PRN

## 2018-08-04 MED ORDER — ORAL CARE MOUTH RINSE
15.0000 mL | Freq: Two times a day (BID) | OROMUCOSAL | Status: DC
  Administered 2018-08-04 – 2018-08-07 (×7): 15 mL via OROMUCOSAL

## 2018-08-04 MED ORDER — OXYCODONE HCL 5 MG PO TABS
5.0000 mg | ORAL_TABLET | ORAL | Status: DC | PRN
  Administered 2018-08-04 – 2018-08-07 (×8): 10 mg via ORAL
  Filled 2018-08-04 (×8): qty 2

## 2018-08-04 NOTE — Progress Notes (Signed)
Subjective: Patient reports doing better  Objective: Vital signs in last 24 hours: Temp:  [98.5 F (36.9 C)-100.6 F (38.1 C)] 98.5 F (36.9 C) (12/21 0800) Pulse Rate:  [91-125] 94 (12/21 0800) Resp:  [14-31] 17 (12/21 0800) BP: (126-163)/(70-107) 142/79 (12/21 0800) SpO2:  [95 %-100 %] 99 % (12/21 0800)  Intake/Output from previous day: 12/20 0701 - 12/21 0700 In: 750.2 [I.V.:545.1; NG/GT:105; IV Piggyback:100.1] Out: [id number] [Urine:3675; Emesis/NG output:200; Drains:50] Intake/Output this shift: Total I/O In: 9.5 [I.V.:9.5] Out: -   Physical Exam: On edge of bed, working with PT  Lab Results: Recent Labs    08/02/18 0424 08/03/18 0413  WBC 9.7 10.6*  HGB 9.9* 11.2*  HCT 29.8* 33.5*  PLT 107* 139*   BMET Recent Labs    08/02/18 0424 08/03/18 0413  NA 140 138  K 3.3* 3.8  CL 108 105  CO2 26 25  GLUCOSE 115* 118*  BUN 11 9  CREATININE 0.52 0.49  CALCIUM 7.6* 7.5*    Studies/Results: Ct Pelvis Wo Contrast  Result Date: 08/02/2018 CLINICAL DATA:  Follow-up of pelvic fracture post fixation. EXAM: CT PELVIS WITHOUT CONTRAST TECHNIQUE: Multidetector CT imaging of the pelvis was performed following the standard protocol without intravenous contrast. COMPARISON:  Pelvis 08/02/2018. CT 07/29/2018. FINDINGS: Urinary Tract:  Foley catheter decompresses the bladder. Bowel: Visualized portions of small and large bowel are not abnormally distended. Vascular/Lymphatic: Limited visualization on noncontrast imaging. No obvious aneurysm identified. Reproductive:  Uterus and ovaries are not enlarged. Other: There is residual intra and extraperitoneal fluid in the pelvis likely representing residual postoperative or posttraumatic fluid collections. Soft tissue stranding, fluid, and gas in the anterior pelvic wall and in the right groin also likely postoperative or posttraumatic fluid collections. Soft tissue fluid and gas is decreased since the preoperative study. No enlarging or  loculated collections identified. Surgical drain in the low pelvis anterior to the symphysis pubis. Musculoskeletal: Screw fixations of the sacroiliac joints with symmetrical appearance. Plate and screw fixation of the symphysis pubis with improved alignment. Comminuted fractures of the bilateral superior and inferior pubic rami with fractures of the medial right acetabulum. Minimal displacement. IMPRESSION: Comminuted fractures of the bilateral superior and inferior pubic rami with fractures of the medial right acetabulum. Plate and screw fixation of the symphysis pubis with improved alignment. Residual intra and extraperitoneal fluid in the pelvis and right groin likely representing postoperative or posttraumatic fluid collections. No enlarging or loculated collections identified. Electronically Signed   By: Burman NievesWilliam  Stevens M.D.   On: 08/02/2018 22:20   Dg Pelvis Comp Min 3v  Result Date: 08/02/2018 CLINICAL DATA:  Postoperative images after pelvic fracture fixation. EXAM: JUDET PELVIS - 3+ VIEW COMPARISON:  Intraoperative fluoroscopy 08/02/2018. Pelvis 07/30/2018. FINDINGS: Postoperative changes in the pelvis with left and right fixation screws across the individual sacroiliac joints and additional single fixation screw crossing both SI joints. Plate and screw fixation of the symphysis pubis and left superior pubic ramus/innominate bones. Surgical drain. Surgical clips in the left pelvis. Metallic clips projected over the soft tissues lateral to the left iliac crest. The SI joints appear symmetrical and the symphysis pubis demonstrates improved alignment since the preoperative study. Again demonstrated are superior and inferior pubic ramus fractures bilaterally. Degenerative changes in the hips. Soft tissue gas is likely postoperative. IMPRESSION: Postoperative changes in the pelvis as described, with internal fixation of the SI joints and symphysis pubis. Electronically Signed   By: Burman NievesWilliam  Stevens M.D.    On: 08/02/2018 22:12  Dg Pelvis Comp Min 3v  Result Date: 08/02/2018 CLINICAL DATA:  Pelvic fracture EXAM: DG C-ARM 61-120 MIN; JUDET PELVIS - 3+ VIEW COMPARISON:  07/30/2018, CT 07/29/2018 FINDINGS: Twenty-four low resolution intraoperative spot views of the pelvis. Total fluoroscopy time was 356.1 seconds. Initial images demonstrate external fixation device in place. Subsequent placement of individual fixating screws across the SI joints. Single long fixating screw across both SI joints. Plate and screw fixation across the pubic symphysis and superior pubic rami. Multiple pelvic fractures are noted. IMPRESSION: Intraoperative fluoroscopic assistance provided during surgical fixation of pelvic fractures. Electronically Signed   By: Jasmine PangKim  Fujinaga M.D.   On: 08/02/2018 15:24   Dg Chest Port 1 View  Result Date: 08/03/2018 CLINICAL DATA:  ET tube EXAM: PORTABLE CHEST 1 VIEW COMPARISON:  08/01/2018 FINDINGS: Heart is normal size. Left lower lobe atelectasis or infiltrate. Mild vascular congestion. Right perihilar atelectasis. No visible effusions. Support devices are stable. IMPRESSION: Left lower lobe atelectasis or infiltrate. Right perihilar atelectasis. No significant change since prior study. Electronically Signed   By: Charlett NoseKevin  Dover M.D.   On: 08/03/2018 07:22   Dg C-arm 1-60 Min  Result Date: 08/02/2018 CLINICAL DATA:  Pelvic fracture EXAM: DG C-ARM 61-120 MIN; JUDET PELVIS - 3+ VIEW COMPARISON:  07/30/2018, CT 07/29/2018 FINDINGS: Twenty-four low resolution intraoperative spot views of the pelvis. Total fluoroscopy time was 356.1 seconds. Initial images demonstrate external fixation device in place. Subsequent placement of individual fixating screws across the SI joints. Single long fixating screw across both SI joints. Plate and screw fixation across the pubic symphysis and superior pubic rami. Multiple pelvic fractures are noted. IMPRESSION: Intraoperative fluoroscopic assistance provided  during surgical fixation of pelvic fractures. Electronically Signed   By: Jasmine PangKim  Fujinaga M.D.   On: 08/02/2018 15:24    Assessment/Plan: Improving.  Doing well after ORIF pelvic fracture.  Stable to improving from standpoint of head injury.    LOS: 6 days    Dorian HeckleJoseph D Vaneta Hammontree, MD 08/04/2018, 10:19 AM
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
## 2018-08-04 NOTE — Progress Notes (Signed)
Subjective: 2 Days Post-Op Procedure(s) (LRB): REMOVAL EXTERNAL FIXATION PELVIS (Bilateral) OPEN REDUCTION INTERNAL FIXATION (ORIF) PELVIC FRACTURE (N/A) SACRO-ILIAC PINNING (Bilateral) Patient reports pain as 7 on 0-10 scale.    Objective: Vital signs in last 24 hours: Temp:  [98.5 F (36.9 C)-100.6 F (38.1 C)] 98.5 F (36.9 C) (12/21 0800) Pulse Rate:  [90-107] 90 (12/21 1100) Resp:  [13-23] 13 (12/21 1100) BP: (127-163)/(70-107) 152/85 (12/21 1100) SpO2:  [93 %-100 %] 93 % (12/21 1103)  Intake/Output from previous day: 12/20 0701 - 12/21 0700 In: 750.2 [I.V.:545.1; NG/GT:105; IV Piggyback:100.1] Out: 3925 [Urine:3675; Emesis/NG output:200; Drains:50] Intake/Output this shift: Total I/O In: 147.4 [P.O.:120; I.V.:27.4] Out: -   Recent Labs    08/02/18 0424 08/03/18 0413  HGB 9.9* 11.2*   Recent Labs    08/02/18 0424 08/03/18 0413  WBC 9.7 10.6*  RBC 3.25* 3.58*  HCT 29.8* 33.5*  PLT 107* 139*   Recent Labs    08/02/18 0424 08/03/18 0413  NA 140 138  K 3.3* 3.8  CL 108 105  CO2 26 25  BUN 11 9  CREATININE 0.52 0.49  GLUCOSE 115* 118*  CALCIUM 7.6* 7.5*   Recent Labs    08/02/18 0424  INR 1.11    ABD soft Sensation intact distally Intact pulses distally Dorsiflexion/Plantar flexion intact    Assessment/Plan: 2 Days Post-Op Procedure(s) (LRB): REMOVAL EXTERNAL FIXATION PELVIS (Bilateral) OPEN REDUCTION INTERNAL FIXATION (ORIF) PELVIC FRACTURE (N/A) SACRO-ILIAC PINNING (Bilateral)   JP drain intact   Waiting on urology for when to remove.  Supra pubic incision dressed with moderate drainage.  Will change that dressing tomorrow.  Other wounds have no excess drainage.  Patient under going speech pathology eval now.  Doing well with this.        Olivia Melendez J Olivia Melendez 08/04/2018, 11:32 AM

## 2018-08-04 NOTE — Progress Notes (Signed)
Trauma Service Note  Subjective: Patient awake, alert and oriented.  Only complining of bilateral leg pain, but cannot be very specific about exactly where.    Objective: Vital signs in last 24 hours: Temp:  [98.5 F (36.9 C)-100.6 F (38.1 C)] 100.6 F (38.1 C) (12/21 0428) Pulse Rate:  [91-132] 94 (12/21 0800) Resp:  [14-31] 17 (12/21 0800) BP: (126-163)/(70-107) 142/79 (12/21 0800) SpO2:  [95 %-100 %] 99 % (12/21 0800) FiO2 (%):  [40 %] 40 % (12/20 0945) Last BM Date: (PTA)  Intake/Output from previous day: 12/20 0701 - 12/21 0700 In: 750.2 [I.V.:545.1; NG/GT:105; IV Piggyback:100.1] Out: 3925 [Urine:3675; Emesis/NG output:200; Drains:50] Intake/Output this shift: Total I/O In: 9.5 [I.V.:9.5] Out: -   General: No acute distress  Lungs: Clear bilaterally  Abd: Soft, excellent bowel sounds.  Extremities: Bilateral LE edema.  Excellent pulses bilaterally.  Neuro: Intact  Lab Results: CBC  Recent Labs    08/02/18 0424 08/03/18 0413  WBC 9.7 10.6*  HGB 9.9* 11.2*  HCT 29.8* 33.5*  PLT 107* 139*   BMET Recent Labs    08/02/18 0424 08/03/18 0413  NA 140 138  K 3.3* 3.8  CL 108 105  CO2 26 25  GLUCOSE 115* 118*  BUN 11 9  CREATININE 0.52 0.49  CALCIUM 7.6* 7.5*   PT/INR Recent Labs    08/02/18 0424  LABPROT 14.2  INR 1.11   ABG No results for input(s): PHART, HCO3 in the last 72 hours.  Invalid input(s): PCO2, PO2  Studies/Results: Ct Pelvis Wo Contrast  Result Date: 08/02/2018 CLINICAL DATA:  Follow-up of pelvic fracture post fixation. EXAM: CT PELVIS WITHOUT CONTRAST TECHNIQUE: Multidetector CT imaging of the pelvis was performed following the standard protocol without intravenous contrast. COMPARISON:  Pelvis 08/02/2018. CT 07/29/2018. FINDINGS: Urinary Tract:  Foley catheter decompresses the bladder. Bowel: Visualized portions of small and large bowel are not abnormally distended. Vascular/Lymphatic: Limited visualization on noncontrast  imaging. No obvious aneurysm identified. Reproductive:  Uterus and ovaries are not enlarged. Other: There is residual intra and extraperitoneal fluid in the pelvis likely representing residual postoperative or posttraumatic fluid collections. Soft tissue stranding, fluid, and gas in the anterior pelvic wall and in the right groin also likely postoperative or posttraumatic fluid collections. Soft tissue fluid and gas is decreased since the preoperative study. No enlarging or loculated collections identified. Surgical drain in the low pelvis anterior to the symphysis pubis. Musculoskeletal: Screw fixations of the sacroiliac joints with symmetrical appearance. Plate and screw fixation of the symphysis pubis with improved alignment. Comminuted fractures of the bilateral superior and inferior pubic rami with fractures of the medial right acetabulum. Minimal displacement. IMPRESSION: Comminuted fractures of the bilateral superior and inferior pubic rami with fractures of the medial right acetabulum. Plate and screw fixation of the symphysis pubis with improved alignment. Residual intra and extraperitoneal fluid in the pelvis and right groin likely representing postoperative or posttraumatic fluid collections. No enlarging or loculated collections identified. Electronically Signed   By: Burman Nieves M.D.   On: 08/02/2018 22:20   Dg Pelvis Comp Min 3v  Result Date: 08/02/2018 CLINICAL DATA:  Postoperative images after pelvic fracture fixation. EXAM: JUDET PELVIS - 3+ VIEW COMPARISON:  Intraoperative fluoroscopy 08/02/2018. Pelvis 07/30/2018. FINDINGS: Postoperative changes in the pelvis with left and right fixation screws across the individual sacroiliac joints and additional single fixation screw crossing both SI joints. Plate and screw fixation of the symphysis pubis and left superior pubic ramus/innominate bones. Surgical drain. Surgical clips in  the left pelvis. Metallic clips projected over the soft tissues  lateral to the left iliac crest. The SI joints appear symmetrical and the symphysis pubis demonstrates improved alignment since the preoperative study. Again demonstrated are superior and inferior pubic ramus fractures bilaterally. Degenerative changes in the hips. Soft tissue gas is likely postoperative. IMPRESSION: Postoperative changes in the pelvis as described, with internal fixation of the SI joints and symphysis pubis. Electronically Signed   By: Burman NievesWilliam  Stevens M.D.   On: 08/02/2018 22:12   Dg Pelvis Comp Min 3v  Result Date: 08/02/2018 CLINICAL DATA:  Pelvic fracture EXAM: DG C-ARM 61-120 MIN; JUDET PELVIS - 3+ VIEW COMPARISON:  07/30/2018, CT 07/29/2018 FINDINGS: Twenty-four low resolution intraoperative spot views of the pelvis. Total fluoroscopy time was 356.1 seconds. Initial images demonstrate external fixation device in place. Subsequent placement of individual fixating screws across the SI joints. Single long fixating screw across both SI joints. Plate and screw fixation across the pubic symphysis and superior pubic rami. Multiple pelvic fractures are noted. IMPRESSION: Intraoperative fluoroscopic assistance provided during surgical fixation of pelvic fractures. Electronically Signed   By: Jasmine PangKim  Fujinaga M.D.   On: 08/02/2018 15:24   Dg Chest Port 1 View  Result Date: 08/03/2018 CLINICAL DATA:  ET tube EXAM: PORTABLE CHEST 1 VIEW COMPARISON:  08/01/2018 FINDINGS: Heart is normal size. Left lower lobe atelectasis or infiltrate. Mild vascular congestion. Right perihilar atelectasis. No visible effusions. Support devices are stable. IMPRESSION: Left lower lobe atelectasis or infiltrate. Right perihilar atelectasis. No significant change since prior study. Electronically Signed   By: Charlett NoseKevin  Dover M.D.   On: 08/03/2018 07:22   Dg C-arm 1-60 Min  Result Date: 08/02/2018 CLINICAL DATA:  Pelvic fracture EXAM: DG C-ARM 61-120 MIN; JUDET PELVIS - 3+ VIEW COMPARISON:  07/30/2018, CT 07/29/2018  FINDINGS: Twenty-four low resolution intraoperative spot views of the pelvis. Total fluoroscopy time was 356.1 seconds. Initial images demonstrate external fixation device in place. Subsequent placement of individual fixating screws across the SI joints. Single long fixating screw across both SI joints. Plate and screw fixation across the pubic symphysis and superior pubic rami. Multiple pelvic fractures are noted. IMPRESSION: Intraoperative fluoroscopic assistance provided during surgical fixation of pelvic fractures. Electronically Signed   By: Jasmine PangKim  Fujinaga M.D.   On: 08/02/2018 15:24    Anti-infectives: Anti-infectives (From admission, onward)   Start     Dose/Rate Route Frequency Ordered Stop   08/02/18 2000  cefTRIAXone (ROCEPHIN) 2 g in sodium chloride 0.9 % 100 mL IVPB     2 g 200 mL/hr over 30 Minutes Intravenous Every 24 hours 08/02/18 1850 08/05/18 1959   08/02/18 1419  tobramycin (NEBCIN) powder  Status:  Discontinued       As needed 08/02/18 1419 08/02/18 1511   08/02/18 1418  vancomycin (VANCOCIN) powder  Status:  Discontinued       As needed 08/02/18 1418 08/02/18 1511   07/31/18 0100  cefTRIAXone (ROCEPHIN) 2 g in sodium chloride 0.9 % 100 mL IVPB     2 g 200 mL/hr over 30 Minutes Intravenous Every 24 hours 07/30/18 0906 08/02/18 0200   07/30/18 1458  vancomycin (VANCOCIN) powder  Status:  Discontinued       As needed 07/30/18 1458 07/30/18 1629   07/30/18 1457  tobramycin (NEBCIN) powder  Status:  Discontinued       As needed 07/30/18 1457 07/30/18 1629   07/30/18 1000  metroNIDAZOLE (FLAGYL) IVPB 500 mg     500 mg 100 mL/hr  over 60 Minutes Intravenous Every 8 hours 07/30/18 0930 08/02/18 0330   07/30/18 0015  ceFAZolin (ANCEF) IVPB 2g/100 mL premix  Status:  Discontinued     2 g 200 mL/hr over 30 Minutes Intravenous Every 8 hours 07/30/18 0013 07/30/18 0906   07/29/18 2345  cefTRIAXone (ROCEPHIN) 2 g in sodium chloride 0.9 % 100 mL IVPB     2 g 200 mL/hr over 30 Minutes  Intravenous  Once 07/29/18 2336 07/30/18 0152   07/29/18 2345  cefTRIAXone (ROCEPHIN) 1 g in sodium chloride 0.9 % 100 mL IVPB  Status:  Discontinued     1 g 200 mL/hr over 30 Minutes Intravenous Every 24 hours 07/29/18 2336 07/29/18 2338      Assessment/Plan: s/p Procedure(s): REMOVAL EXTERNAL FIXATION PELVIS OPEN REDUCTION INTERNAL FIXATION (ORIF) PELVIC FRACTURE SACRO-ILIAC PINNING Low grade fever.  Will check UA and urine culture  Has no neck pain and CT neck negative on initial evaluation. Will remove collar and start full liquid diet. No labs today.   LOS: 6 days   Marta LamasJames O. Gae BonWyatt, III, MD, FACS [phone] Trauma Surgeon 08/04/2018
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
## 2018-08-05 ENCOUNTER — Other Ambulatory Visit: Payer: Self-pay

## 2018-08-05 ENCOUNTER — Encounter (HOSPITAL_COMMUNITY): Payer: Self-pay

## 2018-08-05 LAB — CBC WITH DIFFERENTIAL/PLATELET
ABS IMMATURE GRANULOCYTES: 0.54 10*3/uL — AB (ref 0.00–0.07)
Basophils Absolute: 0.1 10*3/uL (ref 0.0–0.1)
Basophils Relative: 0 %
Eosinophils Absolute: 0.3 10*3/uL (ref 0.0–0.5)
Eosinophils Relative: 3 %
HCT: 30.6 % — ABNORMAL LOW (ref 36.0–46.0)
HEMOGLOBIN: 10.1 g/dL — AB (ref 12.0–15.0)
Immature Granulocytes: 5 %
Lymphocytes Relative: 17 %
Lymphs Abs: 1.9 10*3/uL (ref 0.7–4.0)
MCH: 30.8 pg (ref 26.0–34.0)
MCHC: 33 g/dL (ref 30.0–36.0)
MCV: 93.3 fL (ref 80.0–100.0)
Monocytes Absolute: 1.2 10*3/uL — ABNORMAL HIGH (ref 0.1–1.0)
Monocytes Relative: 11 %
NEUTROS ABS: 7.2 10*3/uL (ref 1.7–7.7)
Neutrophils Relative %: 64 %
Platelets: 230 10*3/uL (ref 150–400)
RBC: 3.28 MIL/uL — ABNORMAL LOW (ref 3.87–5.11)
RDW: 13.6 % (ref 11.5–15.5)
WBC: 11.2 10*3/uL — ABNORMAL HIGH (ref 4.0–10.5)
nRBC: 0 % (ref 0.0–0.2)

## 2018-08-05 LAB — BASIC METABOLIC PANEL
Anion gap: 9 (ref 5–15)
BUN: 10 mg/dL (ref 6–20)
CHLORIDE: 101 mmol/L (ref 98–111)
CO2: 28 mmol/L (ref 22–32)
Calcium: 8.2 mg/dL — ABNORMAL LOW (ref 8.9–10.3)
Creatinine, Ser: 0.54 mg/dL (ref 0.44–1.00)
GFR calc non Af Amer: >60 mL/min (ref >60)
Glucose, Bld: 101 mg/dL — ABNORMAL HIGH (ref 70–99)
Potassium: 3.6 mmol/L (ref 3.5–5.1)
Sodium: 138 mmol/L (ref 135–145)

## 2018-08-05 LAB — URINE CULTURE
Culture: NO GROWTH
Special Requests: NORMAL

## 2018-08-05 LAB — GLUCOSE, CAPILLARY
Glucose-Capillary: 108 mg/dL — ABNORMAL HIGH (ref 70–99)
Glucose-Capillary: 97 mg/dL (ref 70–99)

## 2018-08-05 MED ORDER — INFLUENZA VAC SPLIT QUAD 0.5 ML IM SUSY
0.5000 mL | PREFILLED_SYRINGE | INTRAMUSCULAR | Status: AC
  Administered 2018-08-10: 0.5 mL via INTRAMUSCULAR
  Filled 2018-08-05: qty 0.5

## 2018-08-05 NOTE — Progress Notes (Signed)
Trauma Service Note  Subjective: Patient much better.  No acute distress.  Did sit up at the side of the bed yesterday with PT.  Objective: Vital signs in last 24 hours: Temp:  [98.1 F (36.7 C)-98.9 F (37.2 C)] 98.9 F (37.2 C) (12/22 0359) Pulse Rate:  [76-102] 99 (12/22 0700) Resp:  [13-22] 16 (12/22 0700) BP: (122-157)/(63-102) 152/91 (12/22 0700) SpO2:  [89 %-100 %] 92 % (12/22 0700) Last BM Date: (PTA)  Intake/Output from previous day: 12/21 0701 - 12/22 0700 In: 257.6 [P.O.:120; I.V.:37.6; IV Piggyback:100.1] Out: 1610 [RUEAV:[id number] [Urine:1675; Drains:10] Intake/Output this shift: No intake/output data recorded.  General: No acute distress with mild nausea  Lungs: Clear  Abd: Soft, good woel sounds.  Extremities: No changes.  No clinical signs or symptoms of DVT  Prefers to lay with her legs separated for comfort.  No evidence of leakage from bladder.  Neuro: Intact  Lab Results: CBC  Recent Labs    08/03/18 0413 08/05/18 0453  WBC 10.6* 11.2*  HGB 11.2* 10.1*  HCT 33.5* 30.6*  PLT 139* 230   BMET Recent Labs    08/03/18 0413 08/05/18 0453  NA 138 138  K 3.8 3.6  CL 105 101  CO2 25 28  GLUCOSE 118* 101*  BUN 9 10  CREATININE 0.49 0.54  CALCIUM 7.5* 8.2*   PT/INR No results for input(s): LABPROT, INR in the last 72 hours. ABG No results for input(s): PHART, HCO3 in the last 72 hours.  Invalid input(s): PCO2, PO2  Studies/Results: No results found.  Anti-infectives: Anti-infectives (From admission, onward)   Start     Dose/Rate Route Frequency Ordered Stop   08/02/18 2000  cefTRIAXone (ROCEPHIN) 2 g in sodium chloride 0.9 % 100 mL IVPB     2 g 200 mL/hr over 30 Minutes Intravenous Every 24 hours 08/02/18 1850 08/04/18 2002   08/02/18 1419  tobramycin (NEBCIN) powder  Status:  Discontinued       As needed 08/02/18 1419 08/02/18 1511   08/02/18 1418  vancomycin (VANCOCIN) powder  Status:  Discontinued       As needed 08/02/18 1418 08/02/18 1511   07/31/18 0100  cefTRIAXone (ROCEPHIN) 2 g in sodium chloride 0.9 % 100 mL IVPB     2 g 200 mL/hr over 30 Minutes Intravenous Every 24 hours 07/30/18 0906 08/02/18 0200   07/30/18 1458  vancomycin (VANCOCIN) powder  Status:  Discontinued       As needed 07/30/18 1458 07/30/18 1629   07/30/18 1457  tobramycin (NEBCIN) powder  Status:  Discontinued       As needed 07/30/18 1457 07/30/18 1629   07/30/18 1000  metroNIDAZOLE (FLAGYL) IVPB 500 mg     500 mg 100 mL/hr over 60 Minutes Intravenous Every 8 hours 07/30/18 0930 08/02/18 0330   07/30/18 0015  ceFAZolin (ANCEF) IVPB 2g/100 mL premix  Status:  Discontinued     2 g 200 mL/hr over 30 Minutes Intravenous Every 8 hours 07/30/18 0013 07/30/18 0906   07/29/18 2345  cefTRIAXone (ROCEPHIN) 2 g in sodium chloride 0.9 % 100 mL IVPB     2 g 200 mL/hr over 30 Minutes Intravenous  Once 07/29/18 2336 07/30/18 0152   07/29/18 2345  cefTRIAXone (ROCEPHIN) 1 g in sodium chloride 0.9 % 100 mL IVPB  Status:  Discontinued     1 g 200 mL/hr over 30 Minutes Intravenous Every 24 hours 07/29/18 2336 07/29/18 2338      Assessment/Plan: s/p Procedure(s): REMOVAL EXTERNAL FIXATION  PELVIS OPEN REDUCTION INTERNAL FIXATION (ORIF) PELVIC FRACTURE SACRO-ILIAC PINNING Advance diet  LOS: 7 days   Marta LamasJames O. Gae BonWyatt, III, MD, FACS [phone] Trauma Surgeon 08/05/2018

## 2018-08-05 NOTE — Progress Notes (Signed)
Subjective: 3 Days Post-Op Procedure(s) (LRB): REMOVAL EXTERNAL FIXATION PELVIS (Bilateral) OPEN REDUCTION INTERNAL FIXATION (ORIF) PELVIC FRACTURE (N/A) SACRO-ILIAC PINNING (Bilateral) Patient reports pain as 5 on 0-10 scale.    Objective: Vital signs in last 24 hours: Temp:  [98.1 F (36.7 C)-99.1 F (37.3 C)] 98.9 F (37.2 C) (12/22 1529) Pulse Rate:  [76-102] 96 (12/22 1529) Resp:  [14-22] 18 (12/22 1000) BP: (122-156)/(63-102) 143/85 (12/22 1529) SpO2:  [89 %-100 %] 93 % (12/22 1529)  Intake/Output from previous day: 12/21 0701 - 12/22 0700 In: 257.6 [P.O.:120; I.V.:37.6; IV Piggyback:100.1] Out: 1685 [Urine:1675; Drains:10] Intake/Output this shift: Total I/O In: -  Out: 460 [Urine:450; Drains:10]  Recent Labs    08/03/18 0413 08/05/18 0453  HGB 11.2* 10.1*   Recent Labs    08/03/18 0413 08/05/18 0453  WBC 10.6* 11.2*  RBC 3.58* 3.28*  HCT 33.5* 30.6*  PLT 139* 230   Recent Labs    08/03/18 0413 08/05/18 0453  NA 138 138  K 3.8 3.6  CL 105 101  CO2 25 28  BUN 9 10  CREATININE 0.49 0.54  GLUCOSE 118* 101*  CALCIUM 7.5* 8.2*   No results for input(s): LABPT, INR in the last 72 hours.  ABD soft Neurovascular intact Sensation intact distally Intact pulses distally Dorsiflexion/Plantar flexion intact Incision: scant drainage   Assessment/Plan: 3 Days Post-Op Procedure(s) (LRB): REMOVAL EXTERNAL FIXATION PELVIS (Bilateral) OPEN REDUCTION INTERNAL FIXATION (ORIF) PELVIC FRACTURE (N/A) SACRO-ILIAC PINNING (Bilateral) Advance diet  Patient is nwb throught bilateral lower extremities    She tolerated sitting on side of bed yesterday with PT and turning for dressing changes today   Percutaneous wounds posterior right and left are dry with no drainage    Still has some drainage from suprapubic wound on the left.  Continuing to improve    WalgreenKirstin J Jaishon Krisher 08/05/2018, 4:34 PM
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
## 2018-08-05 NOTE — Progress Notes (Signed)
Patient ID: Brennan BaileyStephanie Melendez, female   DOB: 13-Feb-1981, 37 y.o.   MRN: [id number] Vital signs are stable Uncomfortable from pelvic fracture Moving extremities well Otherwise stable

## 2018-08-06 ENCOUNTER — Encounter (HOSPITAL_COMMUNITY): Payer: Self-pay | Admitting: Physical Medicine and Rehabilitation

## 2018-08-06 DIAGNOSIS — G939 Disorder of brain, unspecified: Secondary | ICD-10-CM

## 2018-08-06 DIAGNOSIS — R1310 Dysphagia, unspecified: Secondary | ICD-10-CM

## 2018-08-06 DIAGNOSIS — S3729XS Other injury of bladder, sequela: Secondary | ICD-10-CM

## 2018-08-06 DIAGNOSIS — I609 Nontraumatic subarachnoid hemorrhage, unspecified: Secondary | ICD-10-CM

## 2018-08-06 DIAGNOSIS — S32592B Other specified fracture of left pubis, initial encounter for open fracture: Secondary | ICD-10-CM

## 2018-08-06 DIAGNOSIS — Z720 Tobacco use: Secondary | ICD-10-CM

## 2018-08-06 DIAGNOSIS — I10 Essential (primary) hypertension: Secondary | ICD-10-CM

## 2018-08-06 DIAGNOSIS — S332XXA Dislocation of sacroiliac and sacrococcygeal joint, initial encounter: Secondary | ICD-10-CM

## 2018-08-06 DIAGNOSIS — S3729XA Other injury of bladder, initial encounter: Secondary | ICD-10-CM

## 2018-08-06 DIAGNOSIS — S066X9A Traumatic subarachnoid hemorrhage with loss of consciousness of unspecified duration, initial encounter: Secondary | ICD-10-CM

## 2018-08-06 DIAGNOSIS — S32431A Displaced fracture of anterior column [iliopubic] of right acetabulum, initial encounter for closed fracture: Secondary | ICD-10-CM

## 2018-08-06 DIAGNOSIS — D72829 Elevated white blood cell count, unspecified: Secondary | ICD-10-CM

## 2018-08-06 DIAGNOSIS — S332XXS Dislocation of sacroiliac and sacrococcygeal joint, sequela: Secondary | ICD-10-CM

## 2018-08-06 MED ORDER — ENSURE ENLIVE PO LIQD
237.0000 mL | Freq: Three times a day (TID) | ORAL | Status: DC
  Administered 2018-08-06 – 2018-08-10 (×8): 237 mL via ORAL

## 2018-08-06 MED ORDER — SENNOSIDES-DOCUSATE SODIUM 8.6-50 MG PO TABS
1.0000 | ORAL_TABLET | Freq: Two times a day (BID) | ORAL | Status: DC
  Administered 2018-08-06 – 2018-08-10 (×9): 1 via ORAL
  Filled 2018-08-06 (×9): qty 1

## 2018-08-06 MED ORDER — POLYETHYLENE GLYCOL 3350 17 GM/SCOOP PO POWD: 1.0000 | Freq: Three times a day (TID) | ORAL | Status: DC

## 2018-08-06 MED ORDER — POLYETHYLENE GLYCOL 3350 17 G PO PACK
17.0000 g | PACK | Freq: Three times a day (TID) | ORAL | Status: AC
  Administered 2018-08-06 – 2018-08-07 (×4): 17 g via ORAL
  Filled 2018-08-06 (×4): qty 1

## 2018-08-06 NOTE — Progress Notes (Signed)
.  Inpatient Rehabilitation Admissions Coordinator  Patient progressing well with therapy. Both PT and OT recommending home health at wheelchair level with family support. Dr. Eliane DecreePatel's anticipated she will continue to make functional gains and not required a CIR admit.  Ottie GlazierBarbara Cheyenna Pankowski, RN, MSN Rehab Admissions Coordinator [phone] 08/06/2018 4:40 PM'
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
## 2018-08-06 NOTE — Plan of Care (Signed)
  Problem: Coping: Goal: Level of anxiety will decrease Outcome: Progressing   Problem: Elimination: Goal: Will not experience complications related to bowel motility Outcome: Progressing Goal: Will not experience complications related to urinary retention Outcome: Progressing   Problem: Pain Managment: Goal: General experience of comfort will improve Outcome: Progressing   Problem: Safety: Goal: Ability to remain free from injury will improve Outcome: Progressing   Problem: Skin Integrity: Goal: Risk for impaired skin integrity will decrease Outcome: Progressing   Problem: Skin Integrity: Goal: Demonstration of wound healing without infection will improve Outcome: Progressing
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
## 2018-08-06 NOTE — Progress Notes (Signed)
  Patient suffers from open book pelvic fracture which impairs their ability to perform daily activities like bathing, dressing, grooming and toileting in the home.  A cane, crutch or walker will not resolve  issue with performing activities of daily living. A wheelchair will allow patient to safely perform daily activities. Patient can safely propel the wheelchair in the home or has a caregiver who can provide assistance.  Accessories: elevating leg rests (ELRs), wheel locks, extensions and anti-tippers.  Olivia FortsBrooke A Melendez, [facility] 08/06/2018, 2:51 PM Pager: [phone] Mon 7:00 am -11:30 AM Tues-Fri 7:00 am-4:30 pm Sat-Sun 7:00 am-11:30 am
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
## 2018-08-06 NOTE — Progress Notes (Signed)
Patient suffers from open book pelvic fracture which impairs their ability to perform daily activities like bathing, dressing, grooming and toileting in the home.  She requires the ability to reposition frequently in bed for which a hospital bed is needed.  Olivia Melendez, [facility] 08/06/2018, 4:23 PM Pager: [phone] Mon 7:00 am -11:30 AM Tues-Fri 7:00 am-4:30 pm Sat-Sun 7:00 am-11:30 am

## 2018-08-06 NOTE — Plan of Care (Signed)
  Problem: Education: Goal: Knowledge of General Education information will improve Description Including pain rating scale, medication(s)/side effects and non-pharmacologic comfort measures Outcome: Progressing   Problem: Clinical Measurements: Goal: Ability to maintain clinical measurements within normal limits will improve Outcome: Progressing   Problem: Activity: Goal: Risk for activity intolerance will decrease Outcome: Progressing   Problem: Elimination: Goal: Will not experience complications related to bowel motility Outcome: Progressing   Problem: Pain Managment: Goal: General experience of comfort will improve Outcome: Progressing   Problem: Safety: Goal: Ability to remain free from injury will improve Outcome: Progressing   
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
## 2018-08-07 MED ORDER — BISACODYL 10 MG RE SUPP: 10.0000 mg | Freq: Every day | RECTAL | Status: DC | PRN

## 2018-08-07 MED ORDER — TRAMADOL HCL 50 MG PO TABS
50.0000 mg | ORAL_TABLET | Freq: Four times a day (QID) | ORAL | Status: DC | PRN
  Administered 2018-08-07 – 2018-08-08 (×3): 50 mg via ORAL
  Filled 2018-08-07 (×3): qty 1

## 2018-08-07 MED ORDER — OXYCODONE HCL 5 MG PO TABS
5.0000 mg | ORAL_TABLET | ORAL | Status: DC | PRN
  Administered 2018-08-07: 5 mg via ORAL
  Administered 2018-08-07 – 2018-08-08 (×2): 10 mg via ORAL
  Administered 2018-08-08 (×2): 5 mg via ORAL
  Administered 2018-08-08 – 2018-08-10 (×5): 10 mg via ORAL
  Administered 2018-08-10: 5 mg via ORAL
  Filled 2018-08-07 (×2): qty 2
  Filled 2018-08-07: qty 1
  Filled 2018-08-07 (×4): qty 2
  Filled 2018-08-07 (×3): qty 1
  Filled 2018-08-07: qty 2
  Filled 2018-08-07: qty 1

## 2018-08-07 MED ORDER — HYDROMORPHONE HCL 1 MG/ML IJ SOLN
0.5000 mg | INTRAMUSCULAR | Status: DC | PRN
  Administered 2018-08-07: 0.5 mg via INTRAVENOUS
  Filled 2018-08-07: qty 1

## 2018-08-07 MED ORDER — IBUPROFEN 200 MG PO TABS
600.0000 mg | ORAL_TABLET | Freq: Four times a day (QID) | ORAL | Status: DC | PRN
  Administered 2018-08-07: 600 mg via ORAL
  Filled 2018-08-07: qty 3
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
## 2018-08-07 NOTE — Plan of Care (Signed)
  Problem: Pain Managment: Goal: General experience of comfort will improve Outcome: Progressing   Problem: Safety: Goal: Ability to remain free from injury will improve Outcome: Progressing   Problem: Skin Integrity: Goal: Risk for impaired skin integrity will decrease Outcome: Progressing   Problem: Skin Integrity: Goal: Demonstration of wound healing without infection will improve Outcome: Progressing
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
## 2018-08-07 NOTE — Progress Notes (Signed)
Subjective/Chief Complaint:  1 - Large Extraperitoneal Bladder Rupture - left lateral posterior bladder rupture found at exploration / cystotomy repair [facility]. UO's patent and trigone / bladder neck UNinvolved. JP and 47F foley placed. JP removed 12/24 as output scant (<4120mL/day)  Today "Olivia Melendez" is continuing to make progress. JP output scant, No problems from foley. Doing well with seated transfers. Dry per vagina.    Objective: Vital signs in last 24 hours: Temp:  [98.2 F (36.8 C)-99.3 F (37.4 C)] 98.2 F (36.8 C) (12/24 0501) Pulse Rate:  [80-103] 80 (12/24 0501) Resp:  [14-18] 14 (12/24 0501) BP: (128-160)/(76-88) 133/76 (12/24 0501) SpO2:  [97 %-99 %] 99 % (12/24 0501) Weight:  [81 kg] 81 kg (12/24 0500) Last BM Date: 08/07/18  Intake/Output from previous day: 12/23 0701 - 12/24 0700 In: 848 [P.O.:840] Out: 2835 [Urine:2825; Drains:10] Intake/Output this shift: Total I/O In: 800 [P.O.:800] Out: -   General appearance: alert, cooperative, appears stated age and family at bedside Eyes: negative Nose: Nares normal. Septum midline. Mucosa normal. No drainage or sinus tenderness. Throat: lips, mucosa, and tongue normal; teeth and gums normal Neck: supple, symmetrical, trachea midline Back: symmetric, no curvature. ROM normal. No CVA tenderness. Resp: non-labored on room air.  Cardio: Nl rate Pelvic: external genitalia normal, vagina normal without discharge and foley in palce wtih non-foul yellow urine.  Extremities: extremities normal, atraumatic, no cyanosis or edema Lymph nodes: Cervical, supraclavicular, and axillary nodes normal. Neurologic: Grossly normal Incision/Wound: recent pfannenstiel incision c/d/i. JP removed and dressing replaced. All JP anchoring non-absorbable suture removed.   Lab Results:  Recent Labs    08/05/18 0453  WBC 11.2*  HGB 10.1*  HCT 30.6*  PLT 230   BMET Recent Labs    08/05/18 0453   NA 138  K 3.6  CL 101  CO2 28  GLUCOSE 101*  BUN 10  CREATININE 0.54  CALCIUM 8.2*   PT/INR No results for input(s): LABPROT, INR in the last 72 hours. ABG No results for input(s): PHART, HCO3 in the last 72 hours.  Invalid input(s): PCO2, PO2  Studies/Results: No results found.  Anti-infectives: Anti-infectives (From admission, onward)   Start     Dose/Rate Route Frequency Ordered Stop   08/02/18 2000  cefTRIAXone (ROCEPHIN) 2 g in sodium chloride 0.9 % 100 mL IVPB     2 g 200 mL/hr over 30 Minutes Intravenous Every 24 hours 08/02/18 1850 08/04/18 2002   08/02/18 1419  tobramycin (NEBCIN) powder  Status:  Discontinued       As needed 08/02/18 1419 08/02/18 1511   08/02/18 1418  vancomycin (VANCOCIN) powder  Status:  Discontinued       As needed 08/02/18 1418 08/02/18 1511   07/31/18 0100  cefTRIAXone (ROCEPHIN) 2 g in sodium chloride 0.9 % 100 mL IVPB     2 g 200 mL/hr over 30 Minutes Intravenous Every 24 hours 07/30/18 0906 08/02/18 0200   07/30/18 1458  vancomycin (VANCOCIN) powder  Status:  Discontinued       As needed 07/30/18 1458 07/30/18 1629   07/30/18 1457  tobramycin (NEBCIN) powder  Status:  Discontinued       As needed 07/30/18 1457 07/30/18 1629   07/30/18 1000  metroNIDAZOLE (FLAGYL) IVPB 500 mg     500 mg 100 mL/hr over 60 Minutes Intravenous Every 8 hours 07/30/18 0930 08/02/18 0330   07/30/18 0015  ceFAZolin (ANCEF) IVPB 2g/100 mL premix  Status:  Discontinued  2 g 200 mL/hr over 30 Minutes Intravenous Every 8 hours 07/30/18 0013 07/30/18 0906   07/29/18 2345  cefTRIAXone (ROCEPHIN) 2 g in sodium chloride 0.9 % 100 mL IVPB     2 g 200 mL/hr over 30 Minutes Intravenous  Once 07/29/18 2336 07/30/18 0152   07/29/18 2345  cefTRIAXone (ROCEPHIN) 1 g in sodium chloride 0.9 % 100 mL IVPB  Status:  Discontinued     1 g 200 mL/hr over 30 Minutes Intravenous Every 24 hours 07/29/18 2336 07/29/18 2338      Assessment/Plan:  1 - Large Extraperitoneal  Bladder Rupture - Doing well clinically. JP removed today. Consider cystogram as early as THursday if still in house. Will need negative cystogram (no leaks) before foley removal / trial of void.   Please call me directly with questions anytime.   Olivia Melendez 08/07/2018

## 2018-08-07 NOTE — Care Management (Signed)
Per Shon Milletan Phillips, [facility], he quoted patient and family cost  [facility], $148/month for bed rental,  wheelchair and slide have monthly cost as well. Patient will likely need shortterm Rehab at SNF, mom not able to provide assistance for care that will be required.
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
## 2018-08-08 LAB — COMPREHENSIVE METABOLIC PANEL
ALT: 38 U/L (ref 0–44)
ANION GAP: 10 (ref 5–15)
AST: 40 U/L (ref 15–41)
Albumin: 2.5 g/dL — ABNORMAL LOW (ref 3.5–5.0)
Alkaline Phosphatase: 78 U/L (ref 38–126)
BUN: 10 mg/dL (ref 6–20)
CO2: 26 mmol/L (ref 22–32)
Calcium: 8.2 mg/dL — ABNORMAL LOW (ref 8.9–10.3)
Chloride: 102 mmol/L (ref 98–111)
Creatinine, Ser: 0.49 mg/dL (ref 0.44–1.00)
GFR calc Af Amer: >60 mL/min (ref >60)
GFR calc non Af Amer: >60 mL/min (ref >60)
Glucose, Bld: 109 mg/dL — ABNORMAL HIGH (ref 70–99)
Potassium: 3.5 mmol/L (ref 3.5–5.1)
Sodium: 138 mmol/L (ref 135–145)
Total Bilirubin: 0.9 mg/dL (ref 0.3–1.2)
Total Protein: 6.1 g/dL — ABNORMAL LOW (ref 6.5–8.1)

## 2018-08-08 LAB — CBC
HCT: 30.3 % — ABNORMAL LOW (ref 36.0–46.0)
Hemoglobin: 10 g/dL — ABNORMAL LOW (ref 12.0–15.0)
MCH: 31 pg (ref 26.0–34.0)
MCHC: 33 g/dL (ref 30.0–36.0)
MCV: 93.8 fL (ref 80.0–100.0)
Platelets: 445 10*3/uL — ABNORMAL HIGH (ref 150–400)
RBC: 3.23 MIL/uL — ABNORMAL LOW (ref 3.87–5.11)
RDW: 14 % (ref 11.5–15.5)
WBC: 15.7 10*3/uL — ABNORMAL HIGH (ref 4.0–10.5)
nRBC: 0.1 % (ref 0.0–0.2)

## 2018-08-08 MED ORDER — IBUPROFEN 200 MG PO TABS
600.0000 mg | ORAL_TABLET | Freq: Three times a day (TID) | ORAL | Status: DC
  Administered 2018-08-08 – 2018-08-10 (×7): 600 mg via ORAL
  Filled 2018-08-08 (×7): qty 3

## 2018-08-08 MED ORDER — ASPIRIN-ACETAMINOPHEN-CAFFEINE 250-250-65 MG PO TABS: 1.0000 | ORAL_TABLET | Freq: Three times a day (TID) | ORAL | Status: DC | PRN

## 2018-08-08 MED ORDER — SUMATRIPTAN SUCCINATE 50 MG PO TABS
50.0000 mg | ORAL_TABLET | Freq: Once | ORAL | Status: AC
  Administered 2018-08-08: 50 mg via ORAL
  Filled 2018-08-08: qty 1

## 2018-08-08 MED ORDER — POTASSIUM CHLORIDE CRYS ER 20 MEQ PO TBCR
20.0000 meq | EXTENDED_RELEASE_TABLET | Freq: Every day | ORAL | Status: DC
  Administered 2018-08-08 – 2018-08-10 (×3): 20 meq via ORAL
  Filled 2018-08-08 (×3): qty 1

## 2018-08-08 MED ORDER — PSYLLIUM 95 % PO PACK
1.0000 | PACK | Freq: Every day | ORAL | Status: DC
  Administered 2018-08-08 – 2018-08-09 (×2): 1 via ORAL
  Filled 2018-08-08 (×3): qty 1

## 2018-08-08 NOTE — Plan of Care (Signed)
  Problem: Pain Managment: Goal: General experience of comfort will improve Outcome: Progressing   Problem: Safety: Goal: Ability to remain free from injury will improve Outcome: Progressing   

## 2018-08-08 NOTE — Progress Notes (Signed)
Pt states she woke up thinking she was in a hotel and thinking that she could get up and walk. Pt reoriented. Pt denies any other symptoms, hallucinations. Will continue to monitor pt.
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
## 2018-08-08 NOTE — Progress Notes (Signed)
Air mattress (Versa care bed) provided and transferred pt to the new bed.  Pt is comfortable.

## 2018-08-08 NOTE — Plan of Care (Signed)
  Problem: Education: Goal: Knowledge of General Education information will improve Description Including pain rating scale, medication(s)/side effects and non-pharmacologic comfort measures Outcome: Progressing   Problem: Health Behavior/Discharge Planning: Goal: Ability to manage health-related needs will improve Outcome: Progressing   Problem: Clinical Measurements: Goal: Will remain free from infection Outcome: Progressing Goal: Cardiovascular complication will be avoided Outcome: Progressing   Problem: Activity: Goal: Risk for activity intolerance will decrease Outcome: Progressing   Problem: Coping: Goal: Level of anxiety will decrease Outcome: Progressing   Problem: Pain Managment: Goal: General experience of comfort will improve Outcome: Progressing

## 2018-08-09 MED ORDER — PSYLLIUM 95 % PO PACK: 1.0000 | PACK | Freq: Every day | ORAL | Status: DC

## 2018-08-09 MED ORDER — SENNOSIDES-DOCUSATE SODIUM 8.6-50 MG PO TABS: 1.0000 | ORAL_TABLET | Freq: Two times a day (BID) | ORAL | Status: DC

## 2018-08-09 MED ORDER — METHOCARBAMOL 500 MG PO TABS: 500.0000 mg | ORAL_TABLET | Freq: Three times a day (TID) | ORAL | 0 refills | Status: DC | PRN

## 2018-08-09 MED ORDER — ACETAMINOPHEN 500 MG PO TABS: 1000.0000 mg | ORAL_TABLET | Freq: Three times a day (TID) | ORAL | 0 refills | Status: DC | PRN

## 2018-08-09 MED ORDER — OXYCODONE HCL 5 MG PO TABS: 5.0000 mg | ORAL_TABLET | ORAL | 0 refills | Status: DC | PRN

## 2018-08-09 MED ORDER — ADULT MULTIVITAMIN W/MINERALS CH: 1.0000 | ORAL_TABLET | Freq: Every day | ORAL | Status: DC

## 2018-08-09 MED ORDER — ENOXAPARIN SODIUM 40 MG/0.4ML ~~LOC~~ SOLN: 40.0000 mg | SUBCUTANEOUS | 0 refills | Status: DC

## 2018-08-09 MED ORDER — IBUPROFEN 600 MG PO TABS: 600.0000 mg | ORAL_TABLET | Freq: Three times a day (TID) | ORAL | 0 refills | Status: DC | PRN

## 2018-08-09 MED FILL — IBUPROFEN 600 MG TABLET: 600 | 10 days supply | Qty: 30 | Fill #0

## 2018-08-09 MED FILL — oxyCODONE HCL 5 MG TABS: 5 | 3 days supply | Qty: 30 | Fill #0

## 2018-08-09 MED FILL — ENOXAPARIN 40 MG/0.4 ML SYR: 40 | 21 days supply | Qty: 8 | Fill #0

## 2018-08-09 MED FILL — ACETAMINOPHEN EXTRA STRENGT: 500 | 5 days supply | Qty: 30 | Fill #0

## 2018-08-09 MED FILL — METHOCARBAMOL 500 MG TABLET: 500 | 10 days supply | Qty: 30 | Fill #0
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
## 2018-08-09 NOTE — Plan of Care (Signed)
  Problem: Pain Managment: Goal: General experience of comfort will improve Outcome: Progressing   Problem: Safety: Goal: Ability to remain free from injury will improve Outcome: Progressing   
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
## 2018-08-10 ENCOUNTER — Inpatient Hospital Stay (HOSPITAL_COMMUNITY): Payer: Medicaid Other

## 2018-08-10 LAB — CBC
HCT: 37.6 % (ref 36.0–46.0)
Hemoglobin: 12.2 g/dL (ref 12.0–15.0)
MCH: 30.4 pg (ref 26.0–34.0)
MCHC: 32.4 g/dL (ref 30.0–36.0)
MCV: 93.8 fL (ref 80.0–100.0)
Platelets: 646 10*3/uL — ABNORMAL HIGH (ref 150–400)
RBC: 4.01 MIL/uL (ref 3.87–5.11)
RDW: 13.8 % (ref 11.5–15.5)
WBC: 14.7 10*3/uL — ABNORMAL HIGH (ref 4.0–10.5)
nRBC: 0 % (ref 0.0–0.2)

## 2018-08-10 LAB — BASIC METABOLIC PANEL
Anion gap: 14 (ref 5–15)
BUN: 13 mg/dL (ref 6–20)
CO2: 22 mmol/L (ref 22–32)
Calcium: 9.1 mg/dL (ref 8.9–10.3)
Chloride: 101 mmol/L (ref 98–111)
Creatinine, Ser: 0.55 mg/dL (ref 0.44–1.00)
GFR calc Af Amer: >60 mL/min (ref >60)
GFR calc non Af Amer: >60 mL/min (ref >60)
Glucose, Bld: 96 mg/dL (ref 70–99)
Potassium: 4.6 mmol/L (ref 3.5–5.1)
Sodium: 137 mmol/L (ref 135–145)

## 2018-08-10 MED ORDER — IOTHALAMATE MEGLUMINE 17.2 % UR SOLN
250.0000 mL | Freq: Once | URETHRAL | Status: AC | PRN
  Administered 2018-08-10: 250 mL via INTRAVESICAL
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
## 2018-08-10 NOTE — Progress Notes (Signed)
Brennan BaileyStephanie Kray to be D/C'd home with home health per MD order.  Discussed prescriptions and follow up appointments with the patient. Prescriptions given to patient, medication list explained in detail. Pt verbalized understanding.  Allergies as of 08/10/2018   No Known Allergies     Medication List    TAKE these medications   acetaminophen 500 MG tablet Commonly known as:  TYLENOL Take 2 tablets (1,000 mg total) by mouth every 8 (eight) hours as needed.   enoxaparin 40 MG/0.4ML injection Commonly known as:  LOVENOX Inject 0.4 mLs (40 mg total) into the skin daily for 21 days.   ibuprofen 600 MG tablet Commonly known as:  ADVIL,MOTRIN Take 1 tablet (600 mg total) by mouth every 8 (eight) hours as needed.   lisinopril 20 MG tablet Commonly known as:  PRINIVIL,ZESTRIL Take 20 mg by mouth daily.   methocarbamol 500 MG tablet Commonly known as:  ROBAXIN Take 1 tablet (500 mg total) by mouth every 8 (eight) hours as needed for muscle spasms.   multivitamin with minerals Tabs tablet Place 1 tablet into feeding tube daily.   oxyCODONE 5 MG immediate release tablet Commonly known as:  Oxy IR/ROXICODONE Take 1-2 tablets (5-10 mg total) by mouth every 4 (four) hours as needed for severe pain.   psyllium 95 % Pack Commonly known as:  HYDROCIL/METAMUCIL Take 1 packet by mouth daily.   senna-docusate 8.6-50 MG tablet Commonly known as:  Senokot-S Take 1 tablet by mouth 2 (two) times daily.            Durable Medical Equipment  (From admission, onward)         Start     Ordered   08/08/18 1159  For home use only DME Air overlay mattress  Once     08/08/18 1159   08/08/18 1159  For home use only DME Hospital bed  Once    Question Answer Comment  Patient has (list medical condition): pelvic ring fracture   The above medical condition requires: Patient requires the ability to reposition frequently   Bed type Semi-electric   Trapeze Bar Yes   Support Surface: Low Air loss  Mattress      08/08/18 1159   08/06/18 1451  For home use only DME Tub bench  Once     08/06/18 1450   08/06/18 1449  For home use only DME Other see comment  Once    Comments:  Sliding board   08/06/18 1450   08/06/18 1449  For home use only DME standard manual wheelchair with seat cushion  Once    Comments:  Patient suffers from open book pelvic fracture which impairs their ability to perform daily activities like bathing, dressing, grooming and toileting in the home.  A cane, crutch or walker will not resolve  issue with performing activities of daily living. A wheelchair will allow patient to safely perform daily activities. Patient can safely propel the wheelchair in the home or has a caregiver who can provide assistance.  Accessories: elevating leg rests (ELRs), wheel locks, extensions and anti-tippers.   08/06/18 1450   08/06/18 1448  For home use only DME 3 n 1  Once     08/06/18 1450          Vitals:   08/09/18 2034 08/10/18 0500  BP: 125/78 120/69  Pulse: 83 86  Resp: 18   Temp: 98.2 F (36.8 C) 98.1 F (36.7 C)  SpO2: 100% 100%    Skin clean, dry  and intact without evidence of skin break down, no evidence of skin tears noted. IV catheter discontinued intact. Site without signs and symptoms of complications. Dressing and pressure applied. Pt denies pain at this time. No complaints noted.  An After Visit Summary was printed and given to the patient. Patient escorted via WC, and D/C home via private auto.  Wendie Simmeraylisa H. RN [employer]

## 2018-08-10 NOTE — Plan of Care (Signed)

## 2018-08-10 NOTE — Discharge Instructions (Signed)
Pain Medicine Instructions You may need pain medicine after an injury or illness. Two common types of pain medicine are:  Opioid pain medicine. These may be called opioids.  Non-opioid pain medicine. This includes NSAIDs. It is important to follow your doctor's instructions when you are taking pain medicine. Doing this can keep yourself and others safe. How can pain medicine affect me? Pain medicine may not make all of your pain go away. It should make you comfortable enough to:  Move.  Breathe.  Do normal activities. Opioids can cause side effects, such as:  Trouble pooping (constipation).  Feeling sick to your stomach (nausea).  Throwing up (vomiting).  Feeling very sleepy.  Confusion.  Taking the medicine for nonmedical reasons even though taking it hurts your health and well-being (opioid use disorder).  Trouble breathing (respiratory depression). Taking opioids for longer than 3 days raises your risk of these side effects. Taking opioids for a long time can affect how well you can do daily tasks. Taking them for a long time also puts you at risk for:  Car crashes.  Depression.  Suicide.  Heart attack.  Taking too much of the medicine (overdose). This can lead to death. What should I do to stay safe while taking pain medicine? Take your medicine as told  Take pain medicine exactly as told by your doctor. Take it only when you need it.  Write down the times when you take your pain medicine. Look at the times before you take your next dose.  Take other over-the-counter or prescription medicines only as told by your doctor. ? If your pain medicine has acetaminophen in it, do not take any other acetaminophen while you are taking this medicine. Too much can damage the liver.  Get pain medicine prescriptions from only one doctor. Avoid certain activities While you are taking prescription pain medicine, and for 8 hours after your last dose:  Do not drive.  Do  not use machinery.  Do not use power tools.  Do not sign legal documents.  Do not drink alcohol.  Do not take sleeping pills.  Do not take care of children by yourself.  Do not do any activities that involve climbing or being in high places.  Do not go into any body of water unless there is an adult nearby who can watch you and help you if needed. This includes: ? [place]. ? Rivers. ? Oceans. ? Spas. ? Swimming pools.  Keep others safe  Store your medicine as told by your doctor. Keep it where children and pets cannot reach it.  Do not share your pain medicine with anyone.  Do not save any leftover pills. If you have leftover pills, you can: ? Bring them to a take-back program. ? Bring them to a pharmacy that has a drug disposal container. ? Throw them in the trash. Check the medicine label or package insert to see if it is safe to throw it out. If it is safe, take the medicine out of the container. Mix it with something that makes it unusable, such as pet waste. Then put the medicine in the trash. General instructions  Talk with your doctor about other ways to manage your pain.  If you have trouble pooping: ? Drink enough fluid to keep your pee (urine) pale yellow. ? Use a poop (stool) softener as told by your doctor. ? Eat more fruits and vegetables.  Keep all follow-up visits as told by your doctor. This is important. Contact a  doctor about other ways to manage your pain.  · If you have trouble pooping:  ? Drink enough fluid to keep your pee (urine) pale yellow.  ? Use a poop (stool) softener as told by your doctor.  ? Eat more fruits and vegetables.  · Keep all follow-up visits as told by your doctor. This is important.  Contact a doctor if:  · Your medicine is not helping with your pain.  · You have a rash.  · You feel depressed.  Get help right away if:  Seek medical care right away if you are taking pain medicines and you (or people close to you) notice any of the following:  · Trouble breathing.  · Breathing that is shorter than normal.  · Breathing that is more shallow than normal.  · Confusion.  · Sleepiness.  · Trouble staying awake.  · Feeling sick to your stomach.  · Throwing up.  · Your skin or lips turning pale or bluish in color.  · Tongue swelling.  If you ever  feel like you may hurt yourself or others, or have thoughts about taking your own life, get help right away. Go to your nearest emergency department or call:  · Your local emergency services (911 in the [place]  · A suicide crisis helpline, such as the National Suicide Prevention Lifeline at 1-800-273-8255. This is open 24 hours a day.  Summary  · Take your pain medicine exactly as told by your doctor.  · Pain medicine can help lower your pain. It may also cause side effects.  · Talk with your doctor about other ways to manage your pain.  · Follow your doctor's instructions about how to take your pain medicine and keep others safe. Ask what activities you should avoid while taking pain medicine.  This information is not intended to replace advice given to you by your health care provider. Make sure you discuss any questions you have with your health care provider.  Document Released: 01/18/2008 Document Revised: 03/13/2017 Document Reviewed: 03/13/2017  Elsevier Interactive Patient Education © 2019 [employer]

## 2018-08-10 NOTE — Progress Notes (Signed)
[facility] RTC and spoke to pt's mother and answered her questions.

## 2018-08-10 NOTE — Progress Notes (Signed)
LVM with [facility] trauma C. Pt and mother needs to speak with her regarding HH. Will await RTC from [facility]. Pt and daughter are aware.
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
## 2018-08-13 ENCOUNTER — Encounter (HOSPITAL_BASED_OUTPATIENT_CLINIC_OR_DEPARTMENT_OTHER): Payer: Self-pay | Admitting: Obstetrics and Gynecology

## 2018-08-29 ENCOUNTER — Encounter: Payer: 59 | Admitting: Family Medicine

## 2018-08-29 ENCOUNTER — Encounter: Payer: 59 | Admitting: Obstetrics and Gynecology

## 2019-03-29 ENCOUNTER — Ambulatory Visit: Payer: Self-pay | Admitting: Student

## 2019-03-29 DIAGNOSIS — S329XXB Fracture of unspecified parts of lumbosacral spine and pelvis, initial encounter for open fracture: Secondary | ICD-10-CM

## 2019-04-09 ENCOUNTER — Other Ambulatory Visit (HOSPITAL_COMMUNITY)
Admission: RE | Admit: 2019-04-09 | Discharge: 2019-04-09 | Disposition: A | Discharge status: Home / Self Care | Payer: 59 | Source: Ambulatory Visit | Attending: Student | Admitting: Student

## 2019-04-09 DIAGNOSIS — Z20828 Contact with and (suspected) exposure to other viral communicable diseases: Secondary | ICD-10-CM | POA: Insufficient documentation

## 2019-04-09 DIAGNOSIS — Z01812 Encounter for preprocedural laboratory examination: Secondary | ICD-10-CM | POA: Diagnosis not present

## 2019-04-09 LAB — SARS CORONAVIRUS 2 (TAT 6-24 HRS): SARS Coronavirus 2: NEGATIVE
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
## 2019-04-10 ENCOUNTER — Encounter (HOSPITAL_COMMUNITY): Payer: Self-pay

## 2019-04-10 ENCOUNTER — Other Ambulatory Visit: Payer: Self-pay

## 2019-04-10 NOTE — Progress Notes (Signed)
PCP - Dr. Estrella Deeds  Cardiologist - Denies  Chest x-ray - 08/03/18 (E)  EKG - DOS  Stress Test - Denies  ECHO - Denies  Cardiac Cath - Denies  AICD-na PM-na LOOP-na  Sleep Study - Denies CPAP - None  LABS- 04/12/2019: CBC, BMP, POC UPreg 04/09/2019: COVID- Neg  ASA- Denies  ERAS- Yes   Anesthesia- No  Pt denies having chest pain, sob, or fever during the pre-op phone call. All instructions explained to the pt, with a verbal understanding including: as of today, stop taking all Aspirin (unless instructed by your doctor) and Other Aspirin containing products, Vitamins, Fish oils, and Herbal medications. Also stop all NSAIDS i.e. Advil, Ibuprofen, Motrin, Aleve, Anaprox, Naproxen, BC, Goody Powders, and all Supplements. Pt also instructed to self quarantine after being tested for COVID-19. The opportunity to ask questions was provided.   Coronavirus Screening  Have you experienced the following symptoms:  Cough yes/no: No Fever (>100.21F)  yes/no: No Runny nose yes/no: No Sore throat yes/no: No Difficulty breathing/shortness of breath  yes/no: No  Have you or a family member traveled in the last 14 days and where? yes/no: No   If the patient indicates "YES" to the above questions, their PAT will be rescheduled to limit the exposure to others and, the surgeon will be notified. THE PATIENT WILL NEED TO BE ASYMPTOMATIC FOR 14 DAYS.   If the patient is not experiencing any of these symptoms, the PAT nurse will instruct them to NOT bring anyone with them to their appointment since they may have these symptoms or traveled as well.   Please remind your patients and families that hospital visitation restrictions are in effect and the importance of the restrictions.

## 2019-04-11 NOTE — Anesthesia Preprocedure Evaluation (Addendum)
Anesthesia Evaluation  Patient identified by MRN, date of birth, ID band Patient awake    Reviewed: Allergy & Precautions, NPO status , Patient's Chart, lab work & pertinent test results  History of Anesthesia Complications Negative for: history of anesthetic complications  Airway Mallampati: II  TM Distance: >3 FB Neck ROM: Full    Dental  (+) Missing,    Pulmonary Current Smoker and Patient abstained from smoking.,    Pulmonary exam normal        Cardiovascular hypertension, Normal cardiovascular exam     Neuro/Psych  Headaches, S/p supocciptal crainectomy and chiari decompression 2013  idiopathic intracranial hypertension    GI/Hepatic negative GI ROS, Neg liver ROS,   Endo/Other  negative endocrine ROS  Renal/GU negative Renal ROS     Musculoskeletal negative musculoskeletal ROS (+)   Abdominal   Peds  Hematology negative hematology ROS (+)   Anesthesia Other Findings Day of surgery medications reviewed with the patient.  Reproductive/Obstetrics                            Anesthesia Physical Anesthesia Plan  ASA: II  Anesthesia Plan: General   Post-op Pain Management:    Induction: Intravenous  PONV Risk Score and Plan: 4 or greater and Treatment may vary due to age or medical condition, Ondansetron, Dexamethasone and Midazolam  Airway Management Planned: Oral ETT  Additional Equipment:   Intra-op Plan:   Post-operative Plan: Extubation in OR  Informed Consent: I have reviewed the patients History and Physical, chart, labs and discussed the procedure including the risks, benefits and alternatives for the proposed anesthesia with the patient or authorized representative who has indicated his/her understanding and acceptance.     Dental advisory given  Plan Discussed with: CRNA  Anesthesia Plan Comments:        Anesthesia Quick Evaluation

## 2019-04-12 ENCOUNTER — Encounter (HOSPITAL_COMMUNITY): Payer: Self-pay | Admitting: Anesthesiology

## 2019-04-12 ENCOUNTER — Encounter (HOSPITAL_COMMUNITY)
Admission: RE | Disposition: A | Discharge status: Home / Self Care | Payer: Self-pay | Source: Home / Self Care | Attending: Student

## 2019-04-12 ENCOUNTER — Ambulatory Visit (HOSPITAL_COMMUNITY): Payer: Medicaid Other | Admitting: Anesthesiology

## 2019-04-12 ENCOUNTER — Ambulatory Visit (HOSPITAL_COMMUNITY): Payer: Medicaid Other

## 2019-04-12 ENCOUNTER — Ambulatory Visit (HOSPITAL_COMMUNITY)
Admission: RE | Admit: 2019-04-12 | Discharge: 2019-04-12 | Disposition: A | Discharge status: Home / Self Care | Payer: Medicaid Other | Attending: Student | Admitting: Student

## 2019-04-12 DIAGNOSIS — F17210 Nicotine dependence, cigarettes, uncomplicated: Secondary | ICD-10-CM | POA: Diagnosis not present

## 2019-04-12 DIAGNOSIS — I10 Essential (primary) hypertension: Secondary | ICD-10-CM | POA: Diagnosis not present

## 2019-04-12 DIAGNOSIS — Z419 Encounter for procedure for purposes other than remedying health state, unspecified: Secondary | ICD-10-CM

## 2019-04-12 DIAGNOSIS — S329XXA Fracture of unspecified parts of lumbosacral spine and pelvis, initial encounter for closed fracture: Secondary | ICD-10-CM

## 2019-04-12 DIAGNOSIS — Y792 Prosthetic and other implants, materials and accessory orthopedic devices associated with adverse incidents: Secondary | ICD-10-CM | POA: Diagnosis not present

## 2019-04-12 DIAGNOSIS — S329XXB Fracture of unspecified parts of lumbosacral spine and pelvis, initial encounter for open fracture: Secondary | ICD-10-CM | POA: Insufficient documentation

## 2019-04-12 DIAGNOSIS — T8484XA Pain due to internal orthopedic prosthetic devices, implants and grafts, initial encounter: Secondary | ICD-10-CM | POA: Insufficient documentation

## 2019-04-12 HISTORY — PX: HARDWARE REMOVAL: SHX979

## 2019-04-12 LAB — BASIC METABOLIC PANEL
Anion gap: 9 (ref 5–15)
BUN: 11 mg/dL (ref 6–20)
CO2: 24 mmol/L (ref 22–32)
Calcium: 9 mg/dL (ref 8.9–10.3)
Chloride: 106 mmol/L (ref 98–111)
Creatinine, Ser: 0.61 mg/dL (ref 0.44–1.00)
GFR calc Af Amer: >60 mL/min (ref >60)
GFR calc non Af Amer: >60 mL/min (ref >60)
Glucose, Bld: 81 mg/dL (ref 70–99)
Potassium: 4 mmol/L (ref 3.5–5.1)
Sodium: 139 mmol/L (ref 135–145)

## 2019-04-12 LAB — CBC
HCT: 42.5 % (ref 36.0–46.0)
Hemoglobin: 14.2 g/dL (ref 12.0–15.0)
MCH: 32.6 pg (ref 26.0–34.0)
MCHC: 33.4 g/dL (ref 30.0–36.0)
MCV: 97.5 fL (ref 80.0–100.0)
Platelets: 230 10*3/uL (ref 150–400)
RBC: 4.36 MIL/uL (ref 3.87–5.11)
RDW: 12 % (ref 11.5–15.5)
WBC: 8.5 10*3/uL (ref 4.0–10.5)
nRBC: 0 % (ref 0.0–0.2)

## 2019-04-12 LAB — POCT PREGNANCY, URINE: Preg Test, Ur: NEGATIVE

## 2019-04-12 SURGERY — REMOVAL, HARDWARE
Anesthesia: General | Laterality: Bilateral

## 2019-04-12 MED ORDER — PROPOFOL 10 MG/ML IV BOLUS
INTRAVENOUS | Status: AC
  Filled 2019-04-12: qty 40

## 2019-04-12 MED ORDER — 0.9 % SODIUM CHLORIDE (POUR BTL) OPTIME
TOPICAL | Status: DC | PRN
  Administered 2019-04-12: 1000 mL

## 2019-04-12 MED ORDER — SCOPOLAMINE 1 MG/3DAYS TD PT72
1.0000 | MEDICATED_PATCH | Freq: Once | TRANSDERMAL | Status: DC
  Filled 2019-04-12: qty 1

## 2019-04-12 MED ORDER — SUGAMMADEX SODIUM 200 MG/2ML IV SOLN
INTRAVENOUS | Status: DC | PRN
  Administered 2019-04-12: 150 mg via INTRAVENOUS

## 2019-04-12 MED ORDER — LACTATED RINGERS IV SOLN
INTRAVENOUS | Status: DC | PRN
  Administered 2019-04-12: 07:00:00 via INTRAVENOUS

## 2019-04-12 MED ORDER — ROCURONIUM BROMIDE 50 MG/5ML IV SOSY
PREFILLED_SYRINGE | INTRAVENOUS | Status: DC | PRN
  Administered 2019-04-12: 50 mg via INTRAVENOUS

## 2019-04-12 MED ORDER — FENTANYL CITRATE (PF) 100 MCG/2ML IJ SOLN
INTRAMUSCULAR | Status: DC | PRN
  Administered 2019-04-12: 100 ug via INTRAVENOUS
  Administered 2019-04-12 (×2): 50 ug via INTRAVENOUS

## 2019-04-12 MED ORDER — OXYCODONE HCL 5 MG PO TABS: 5.0000 mg | ORAL_TABLET | Freq: Four times a day (QID) | ORAL | 0 refills | Status: DC | PRN

## 2019-04-12 MED ORDER — FENTANYL CITRATE (PF) 100 MCG/2ML IJ SOLN
25.0000 ug | INTRAMUSCULAR | Status: DC | PRN
  Administered 2019-04-12 (×2): 25 ug via INTRAVENOUS

## 2019-04-12 MED ORDER — MIDAZOLAM HCL 2 MG/2ML IJ SOLN
INTRAMUSCULAR | Status: AC
  Filled 2019-04-12: qty 2

## 2019-04-12 MED ORDER — OXYCODONE HCL 5 MG PO TABS
5.0000 mg | ORAL_TABLET | Freq: Once | ORAL | Status: AC | PRN
  Administered 2019-04-12: 10:00:00 5 mg via ORAL

## 2019-04-12 MED ORDER — PROMETHAZINE HCL 25 MG/ML IJ SOLN: 6.2500 mg | INTRAMUSCULAR | Status: DC | PRN

## 2019-04-12 MED ORDER — OXYCODONE HCL 5 MG/5ML PO SOLN: 5.0000 mg | Freq: Once | ORAL | Status: AC | PRN

## 2019-04-12 MED ORDER — CEFAZOLIN SODIUM-DEXTROSE 2-4 GM/100ML-% IV SOLN
2.0000 g | INTRAVENOUS | Status: AC
  Administered 2019-04-12: 2 g via INTRAVENOUS
  Filled 2019-04-12: qty 100

## 2019-04-12 MED ORDER — ACETAMINOPHEN 500 MG PO TABS
1000.0000 mg | ORAL_TABLET | Freq: Once | ORAL | Status: AC
  Administered 2019-04-12: 07:00:00 1000 mg via ORAL
  Filled 2019-04-12: qty 2

## 2019-04-12 MED ORDER — DEXAMETHASONE SODIUM PHOSPHATE 10 MG/ML IJ SOLN
INTRAMUSCULAR | Status: DC | PRN
  Administered 2019-04-12: 4 mg via INTRAVENOUS

## 2019-04-12 MED ORDER — TOBRAMYCIN SULFATE 1.2 G IJ SOLR
INTRAMUSCULAR | Status: AC
  Filled 2019-04-12: qty 1.2

## 2019-04-12 MED ORDER — MIDAZOLAM HCL 5 MG/5ML IJ SOLN
INTRAMUSCULAR | Status: DC | PRN
  Administered 2019-04-12: 2 mg via INTRAVENOUS

## 2019-04-12 MED ORDER — OXYCODONE HCL 5 MG PO TABS
ORAL_TABLET | ORAL | Status: AC
  Filled 2019-04-12: qty 1

## 2019-04-12 MED ORDER — CHLORHEXIDINE GLUCONATE 4 % EX LIQD: 60.0000 mL | Freq: Once | CUTANEOUS | Status: DC

## 2019-04-12 MED ORDER — ONDANSETRON HCL 4 MG/2ML IJ SOLN
INTRAMUSCULAR | Status: DC | PRN
  Administered 2019-04-12: 4 mg via INTRAVENOUS

## 2019-04-12 MED ORDER — FENTANYL CITRATE (PF) 100 MCG/2ML IJ SOLN
INTRAMUSCULAR | Status: AC
  Filled 2019-04-12: qty 2

## 2019-04-12 MED ORDER — PROPOFOL 10 MG/ML IV BOLUS
INTRAVENOUS | Status: DC | PRN
  Administered 2019-04-12: 50 mg via INTRAVENOUS
  Administered 2019-04-12: 150 mg via INTRAVENOUS

## 2019-04-12 MED ORDER — LIDOCAINE 2% (20 MG/ML) 5 ML SYRINGE
INTRAMUSCULAR | Status: DC | PRN
  Administered 2019-04-12 (×2): 40 mg via INTRAVENOUS

## 2019-04-12 MED ORDER — VANCOMYCIN HCL 1000 MG IV SOLR
INTRAVENOUS | Status: AC
  Filled 2019-04-12: qty 1000

## 2019-04-12 MED ORDER — FENTANYL CITRATE (PF) 250 MCG/5ML IJ SOLN
INTRAMUSCULAR | Status: AC
  Filled 2019-04-12: qty 5

## 2019-04-12 MED ORDER — PHENYLEPHRINE 40 MCG/ML (10ML) SYRINGE FOR IV PUSH (FOR BLOOD PRESSURE SUPPORT)
PREFILLED_SYRINGE | INTRAVENOUS | Status: AC
  Filled 2019-04-12: qty 10

## 2019-04-12 SURGICAL SUPPLY — 64 items
BANDAGE ESMARK 6X9 LF (GAUZE/BANDAGES/DRESSINGS) ×1 IMPLANT
BNDG COHESIVE 6X5 TAN STRL LF (GAUZE/BANDAGES/DRESSINGS) ×3 IMPLANT
BNDG ELASTIC 4X5.8 VLCR STR LF (GAUZE/BANDAGES/DRESSINGS) ×3 IMPLANT
BNDG ELASTIC 6X5.8 VLCR STR LF (GAUZE/BANDAGES/DRESSINGS) ×3 IMPLANT
BNDG ESMARK 6X9 LF (GAUZE/BANDAGES/DRESSINGS) ×3
BNDG GAUZE ELAST 4 BULKY (GAUZE/BANDAGES/DRESSINGS) ×6 IMPLANT
BRUSH SCRUB EZ PLAIN DRY (MISCELLANEOUS) ×6 IMPLANT
CHLORAPREP W/TINT 26 (MISCELLANEOUS) ×3 IMPLANT
CLOSURE WOUND 1/2 X4 (GAUZE/BANDAGES/DRESSINGS)
COVER SURGICAL LIGHT HANDLE (MISCELLANEOUS) ×6 IMPLANT
COVER WAND RF STERILE (DRAPES) ×3 IMPLANT
CUFF TOURN SGL QUICK 18X4 (TOURNIQUET CUFF) IMPLANT
CUFF TOURN SGL QUICK 24 (TOURNIQUET CUFF)
CUFF TOURN SGL QUICK 34 (TOURNIQUET CUFF)
CUFF TRNQT CYL 24X4X16.5-23 (TOURNIQUET CUFF) IMPLANT
CUFF TRNQT CYL 34X4.125X (TOURNIQUET CUFF) IMPLANT
DERMABOND ADVANCED (GAUZE/BANDAGES/DRESSINGS) ×2
DERMABOND ADVANCED .7 DNX12 (GAUZE/BANDAGES/DRESSINGS) IMPLANT
DRAPE C-ARM 42X72 X-RAY (DRAPES) IMPLANT
DRAPE C-ARMOR (DRAPES) ×3 IMPLANT
DRAPE U-SHAPE 47X51 STRL (DRAPES) ×3 IMPLANT
DRSG ADAPTIC 3X8 NADH LF (GAUZE/BANDAGES/DRESSINGS) ×3 IMPLANT
ELECT REM PT RETURN 9FT ADLT (ELECTROSURGICAL) ×3
ELECTRODE REM PT RTRN 9FT ADLT (ELECTROSURGICAL) ×1 IMPLANT
GAUZE SPONGE 4X4 12PLY STRL (GAUZE/BANDAGES/DRESSINGS) ×3 IMPLANT
GLOVE BIO SURGEON STRL SZ 6.5 (GLOVE) ×6 IMPLANT
GLOVE BIO SURGEON STRL SZ7.5 (GLOVE) ×12 IMPLANT
GLOVE BIO SURGEONS STRL SZ 6.5 (GLOVE) ×3
GLOVE BIOGEL PI IND STRL 6.5 (GLOVE) ×1 IMPLANT
GLOVE BIOGEL PI IND STRL 7.5 (GLOVE) ×1 IMPLANT
GLOVE BIOGEL PI INDICATOR 6.5 (GLOVE) ×2
GLOVE BIOGEL PI INDICATOR 7.5 (GLOVE) ×2
GOWN STRL REUS W/ TWL LRG LVL3 (GOWN DISPOSABLE) ×2 IMPLANT
GOWN STRL REUS W/TWL LRG LVL3 (GOWN DISPOSABLE) ×4
KIT BASIN OR (CUSTOM PROCEDURE TRAY) ×3 IMPLANT
KIT TURNOVER KIT B (KITS) ×3 IMPLANT
MANIFOLD NEPTUNE II (INSTRUMENTS) ×3 IMPLANT
NEEDLE 22X1 1/2 (OR ONLY) (NEEDLE) IMPLANT
NS IRRIG 1000ML POUR BTL (IV SOLUTION) ×3 IMPLANT
PACK ORTHO EXTREMITY (CUSTOM PROCEDURE TRAY) ×3 IMPLANT
PAD ARMBOARD 7.5X6 YLW CONV (MISCELLANEOUS) ×6 IMPLANT
PADDING CAST COTTON 6X4 STRL (CAST SUPPLIES) ×9 IMPLANT
SPONGE LAP 18X18 RF (DISPOSABLE) ×3 IMPLANT
STAPLER VISISTAT 35W (STAPLE) IMPLANT
STOCKINETTE IMPERVIOUS LG (DRAPES) ×3 IMPLANT
STRIP CLOSURE SKIN 1/2X4 (GAUZE/BANDAGES/DRESSINGS) IMPLANT
SUCTION FRAZIER HANDLE 10FR (MISCELLANEOUS)
SUCTION TUBE FRAZIER 10FR DISP (MISCELLANEOUS) IMPLANT
SUT ETHILON 3 0 PS 1 (SUTURE) IMPLANT
SUT MNCRL AB 3-0 PS2 18 (SUTURE) ×3 IMPLANT
SUT MON AB 2-0 CT1 36 (SUTURE) ×3 IMPLANT
SUT PDS AB 2-0 CT1 27 (SUTURE) IMPLANT
SUT VIC AB 0 CT1 27 (SUTURE)
SUT VIC AB 0 CT1 27XBRD ANBCTR (SUTURE) IMPLANT
SUT VIC AB 2-0 CT1 27 (SUTURE)
SUT VIC AB 2-0 CT1 TAPERPNT 27 (SUTURE) IMPLANT
SYR CONTROL 10ML LL (SYRINGE) IMPLANT
TOWEL GREEN STERILE (TOWEL DISPOSABLE) ×6 IMPLANT
TOWEL GREEN STERILE FF (TOWEL DISPOSABLE) ×6 IMPLANT
TUBE CONNECTING 12'X1/4 (SUCTIONS) ×1
TUBE CONNECTING 12X1/4 (SUCTIONS) ×2 IMPLANT
UNDERPAD 30X30 (UNDERPADS AND DIAPERS) ×3 IMPLANT
WATER STERILE IRR 1000ML POUR (IV SOLUTION) ×6 IMPLANT
YANKAUER SUCT BULB TIP NO VENT (SUCTIONS) ×3 IMPLANT

## 2019-04-12 NOTE — Discharge Instructions (Addendum)
[facility] Discharge Instructions   General Discharge Instructions  WEIGHT BEARING STATUS: Weightbearing as tolerated bilateral lower extremities  RANGE OF MOTION/ACTIVITY: Unrestricted range of motion  Wound Care: You may remove surgical dressing on post-op day #2 (Sunday 8/300. Incisions can be left open to air if there is no drainage.  Okay to shower if no drainage from incisions.  DVT/PE prophylaxis: None  Diet: as you were eating previously.  Can use over the counter stool softeners and bowel preparations, such as Miralax, to help with bowel movements.  Narcotics can be constipating.  Be sure to drink plenty of fluids  PAIN MEDICATION USE AND EXPECTATIONS  You have likely been given narcotic medications to help control your pain.  After a traumatic event that results in an fracture (broken bone) with or without surgery, it is ok to use narcotic pain medications to help control one's pain.  We understand that everyone responds to pain differently and each individual patient will be evaluated on a regular basis for the continued need for narcotic medications. Ideally, narcotic medication use should last no more than 6-8 weeks (coinciding with fracture healing).   As a patient it is your responsibility as well to monitor narcotic medication use and report the amount and frequency you use these medications when you come to your office visit.   We would also advise that if you are using narcotic medications, you should take a dose prior to therapy to maximize you participation.  IF YOU ARE ON NARCOTIC MEDICATIONS IT IS NOT PERMISSIBLE TO OPERATE A MOTOR VEHICLE (MOTORCYCLE/CAR/TRUCK/MOPED) OR HEAVY MACHINERY DO NOT MIX NARCOTICS WITH OTHER CNS (CENTRAL NERVOUS SYSTEM) DEPRESSANTS SUCH AS ALCOHOL   STOP SMOKING OR USING NICOTINE PRODUCTS!!!!  As discussed nicotine severely impairs your body's ability to heal surgical and traumatic wounds but also impairs bone healing.   Wounds and bone heal by forming microscopic blood vessels (angiogenesis) and nicotine is a vasoconstrictor (essentially, shrinks blood vessels).  Therefore, if vasoconstriction occurs to these microscopic blood vessels they essentially disappear and are unable to deliver necessary nutrients to the healing tissue.  This is one modifiable factor that you can do to dramatically increase your chances of healing your injury.    (This means no smoking, no nicotine gum, patches, etc)  DO NOT USE NONSTEROIDAL ANTI-INFLAMMATORY DRUGS (NSAID'S)  Using products such as Advil (ibuprofen), Aleve (naproxen), Motrin (ibuprofen) for additional pain control during fracture healing can delay and/or prevent the healing response.  If you would like to take over the counter (OTC) medication, Tylenol (acetaminophen) is ok.  However, some narcotic medications that are given for pain control contain acetaminophen as well. Therefore, you should not exceed more than 4000 mg of tylenol in a day if you do not have liver disease.  Also note that there are may OTC medicines, such as cold medicines and allergy medicines that my contain tylenol as well.  If you have any questions about medications and/or interactions please ask your doctor/PA or your pharmacist.      ICE AND ELEVATE INJURED/OPERATIVE EXTREMITY  Using ice and elevating the injured extremity above your heart can help with swelling and pain control.  Icing in a pulsatile fashion, such as 20 minutes on and 20 minutes off, can be followed.    Do not place ice directly on skin. Make sure there is a barrier between to skin and the ice pack.    Using frozen items such as frozen peas works well as the  conform nicely to the are that needs to be iced.   CALL THE OFFICE WITH ANY QUESTIONS OR CONCERNS: [phone]   VISIT OUR WEBSITE FOR ADDITIONAL INFORMATION: [email or web address]      Discharge Wound Care Instructions  Do NOT apply any ointments, solutions or lotions to  pin sites or surgical wounds.  These prevent needed drainage and even though solutions like hydrogen peroxide kill bacteria, they also damage cells lining the pin sites that help fight infection.  Applying lotions or ointments can keep the wounds moist and can cause them to breakdown and open up as well. This can increase the risk for infection. When in doubt call the office.  Surgical incisions should be dressed daily.  If any drainage is noted, use one layer of adaptic, then gauze, Kerlix, and an ace wrap.  Once the incision is completely dry and without drainage, it may be left open to air out.  Showering may begin 36-48 hours later.  Cleaning gently with soap and water.  Traumatic wounds should be dressed daily as well.    One layer of adaptic, gauze, Kerlix, then ace wrap.  The adaptic can be discontinued once the draining has ceased    If you have a wet to dry dressing: wet the gauze with saline the squeeze as much saline out so the gauze is moist (not soaking wet), place moistened gauze over wound, then place a dry gauze over the moist one, followed by Kerlix wrap, then ace wrap.

## 2019-04-12 NOTE — Transfer of Care (Signed)
Immediate Anesthesia Transfer of Care Note  Patient: Olivia Melendez  Procedure(s) Performed: HARDWARE REMOVAL PELVIS (Bilateral )  Patient Location: PACU  Anesthesia Type:General  Level of Consciousness: awake, alert , oriented and sedated  Airway & Oxygen Therapy: Patient Spontanous Breathing and Patient connected to nasal cannula oxygen  Post-op Assessment: Report given to RN, Post -op Vital signs reviewed and stable and Patient moving all extremities  Post vital signs: Reviewed and stable  Last Vitals:  Vitals Value Taken Time  BP 139/92 04/12/19 0841  Temp    Pulse 78 04/12/19 0846  Resp 13 04/12/19 0846  SpO2 100 % 04/12/19 0846  Vitals shown include unvalidated device data.  Last Pain:  Vitals:   04/12/19 0540  TempSrc: Oral      Patients Stated Pain Goal: 3 (04/59/97 7414)  Complications: No apparent anesthesia complications

## 2019-04-12 NOTE — Interval H&P Note (Signed)
History and Physical Interval Note:  04/12/2019 7:09 AM  Olivia Melendez  has presented today for surgery, with the diagnosis of Painful orthopaedic hardware.  The various methods of treatment have been discussed with the patient and family. After consideration of risks, benefits and other options for treatment, the patient has consented to  Procedure(s): HARDWARE REMOVAL PELVIS (Bilateral) as a surgical intervention.  The patient's history has been reviewed, patient examined, no change in status, stable for surgery.  I have reviewed the patient's chart and labs.  Questions were answered to the patient's satisfaction.     Lennette Bihari P Haddix

## 2019-04-12 NOTE — Anesthesia Procedure Notes (Signed)
Procedure Name: Intubation Date/Time: 04/12/2019 7:42 AM Performed by: Scheryl Darter, CRNA Pre-anesthesia Checklist: Patient identified, Emergency Drugs available, Suction available and Patient being monitored Patient Re-evaluated:Patient Re-evaluated prior to induction Oxygen Delivery Method: Circle System Utilized Preoxygenation: Pre-oxygenation with 100% oxygen Induction Type: IV induction Ventilation: Mask ventilation without difficulty Laryngoscope Size: Miller and 2 Tube type: Oral Number of attempts: 1 Airway Equipment and Method: Stylet and Oral airway Placement Confirmation: ETT inserted through vocal cords under direct vision,  positive ETCO2 and breath sounds checked- equal and bilateral Secured at: 22 cm Tube secured with: Tape Dental Injury: Teeth and Oropharynx as per pre-operative assessment
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
## 2019-04-12 NOTE — Anesthesia Postprocedure Evaluation (Signed)
Anesthesia Post Note  Patient: Olivia Melendez  Procedure(s) Performed: HARDWARE REMOVAL PELVIS (Bilateral )     Patient location during evaluation: PACU Anesthesia Type: General Level of consciousness: awake and alert and oriented Pain management: pain level controlled Vital Signs Assessment: post-procedure vital signs reviewed and stable Respiratory status: spontaneous breathing, nonlabored ventilation and respiratory function stable Cardiovascular status: blood pressure returned to baseline Postop Assessment: no apparent nausea or vomiting Anesthetic complications: no    Last Vitals:  Vitals:   04/12/19 0930 04/12/19 0945  BP: (!) 130/99 (!) 170/89  Pulse: 61   Resp: 15   Temp: (!) 36.1 C 36.6 C  SpO2: 98% 99%    Last Pain:  Vitals:   04/12/19 0900  TempSrc:   PainSc: 10-Worst pain ever                 [name]

## 2019-04-13 ENCOUNTER — Encounter (HOSPITAL_COMMUNITY): Payer: Self-pay | Admitting: Student

## 2019-07-04 ENCOUNTER — Other Ambulatory Visit: Payer: Self-pay

## 2019-07-04 DIAGNOSIS — Z20822 Contact with and (suspected) exposure to covid-19: Secondary | ICD-10-CM

## 2019-07-08 LAB — NOVEL CORONAVIRUS, NAA: SARS-CoV-2, NAA: NOT DETECTED

## 2019-07-30 DIAGNOSIS — S329XXD Fracture of unspecified parts of lumbosacral spine and pelvis, subsequent encounter for fracture with routine healing: Secondary | ICD-10-CM | POA: Diagnosis not present

## 2019-08-12 ENCOUNTER — Ambulatory Visit: Payer: 59 | Attending: Internal Medicine

## 2019-08-12 DIAGNOSIS — Z20828 Contact with and (suspected) exposure to other viral communicable diseases: Secondary | ICD-10-CM | POA: Diagnosis not present

## 2019-08-12 DIAGNOSIS — Z20822 Contact with and (suspected) exposure to covid-19: Secondary | ICD-10-CM

## 2019-08-14 LAB — NOVEL CORONAVIRUS, NAA: SARS-CoV-2, NAA: NOT DETECTED

## 2019-08-20 ENCOUNTER — Ambulatory Visit: Payer: Self-pay
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
## 2019-08-21 DIAGNOSIS — Z03818 Encounter for observation for suspected exposure to other biological agents ruled out: Secondary | ICD-10-CM | POA: Diagnosis not present

## 2019-08-21 DIAGNOSIS — J029 Acute pharyngitis, unspecified: Secondary | ICD-10-CM | POA: Diagnosis not present

## 2019-08-21 DIAGNOSIS — R519 Headache, unspecified: Secondary | ICD-10-CM | POA: Diagnosis not present

## 2019-08-21 DIAGNOSIS — M791 Myalgia, unspecified site: Secondary | ICD-10-CM | POA: Diagnosis not present

## 2019-08-21 DIAGNOSIS — R05 Cough: Secondary | ICD-10-CM | POA: Diagnosis not present

## 2019-08-21 DIAGNOSIS — Z20828 Contact with and (suspected) exposure to other viral communicable diseases: Secondary | ICD-10-CM | POA: Diagnosis not present

## 2019-08-23 DIAGNOSIS — R300 Dysuria: Secondary | ICD-10-CM | POA: Diagnosis not present

## 2019-08-23 DIAGNOSIS — Z20828 Contact with and (suspected) exposure to other viral communicable diseases: Secondary | ICD-10-CM | POA: Diagnosis not present

## 2019-08-23 DIAGNOSIS — Z03818 Encounter for observation for suspected exposure to other biological agents ruled out: Secondary | ICD-10-CM | POA: Diagnosis not present

## 2019-08-29 DIAGNOSIS — Z6826 Body mass index (BMI) 26.0-26.9, adult: Secondary | ICD-10-CM | POA: Diagnosis not present

## 2019-08-29 DIAGNOSIS — Z30014 Encounter for initial prescription of intrauterine contraceptive device: Secondary | ICD-10-CM | POA: Diagnosis not present

## 2019-08-29 DIAGNOSIS — I10 Essential (primary) hypertension: Secondary | ICD-10-CM | POA: Diagnosis not present

## 2019-09-16 DIAGNOSIS — Z01812 Encounter for preprocedural laboratory examination: Secondary | ICD-10-CM | POA: Diagnosis not present

## 2019-09-16 DIAGNOSIS — Z20822 Contact with and (suspected) exposure to covid-19: Secondary | ICD-10-CM | POA: Diagnosis not present

## 2019-09-18 DIAGNOSIS — J3489 Other specified disorders of nose and nasal sinuses: Secondary | ICD-10-CM | POA: Diagnosis not present

## 2019-09-18 DIAGNOSIS — J342 Deviated nasal septum: Secondary | ICD-10-CM | POA: Diagnosis not present

## 2019-09-18 DIAGNOSIS — J343 Hypertrophy of nasal turbinates: Secondary | ICD-10-CM | POA: Diagnosis not present

## 2019-09-18 DIAGNOSIS — M950 Acquired deformity of nose: Secondary | ICD-10-CM | POA: Diagnosis not present

## 2019-11-28 DIAGNOSIS — Z6829 Body mass index (BMI) 29.0-29.9, adult: Secondary | ICD-10-CM | POA: Diagnosis not present

## 2019-11-28 DIAGNOSIS — F418 Other specified anxiety disorders: Secondary | ICD-10-CM | POA: Diagnosis not present

## 2019-11-28 DIAGNOSIS — I10 Essential (primary) hypertension: Secondary | ICD-10-CM | POA: Diagnosis not present

## 2019-12-09 DIAGNOSIS — D485 Neoplasm of uncertain behavior of skin: Secondary | ICD-10-CM | POA: Diagnosis not present

## 2019-12-13 DIAGNOSIS — F418 Other specified anxiety disorders: Secondary | ICD-10-CM | POA: Diagnosis not present

## 2019-12-13 DIAGNOSIS — I10 Essential (primary) hypertension: Secondary | ICD-10-CM | POA: Diagnosis not present

## 2019-12-13 DIAGNOSIS — R519 Headache, unspecified: Secondary | ICD-10-CM | POA: Diagnosis not present

## 2019-12-13 DIAGNOSIS — G8929 Other chronic pain: Secondary | ICD-10-CM | POA: Diagnosis not present

## 2019-12-13 DIAGNOSIS — Z6828 Body mass index (BMI) 28.0-28.9, adult: Secondary | ICD-10-CM | POA: Diagnosis not present

## 2019-12-20 DIAGNOSIS — J343 Hypertrophy of nasal turbinates: Secondary | ICD-10-CM | POA: Diagnosis not present

## 2019-12-20 DIAGNOSIS — J3489 Other specified disorders of nose and nasal sinuses: Secondary | ICD-10-CM | POA: Diagnosis not present

## 2019-12-20 DIAGNOSIS — J342 Deviated nasal septum: Secondary | ICD-10-CM | POA: Diagnosis not present

## 2019-12-20 DIAGNOSIS — Z09 Encounter for follow-up examination after completed treatment for conditions other than malignant neoplasm: Secondary | ICD-10-CM | POA: Diagnosis not present

## 2020-01-24 DIAGNOSIS — F419 Anxiety disorder, unspecified: Secondary | ICD-10-CM | POA: Diagnosis not present

## 2020-01-24 DIAGNOSIS — Z30432 Encounter for removal of intrauterine contraceptive device: Secondary | ICD-10-CM | POA: Diagnosis not present

## 2020-01-24 DIAGNOSIS — I10 Essential (primary) hypertension: Secondary | ICD-10-CM | POA: Diagnosis not present

## 2020-01-24 DIAGNOSIS — Z6828 Body mass index (BMI) 28.0-28.9, adult: Secondary | ICD-10-CM | POA: Diagnosis not present

## 2020-01-24 DIAGNOSIS — Z30011 Encounter for initial prescription of contraceptive pills: Secondary | ICD-10-CM | POA: Diagnosis not present

## 2020-04-22 DIAGNOSIS — Z03818 Encounter for observation for suspected exposure to other biological agents ruled out: Secondary | ICD-10-CM | POA: Diagnosis not present

## 2020-04-22 DIAGNOSIS — Z20822 Contact with and (suspected) exposure to covid-19: Secondary | ICD-10-CM | POA: Diagnosis not present

## 2020-05-01 DIAGNOSIS — Z6829 Body mass index (BMI) 29.0-29.9, adult: Secondary | ICD-10-CM | POA: Diagnosis not present

## 2020-05-01 DIAGNOSIS — R05 Cough: Secondary | ICD-10-CM | POA: Diagnosis not present

## 2020-05-01 DIAGNOSIS — F17210 Nicotine dependence, cigarettes, uncomplicated: Secondary | ICD-10-CM | POA: Diagnosis not present

## 2020-05-01 DIAGNOSIS — Z8616 Personal history of COVID-19: Secondary | ICD-10-CM | POA: Diagnosis not present

## 2020-05-01 DIAGNOSIS — I10 Essential (primary) hypertension: Secondary | ICD-10-CM | POA: Diagnosis not present

## 2020-05-01 DIAGNOSIS — Z975 Presence of (intrauterine) contraceptive device: Secondary | ICD-10-CM | POA: Diagnosis not present

## 2020-06-09 DIAGNOSIS — R300 Dysuria: Secondary | ICD-10-CM | POA: Diagnosis not present

## 2020-07-03 DIAGNOSIS — R300 Dysuria: Secondary | ICD-10-CM | POA: Diagnosis not present

## 2020-07-24 DIAGNOSIS — G4700 Insomnia, unspecified: Secondary | ICD-10-CM | POA: Diagnosis not present

## 2020-07-24 DIAGNOSIS — F419 Anxiety disorder, unspecified: Secondary | ICD-10-CM | POA: Diagnosis not present

## 2020-07-24 DIAGNOSIS — G8929 Other chronic pain: Secondary | ICD-10-CM | POA: Diagnosis not present

## 2020-07-24 DIAGNOSIS — I10 Essential (primary) hypertension: Secondary | ICD-10-CM | POA: Diagnosis not present

## 2020-07-24 DIAGNOSIS — R519 Headache, unspecified: Secondary | ICD-10-CM | POA: Diagnosis not present

## 2020-07-24 DIAGNOSIS — Z6829 Body mass index (BMI) 29.0-29.9, adult: Secondary | ICD-10-CM | POA: Diagnosis not present

## 2020-08-24 DIAGNOSIS — Z20822 Contact with and (suspected) exposure to covid-19: Secondary | ICD-10-CM | POA: Diagnosis not present

## 2020-08-24 DIAGNOSIS — Z03818 Encounter for observation for suspected exposure to other biological agents ruled out: Secondary | ICD-10-CM | POA: Diagnosis not present

## 2020-08-25 DIAGNOSIS — R0989 Other specified symptoms and signs involving the circulatory and respiratory systems: Secondary | ICD-10-CM | POA: Diagnosis not present

## 2020-08-25 DIAGNOSIS — R059 Cough, unspecified: Secondary | ICD-10-CM | POA: Diagnosis not present

## 2020-08-25 DIAGNOSIS — R0981 Nasal congestion: Secondary | ICD-10-CM | POA: Diagnosis not present

## 2020-11-16 ENCOUNTER — Ambulatory Visit
Admission: EM | Admit: 2020-11-16 | Discharge: 2020-11-16 | Disposition: A | Discharge status: Home / Self Care | Payer: Medicaid Other

## 2020-11-16 ENCOUNTER — Ambulatory Visit (INDEPENDENT_AMBULATORY_CARE_PROVIDER_SITE_OTHER): Payer: Medicaid Other

## 2020-11-16 ENCOUNTER — Other Ambulatory Visit: Payer: Self-pay

## 2020-11-16 DIAGNOSIS — M25511 Pain in right shoulder: Secondary | ICD-10-CM

## 2020-11-16 DIAGNOSIS — M898X1 Other specified disorders of bone, shoulder: Secondary | ICD-10-CM

## 2020-11-16 DIAGNOSIS — S93402A Sprain of unspecified ligament of left ankle, initial encounter: Secondary | ICD-10-CM

## 2020-11-16 MED ORDER — IBUPROFEN 600 MG PO TABS: 600.0000 mg | ORAL_TABLET | Freq: Four times a day (QID) | ORAL | 0 refills | Status: DC | PRN

## 2020-11-16 MED ORDER — HYDROCODONE-ACETAMINOPHEN 5-325 MG PO TABS: 1.0000 | ORAL_TABLET | Freq: Four times a day (QID) | ORAL | 0 refills | Status: AC | PRN

## 2020-11-16 NOTE — Discharge Instructions (Addendum)
Wear the walking boot for the next 2 weeks to help support your ankle and prevent further injury.  Keep your left ankle elevated as much as possible to reduce swelling, improve blood flow, and aid in healing.  Wear the sling when up and moving around on your right shoulder to help your shoulder rest.  If your pain does not improve within 1 to 2 weeks follow-up with orthopedics.  Use the ibuprofen for mild to moderate pain every 6 hours with food.  Use the Norco, 1 to 2 tablets every 6 hours, as needed for severe pain.
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
## 2020-11-26 DIAGNOSIS — H029 Unspecified disorder of eyelid: Secondary | ICD-10-CM | POA: Diagnosis not present

## 2020-11-26 DIAGNOSIS — F411 Generalized anxiety disorder: Secondary | ICD-10-CM | POA: Diagnosis not present

## 2020-11-26 DIAGNOSIS — F4310 Post-traumatic stress disorder, unspecified: Secondary | ICD-10-CM | POA: Diagnosis not present

## 2020-12-08 DIAGNOSIS — B850 Pediculosis due to Pediculus humanus capitis: Secondary | ICD-10-CM | POA: Diagnosis not present

## 2020-12-08 DIAGNOSIS — Z6833 Body mass index (BMI) 33.0-33.9, adult: Secondary | ICD-10-CM | POA: Diagnosis not present

## 2021-02-28 ENCOUNTER — Encounter: Payer: Self-pay | Admitting: Emergency Medicine

## 2021-02-28 ENCOUNTER — Emergency Department
Admission: EM | Admit: 2021-02-28 | Discharge: 2021-03-01 | Disposition: A | Discharge status: Home / Self Care | Payer: Medicaid Other | Attending: Emergency Medicine | Admitting: Emergency Medicine

## 2021-02-28 ENCOUNTER — Other Ambulatory Visit: Payer: Self-pay

## 2021-02-28 DIAGNOSIS — T1491XA Suicide attempt, initial encounter: Secondary | ICD-10-CM | POA: Diagnosis not present

## 2021-02-28 DIAGNOSIS — T43224A Poisoning by selective serotonin reuptake inhibitors, undetermined, initial encounter: Secondary | ICD-10-CM | POA: Diagnosis present

## 2021-02-28 DIAGNOSIS — Z79899 Other long term (current) drug therapy: Secondary | ICD-10-CM | POA: Insufficient documentation

## 2021-02-28 DIAGNOSIS — T50904A Poisoning by unspecified drugs, medicaments and biological substances, undetermined, initial encounter: Secondary | ICD-10-CM | POA: Diagnosis not present

## 2021-02-28 DIAGNOSIS — F419 Anxiety disorder, unspecified: Secondary | ICD-10-CM | POA: Insufficient documentation

## 2021-02-28 DIAGNOSIS — T402X1A Poisoning by other opioids, accidental (unintentional), initial encounter: Secondary | ICD-10-CM | POA: Diagnosis not present

## 2021-02-28 DIAGNOSIS — F17210 Nicotine dependence, cigarettes, uncomplicated: Secondary | ICD-10-CM | POA: Insufficient documentation

## 2021-02-28 DIAGNOSIS — F339 Major depressive disorder, recurrent, unspecified: Secondary | ICD-10-CM

## 2021-02-28 DIAGNOSIS — I10 Essential (primary) hypertension: Secondary | ICD-10-CM | POA: Diagnosis not present

## 2021-02-28 DIAGNOSIS — F332 Major depressive disorder, recurrent severe without psychotic features: Secondary | ICD-10-CM | POA: Diagnosis not present

## 2021-02-28 DIAGNOSIS — F29 Unspecified psychosis not due to a substance or known physiological condition: Secondary | ICD-10-CM | POA: Diagnosis not present

## 2021-02-28 DIAGNOSIS — F1010 Alcohol abuse, uncomplicated: Secondary | ICD-10-CM

## 2021-02-28 DIAGNOSIS — T887XXA Unspecified adverse effect of drug or medicament, initial encounter: Secondary | ICD-10-CM | POA: Diagnosis not present

## 2021-02-28 LAB — CBC WITH DIFFERENTIAL/PLATELET
Abs Immature Granulocytes: 0.03 10*3/uL (ref 0.00–0.07)
Basophils Absolute: 0.1 10*3/uL (ref 0.0–0.1)
Basophils Relative: 1 %
Eosinophils Absolute: 0.1 10*3/uL (ref 0.0–0.5)
Eosinophils Relative: 1 %
HCT: 39.3 % (ref 36.0–46.0)
Hemoglobin: 13.9 g/dL (ref 12.0–15.0)
Immature Granulocytes: 0 %
Lymphocytes Relative: 38 %
Lymphs Abs: 3.4 10*3/uL (ref 0.7–4.0)
MCH: 33.6 pg (ref 26.0–34.0)
MCHC: 35.4 g/dL (ref 30.0–36.0)
MCV: 94.9 fL (ref 80.0–100.0)
Monocytes Absolute: 0.4 10*3/uL (ref 0.1–1.0)
Monocytes Relative: 5 %
Neutro Abs: 4.9 10*3/uL (ref 1.7–7.7)
Neutrophils Relative %: 55 %
Platelets: 236 10*3/uL (ref 150–400)
RBC: 4.14 MIL/uL (ref 3.87–5.11)
RDW: 13 % (ref 11.5–15.5)
WBC: 8.9 10*3/uL (ref 4.0–10.5)
nRBC: 0 % (ref 0.0–0.2)

## 2021-02-28 LAB — COMPREHENSIVE METABOLIC PANEL
ALT: 17 U/L (ref 0–44)
AST: 33 U/L (ref 15–41)
Albumin: 4.2 g/dL (ref 3.5–5.0)
Alkaline Phosphatase: 74 U/L (ref 38–126)
Anion gap: 10 (ref 5–15)
BUN: 8 mg/dL (ref 6–20)
CO2: 20 mmol/L — ABNORMAL LOW (ref 22–32)
Calcium: 8.8 mg/dL — ABNORMAL LOW (ref 8.9–10.3)
Chloride: 105 mmol/L (ref 98–111)
Creatinine, Ser: 0.67 mg/dL (ref 0.44–1.00)
GFR, Estimated: >60 mL/min (ref >60)
Glucose, Bld: 92 mg/dL (ref 70–99)
Potassium: 3.4 mmol/L — ABNORMAL LOW (ref 3.5–5.1)
Sodium: 135 mmol/L (ref 135–145)
Total Bilirubin: 0.8 mg/dL (ref 0.3–1.2)
Total Protein: 7.6 g/dL (ref 6.5–8.1)

## 2021-02-28 LAB — SALICYLATE LEVEL: Salicylate Lvl: <7 mg/dL — ABNORMAL LOW (ref 7.0–30.0)

## 2021-02-28 LAB — ETHANOL: Alcohol, Ethyl (B): 190 mg/dL — ABNORMAL HIGH (ref <10)

## 2021-02-28 LAB — ACETAMINOPHEN LEVEL: Acetaminophen (Tylenol), Serum: 12 ug/mL (ref 10–30)

## 2021-02-28 NOTE — ED Notes (Signed)
1 pack of cigarrettes 1 pair black flip flops 1 pair black shorts 1 pair underwear 1 gray t shirt 1 black hair tie 1 apple watch with multi strand band 1 pair [employer] earrings with clear stones   Patient medications sent to pharmacy

## 2021-02-28 NOTE — ED Triage Notes (Signed)
Pt to ED via [facility] with c/o drug overdose. Per EMS pt took approx 8-9 Lexapro and 6-9 hydrocode at approx 2030. Per EMS pt has had increasing external stressors, and made a comment to her boyfriend earlier that she wanted to go home and take every pill that she has. Per EMS pt was seen by her SO taking her medications. Per EMS pt came voluntarily.   Pt denies SI/HI. Pt states she was not attempting to harm herself, pt states she was wanting to go to sleep.
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
## 2021-03-01 DIAGNOSIS — F1010 Alcohol abuse, uncomplicated: Secondary | ICD-10-CM

## 2021-03-01 DIAGNOSIS — T1491XA Suicide attempt, initial encounter: Secondary | ICD-10-CM

## 2021-03-01 DIAGNOSIS — F332 Major depressive disorder, recurrent severe without psychotic features: Secondary | ICD-10-CM

## 2021-03-01 DIAGNOSIS — T50901A Poisoning by unspecified drugs, medicaments and biological substances, accidental (unintentional), initial encounter: Secondary | ICD-10-CM | POA: Insufficient documentation

## 2021-03-01 DIAGNOSIS — I10 Essential (primary) hypertension: Secondary | ICD-10-CM | POA: Diagnosis not present

## 2021-03-01 DIAGNOSIS — T50904A Poisoning by unspecified drugs, medicaments and biological substances, undetermined, initial encounter: Secondary | ICD-10-CM | POA: Diagnosis not present

## 2021-03-01 DIAGNOSIS — T402X1A Poisoning by other opioids, accidental (unintentional), initial encounter: Secondary | ICD-10-CM | POA: Diagnosis not present

## 2021-03-01 DIAGNOSIS — F339 Major depressive disorder, recurrent, unspecified: Secondary | ICD-10-CM

## 2021-03-01 LAB — ACETAMINOPHEN LEVEL: Acetaminophen (Tylenol), Serum: <10 ug/mL — ABNORMAL LOW (ref 10–30)
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
## 2021-03-01 NOTE — ED Notes (Signed)
Patient is resting comfortably. 

## 2021-03-01 NOTE — ED Notes (Signed)
[place] at bedside 
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
## 2021-03-01 NOTE — ED Notes (Signed)
Pt declined d/c vitals. Provided all belongings back. Pt provided medications back from pharmacy that were brought on arrival. Denies other needs. Ambulatory, NAD noted.

## 2021-03-01 NOTE — ED Notes (Signed)
Pt provided phone to call family.

## 2021-03-01 NOTE — ED Notes (Signed)
Dietary contacted for breakfast tray 

## 2021-03-01 NOTE — ED Provider Notes (Signed)
Ivc reversed by Dr. Toni Amend and cleared for discharge home. Pt contracting for safety with them.    Concha Se, MD 03/01/21 [phone]
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
## 2021-03-01 NOTE — ED Notes (Signed)
Spoke to [facility] poison control they no need for repeat EKG since the pt appears clinically fine.

## 2021-03-01 NOTE — Discharge Instructions (Addendum)
You are cleared by psychiatry for discharge home.  Please follow-up on the resources for help and return to the ER if you have worsening thoughts or any other concerns

## 2021-03-01 NOTE — ED Provider Notes (Signed)
Emergency Medicine Observation Re-evaluation Note  Olivia Melendez is a 40 y.o. female, seen on rounds today.  Pt initially presented to the ED for complaints of No chief complaint on file. Currently, the patient is resting.  Physical Exam  BP 112/70   Pulse 72   Temp 98.3 F (36.8 C) (Oral)   Resp 13   Ht 1.613 m (5' 3.5")   Wt 83.9 kg   SpO2 96%   BMI 32.26 kg/m  Physical Exam General: No acute distress Cardiac: Good peripheral perfusion Lungs: Breathing easily and comfortably Psych: No behavioral issues at this time.   ED Course / MDM  EKG:   I have reviewed the labs performed to date as well as medications administered while in observation.  Recent changes in the last 24 hours include initial evaluation by EDP, medical clearance, and evaluation by psychiatry.  The patient admitted to Olivia Melendez (the psychiatry NP) that the overdose was an intentional suicide attempt.   Plan  Current plan is for psychiatric inpatient treatment. Patient is under full IVC at this time.   Olivia Rose, MD 03/01/21 [phone]

## 2021-03-01 NOTE — ED Notes (Signed)
Spoke to [employer] and they recommend a Repeat EKG at 6 hr Monitor mark , if that is fine she will be medically cleared

## 2021-03-02 ENCOUNTER — Telehealth: Payer: Self-pay

## 2021-03-02 DIAGNOSIS — F330 Major depressive disorder, recurrent, mild: Secondary | ICD-10-CM

## 2021-03-02 DIAGNOSIS — T50901A Poisoning by unspecified drugs, medicaments and biological substances, accidental (unintentional), initial encounter: Secondary | ICD-10-CM

## 2021-03-02 NOTE — Telephone Encounter (Signed)
Transition Care Management Follow-up Telephone Call Date of discharge and from where: 03/01/2021-[facility] How have you been since you were released from the hospital? Doing Fine but feels a little depressed.  Any questions or concerns? No  Items Reviewed: Did the pt receive and understand the discharge instructions provided? Yes  Medications obtained and verified?  No medications given Other? No  Any new allergies since your discharge? No  Dietary orders reviewed? N/A Do you have support at home? Yes   Home Care and Equipment/Supplies: Were home health services ordered? not applicable If so, what is the name of the agency? N/A  Has the agency set up a time to come to the patient's home? not applicable Were any new equipment or medical supplies ordered?  No What is the name of the medical supply agency? N/A Were you able to get the supplies/equipment? not applicable Do you have any questions related to the use of the equipment or supplies? No  Functional Questionnaire: (I = Independent and D = Dependent) ADLs: I  Bathing/Dressing- I  Meal Prep- I  Eating- I  Maintaining continence- I  Transferring/Ambulation- I  Managing Meds- I  Follow up appointments reviewed:  PCP Hospital f/u appt confirmed? No   Specialist Hospital f/u appt confirmed? No  Provided patient with information to [facility] to assist with finding a psychiatrist. Are transportation arrangements needed? No  If their condition worsens, is the pt aware to call PCP or go to the Emergency Dept.? Yes Was the patient provided with contact information for the PCP's office or ED? Yes Was to pt encouraged to call back with questions or concerns? Yes   Order has been placed for referral for new PCP and referral for Psychiatry.

## 2021-03-04 DIAGNOSIS — T50904A Poisoning by unspecified drugs, medicaments and biological substances, undetermined, initial encounter: Secondary | ICD-10-CM | POA: Diagnosis not present

## 2021-03-04 DIAGNOSIS — Z6832 Body mass index (BMI) 32.0-32.9, adult: Secondary | ICD-10-CM | POA: Diagnosis not present

## 2021-03-04 DIAGNOSIS — F321 Major depressive disorder, single episode, moderate: Secondary | ICD-10-CM | POA: Diagnosis not present

## 2021-03-05 ENCOUNTER — Other Ambulatory Visit: Payer: Self-pay | Admitting: Licensed Clinical Social Worker
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
## 2021-03-05 NOTE — Patient Instructions (Signed)
Visit Information  Olivia Melendez was given information about Medicaid Managed Care team care coordination services as a part of their [place] Complete Medicaid benefit. Olivia Melendez verbally consented to engagement with the [facility] Managed Care team.   For questions related to your [facility] Complete Medicaid health plan, please call: [phone]  If you would like to schedule transportation through your [place] Complete Medicaid plan, please call the following number at least 2 days in advance of your appointment: [phone].   Call the Behavioral Health Crisis Line at [phone], at any time, 24 hours a day, 7 days a week. If you are in danger or need immediate medical attention call 911.  If you would like help to quit smoking, call 1-800-QUIT-NOW (925 144 9366) OR Espaol: 1-855-Djelo-Ya (4-496-759-1638) o para ms informacin haga clic aqu or Text READY to 466-599 to register via text  Olivia Melendez - following are the goals we discussed in your visit today:   Goals Addressed             This Visit's Progress    Begin and Stick with Counseling-Depression       Timeframe:  Long-Range Goal Priority:  High Start Date:   03/05/21                          Expected End Date:   ongoing                 Follow Up Date 03/12/21   - check out counseling - keep 90 percent of counseling appointments - schedule counseling appointment    Why is this important?   Beating depression may take some time.  If you don't feel better right away, don't give up on your treatment plan.    Notes:        Dickie La, BSW, MSW, [employer] Managed Medicaid LCSW [facility]  [email or web address] Phone: [phone]

## 2021-03-06 ENCOUNTER — Encounter: Payer: Self-pay | Admitting: *Deleted

## 2021-03-06 ENCOUNTER — Emergency Department
Admission: EM | Admit: 2021-03-06 | Discharge: 2021-03-07 | Disposition: A | Discharge status: Home / Self Care | Payer: Medicaid Other | Source: Home / Self Care | Attending: Emergency Medicine | Admitting: Emergency Medicine

## 2021-03-06 ENCOUNTER — Other Ambulatory Visit: Payer: Self-pay

## 2021-03-06 DIAGNOSIS — F329 Major depressive disorder, single episode, unspecified: Secondary | ICD-10-CM | POA: Insufficient documentation

## 2021-03-06 DIAGNOSIS — Z20822 Contact with and (suspected) exposure to covid-19: Secondary | ICD-10-CM | POA: Insufficient documentation

## 2021-03-06 DIAGNOSIS — F1190 Opioid use, unspecified, uncomplicated: Secondary | ICD-10-CM | POA: Insufficient documentation

## 2021-03-06 DIAGNOSIS — F17210 Nicotine dependence, cigarettes, uncomplicated: Secondary | ICD-10-CM | POA: Insufficient documentation

## 2021-03-06 DIAGNOSIS — Y906 Blood alcohol level of 120-199 mg/100 ml: Secondary | ICD-10-CM | POA: Insufficient documentation

## 2021-03-06 DIAGNOSIS — I10 Essential (primary) hypertension: Secondary | ICD-10-CM | POA: Insufficient documentation

## 2021-03-06 DIAGNOSIS — T402X2A Poisoning by other opioids, intentional self-harm, initial encounter: Secondary | ICD-10-CM | POA: Insufficient documentation

## 2021-03-06 DIAGNOSIS — Z79899 Other long term (current) drug therapy: Secondary | ICD-10-CM | POA: Insufficient documentation

## 2021-03-06 DIAGNOSIS — T40602A Poisoning by unspecified narcotics, intentional self-harm, initial encounter: Secondary | ICD-10-CM

## 2021-03-06 LAB — CBC
HCT: 39.2 % (ref 36.0–46.0)
Hemoglobin: 14 g/dL (ref 12.0–15.0)
MCH: 34.5 pg — ABNORMAL HIGH (ref 26.0–34.0)
MCHC: 35.7 g/dL (ref 30.0–36.0)
MCV: 96.6 fL (ref 80.0–100.0)
Platelets: 261 10*3/uL (ref 150–400)
RBC: 4.06 MIL/uL (ref 3.87–5.11)
RDW: 12.6 % (ref 11.5–15.5)
WBC: 10.8 10*3/uL — ABNORMAL HIGH (ref 4.0–10.5)
nRBC: 0 % (ref 0.0–0.2)
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
## 2021-03-07 ENCOUNTER — Inpatient Hospital Stay
Admission: RE | Admit: 2021-03-07 | Discharge: 2021-03-10 | DRG: 918 | Disposition: A | Discharge status: Home / Self Care | Payer: Medicaid Other | Source: Intra-hospital | Attending: Psychiatry | Admitting: Psychiatry

## 2021-03-07 DIAGNOSIS — I10 Essential (primary) hypertension: Secondary | ICD-10-CM | POA: Diagnosis present

## 2021-03-07 DIAGNOSIS — F17210 Nicotine dependence, cigarettes, uncomplicated: Secondary | ICD-10-CM | POA: Diagnosis not present

## 2021-03-07 DIAGNOSIS — Z79899 Other long term (current) drug therapy: Secondary | ICD-10-CM

## 2021-03-07 DIAGNOSIS — T402X2A Poisoning by other opioids, intentional self-harm, initial encounter: Secondary | ICD-10-CM | POA: Diagnosis not present

## 2021-03-07 DIAGNOSIS — G4700 Insomnia, unspecified: Secondary | ICD-10-CM | POA: Diagnosis present

## 2021-03-07 DIAGNOSIS — T50901A Poisoning by unspecified drugs, medicaments and biological substances, accidental (unintentional), initial encounter: Secondary | ICD-10-CM | POA: Diagnosis present

## 2021-03-07 DIAGNOSIS — Z720 Tobacco use: Secondary | ICD-10-CM | POA: Diagnosis present

## 2021-03-07 DIAGNOSIS — F332 Major depressive disorder, recurrent severe without psychotic features: Principal | ICD-10-CM | POA: Diagnosis present

## 2021-03-07 DIAGNOSIS — F329 Major depressive disorder, single episode, unspecified: Secondary | ICD-10-CM | POA: Insufficient documentation

## 2021-03-07 DIAGNOSIS — Z20822 Contact with and (suspected) exposure to covid-19: Secondary | ICD-10-CM | POA: Diagnosis not present

## 2021-03-07 DIAGNOSIS — T43222A Poisoning by selective serotonin reuptake inhibitors, intentional self-harm, initial encounter: Principal | ICD-10-CM | POA: Diagnosis present

## 2021-03-07 HISTORY — DX: Depression, unspecified: F32.A

## 2021-03-07 HISTORY — DX: Anxiety disorder, unspecified: F41.9

## 2021-03-07 LAB — URINE DRUG SCREEN, QUALITATIVE (ARMC ONLY)
Amphetamines, Ur Screen: NOT DETECTED
Barbiturates, Ur Screen: NOT DETECTED
Benzodiazepine, Ur Scrn: NOT DETECTED
Cannabinoid 50 Ng, Ur ~~LOC~~: NOT DETECTED
Cocaine Metabolite,Ur ~~LOC~~: NOT DETECTED
MDMA (Ecstasy)Ur Screen: NOT DETECTED
Methadone Scn, Ur: NOT DETECTED
Opiate, Ur Screen: POSITIVE — AB
Phencyclidine (PCP) Ur S: NOT DETECTED
Tricyclic, Ur Screen: NOT DETECTED

## 2021-03-07 LAB — COMPREHENSIVE METABOLIC PANEL
ALT: 21 U/L (ref 0–44)
AST: 27 U/L (ref 15–41)
Albumin: 4.4 g/dL (ref 3.5–5.0)
Alkaline Phosphatase: 81 U/L (ref 38–126)
Anion gap: 10 (ref 5–15)
BUN: 10 mg/dL (ref 6–20)
CO2: 21 mmol/L — ABNORMAL LOW (ref 22–32)
Calcium: 9.1 mg/dL (ref 8.9–10.3)
Chloride: 106 mmol/L (ref 98–111)
Creatinine, Ser: 0.71 mg/dL (ref 0.44–1.00)
GFR, Estimated: >60 mL/min (ref >60)
Glucose, Bld: 102 mg/dL — ABNORMAL HIGH (ref 70–99)
Potassium: 2.8 mmol/L — ABNORMAL LOW (ref 3.5–5.1)
Sodium: 137 mmol/L (ref 135–145)
Total Bilirubin: 0.5 mg/dL (ref 0.3–1.2)
Total Protein: 8 g/dL (ref 6.5–8.1)

## 2021-03-07 LAB — POC URINE PREG, ED: Preg Test, Ur: NEGATIVE

## 2021-03-07 LAB — RESP PANEL BY RT-PCR (FLU A&B, COVID) ARPGX2
Influenza A by PCR: NEGATIVE
Influenza B by PCR: NEGATIVE
SARS Coronavirus 2 by RT PCR: NEGATIVE

## 2021-03-07 LAB — ETHANOL: Alcohol, Ethyl (B): 126 mg/dL — ABNORMAL HIGH (ref <10)

## 2021-03-07 LAB — ACETAMINOPHEN LEVEL
Acetaminophen (Tylenol), Serum: 49 ug/mL — ABNORMAL HIGH (ref 10–30)
Acetaminophen (Tylenol), Serum: 28 ug/mL (ref 10–30)

## 2021-03-07 LAB — SALICYLATE LEVEL: Salicylate Lvl: <7 mg/dL — ABNORMAL LOW (ref 7.0–30.0)

## 2021-03-07 MED ORDER — ALUM & MAG HYDROXIDE-SIMETH 200-200-20 MG/5ML PO SUSP
30.0000 mL | ORAL | Status: DC | PRN
Start: 2021-03-07 — End: 2021-03-10

## 2021-03-07 MED ORDER — IBUPROFEN 600 MG PO TABS
600.0000 mg | ORAL_TABLET | Freq: Four times a day (QID) | ORAL | Status: DC | PRN
Start: 2021-03-07 — End: 2021-03-07
  Administered 2021-03-07: 600 mg via ORAL
  Filled 2021-03-07: qty 1

## 2021-03-07 MED ORDER — MAGNESIUM HYDROXIDE 400 MG/5ML PO SUSP: 30.0000 mL | Freq: Every day | ORAL | Status: DC | PRN

## 2021-03-07 MED ORDER — ACETAMINOPHEN 325 MG PO TABS
650.0000 mg | ORAL_TABLET | Freq: Four times a day (QID) | ORAL | Status: DC | PRN
  Administered 2021-03-08: 650 mg via ORAL
  Filled 2021-03-07: qty 2

## 2021-03-07 MED ORDER — TRAZODONE HCL 50 MG PO TABS
50.0000 mg | ORAL_TABLET | Freq: Every evening | ORAL | Status: DC | PRN
  Administered 2021-03-08 – 2021-03-09 (×2): 50 mg via ORAL
  Filled 2021-03-07: qty 1

## 2021-03-07 MED ORDER — POTASSIUM CHLORIDE CRYS ER 20 MEQ PO TBCR
40.0000 meq | EXTENDED_RELEASE_TABLET | Freq: Once | ORAL | Status: DC
  Filled 2021-03-07: qty 2

## 2021-03-07 NOTE — Plan of Care (Signed)
  Problem: Education: Goal: Knowledge of [facility] Education information/materials will improve Outcome: Progressing Goal: Emotional status will improve Outcome: Progressing Goal: Mental status will improve Outcome: Progressing Goal: Verbalization of understanding the information provided will improve Outcome: Progressing   Problem: Activity: Goal: Interest or engagement in activities will improve Outcome: Progressing Goal: Sleeping patterns will improve Outcome: Progressing   Problem: Coping: Goal: Ability to verbalize frustrations and anger appropriately will improve Outcome: Progressing Goal: Ability to demonstrate self-control will improve Outcome: Progressing   Problem: Health Behavior/Discharge Planning: Goal: Identification of resources available to assist in meeting health care needs will improve Outcome: Progressing Goal: Compliance with treatment plan for underlying cause of condition will improve Outcome: Progressing   Problem: Physical Regulation: Goal: Ability to maintain clinical measurements within normal limits will improve Outcome: Progressing   Problem: Safety: Goal: Periods of time without injury will increase Outcome: Progressing   Problem: Education: Goal: Utilization of techniques to improve thought processes will improve Outcome: Progressing Goal: Knowledge of the prescribed therapeutic regimen will improve Outcome: Progressing   Problem: Activity: Goal: Interest or engagement in leisure activities will improve Outcome: Progressing Goal: Imbalance in normal sleep/wake cycle will improve Outcome: Progressing   Problem: Coping: Goal: Coping ability will improve Outcome: Progressing Goal: Will verbalize feelings Outcome: Progressing   Problem: Health Behavior/Discharge Planning: Goal: Ability to make decisions will improve Outcome: Progressing Goal: Compliance with therapeutic regimen will improve Outcome: Progressing    Problem: Role Relationship: Goal: Will demonstrate positive changes in social behaviors and relationships Outcome: Progressing   Problem: Safety: Goal: Ability to disclose and discuss suicidal ideas will improve Outcome: Progressing Goal: Ability to identify and utilize support systems that promote safety will improve Outcome: Progressing   Problem: Self-Concept: Goal: Will verbalize positive feelings about self Outcome: Progressing Goal: Level of anxiety will decrease Outcome: Progressing   

## 2021-03-07 NOTE — ED Notes (Signed)
Took over patient care, resting, no complaints, said that she will continue to try to give urine

## 2021-03-07 NOTE — BH Assessment (Signed)
Will admit patient to Behavioral health unit when medically cleared.

## 2021-03-07 NOTE — ED Notes (Signed)
IVC, pending consult 

## 2021-03-07 NOTE — ED Notes (Signed)
This RN spoke with Clayborne Dana at poison control Per Clayborne Dana, patient needs to be monitored for respiratory and CNS depression Narcan can be given if respiratory depression occurs If repeat Tylenol level is less than 150 at the 4 hour mark, and no narcan has been given, patient will be considered cleared Sitter remains at bedside with patient Patient on cardiac monitor, VSS Will continue to monitor

## 2021-03-07 NOTE — ED Notes (Signed)
Per EDP patient is medically cleared.

## 2021-03-07 NOTE — ED Notes (Signed)
Pt given breakfast tray

## 2021-03-07 NOTE — BH Assessment (Addendum)
Patient is to be admitted to [facility] by Dr. Lucky Cowboy.  Admission orders entered by T. Maryclare Labrador NP Attending Physician will be Dr. Neale Burly.   Patient has been assigned to room 306, by [facility] Charge Nurse Gigi.   ER staff is aware of the admission: French Ana, ER Secretary   Amy B., Patient's nurse Dr. Katrinka Blazing, ER MD  Lynelle Doctor., Patient Access
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
## 2021-03-07 NOTE — ED Notes (Signed)
Pt lunch tray given  

## 2021-03-07 NOTE — ED Notes (Signed)
Pt given phone to call family.

## 2021-03-07 NOTE — Tx Team (Signed)
Initial Treatment Plan 03/07/2021 11:42 PM Olivia Melendez [id number]    PATIENT STRESSORS: Marital or family conflict   PATIENT STRENGTHS: Ability for insight Active sense of humor Average or above average intelligence Capable of independent living Communication skills General fund of knowledge   PATIENT IDENTIFIED PROBLEMS:     suicidal                 DISCHARGE CRITERIA:  Ability to meet basic life and health needs Adequate post-discharge living arrangements Improved stabilization in mood, thinking, and/or behavior Medical problems require only outpatient monitoring Motivation to continue treatment in a less acute level of care Need for constant or close observation no longer present Reduction of life-threatening or endangering symptoms to within safe limits Safe-care adequate arrangements made Verbal commitment to aftercare and medication compliance  PRELIMINARY DISCHARGE PLAN: Outpatient therapy Return to previous living arrangement  PATIENT/FAMILY INVOLVEMENT: This treatment plan has been presented to and reviewed with the patient, Olivia Melendez, and/or family member.  The patient and family have been given the opportunity to ask questions and make suggestions.  Billy Coast, RN 03/07/2021, 11:42 PM

## 2021-03-07 NOTE — ED Notes (Signed)
Pt c/o HA. MD made aware.

## 2021-03-07 NOTE — ED Notes (Signed)
Pt given ice cream

## 2021-03-07 NOTE — ED Notes (Signed)
Pt has one bag of belongings and a gray blanket. Belongings given to ED tech and pt to be escorted by tech and security.

## 2021-03-08 ENCOUNTER — Encounter: Payer: Self-pay | Admitting: Behavioral Health

## 2021-03-08 DIAGNOSIS — F332 Major depressive disorder, recurrent severe without psychotic features: Secondary | ICD-10-CM | POA: Diagnosis present

## 2021-03-08 LAB — TSH: TSH: 2.665 u[IU]/mL (ref 0.350–4.500)

## 2021-03-08 MED ORDER — NICOTINE POLACRILEX 2 MG MT GUM
2.0000 mg | CHEWING_GUM | OROMUCOSAL | Status: DC | PRN
  Administered 2021-03-08: 2 mg via ORAL
  Filled 2021-03-08: qty 1

## 2021-03-08 MED ORDER — BUPROPION HCL ER (XL) 150 MG PO TB24
150.0000 mg | ORAL_TABLET | Freq: Every day | ORAL | Status: DC
  Administered 2021-03-08 – 2021-03-10 (×3): 150 mg via ORAL
  Filled 2021-03-08 (×3): qty 1

## 2021-03-08 MED ORDER — POTASSIUM CHLORIDE CRYS ER 20 MEQ PO TBCR
40.0000 meq | EXTENDED_RELEASE_TABLET | Freq: Two times a day (BID) | ORAL | Status: AC
  Administered 2021-03-08 (×2): 40 meq via ORAL
  Filled 2021-03-08 (×2): qty 2

## 2021-03-08 MED ORDER — NICOTINE 14 MG/24HR TD PT24
14.0000 mg | MEDICATED_PATCH | Freq: Every day | TRANSDERMAL | Status: DC
  Administered 2021-03-08 – 2021-03-09 (×2): 14 mg via TRANSDERMAL
  Filled 2021-03-08 (×3): qty 1

## 2021-03-08 MED ORDER — LISINOPRIL 5 MG PO TABS
10.0000 mg | ORAL_TABLET | Freq: Every day | ORAL | Status: DC
  Administered 2021-03-08 – 2021-03-10 (×3): 10 mg via ORAL
  Filled 2021-03-08 (×3): qty 2

## 2021-03-08 MED ORDER — ESCITALOPRAM OXALATE 10 MG PO TABS
20.0000 mg | ORAL_TABLET | Freq: Every day | ORAL | Status: DC
  Administered 2021-03-08 – 2021-03-10 (×3): 20 mg via ORAL
  Filled 2021-03-08 (×3): qty 2

## 2021-03-08 NOTE — BHH Group Notes (Signed)
LCSW Group Therapy Note   03/08/2021 1:54 PM  Type of Therapy and Topic:  Group Therapy:  Overcoming Obstacles   Participation Level:  Active   Description of Group:    In this group patients will be encouraged to explore what they see as obstacles to their own wellness and recovery. They will be guided to discuss their thoughts, feelings, and behaviors related to these obstacles. The group will process together ways to cope with barriers, with attention given to specific choices patients can make. Each patient will be challenged to identify changes they are motivated to make in order to overcome their obstacles. This group will be process-oriented, with patients participating in exploration of their own experiences as well as giving and receiving support and challenge from other group members.   Therapeutic Goals: Patient will identify personal and current obstacles as they relate to admission. Patient will identify barriers that currently interfere with their wellness or overcoming obstacles.  Patient will identify feelings, thought process and behaviors related to these barriers. Patient will identify two changes they are willing to make to overcome these obstacles:      Summary of Patient Progress Patient was present in group. Patient was an active participant in group.  Patient shared that "parenting" has been an obstacle that she has faced recently. She reports that communication skills has been a barrier to overcoming this obstacle. She struggled with identifying coping skills and steps to overcome her obstacle. She did report that help herself stay on track she has to make a list of reach out to her girlfriends.  Therapeutic Modalities:   Cognitive Behavioral Therapy Solution Focused Therapy Motivational Interviewing Relapse Prevention Therapy  Penni Homans, MSW, LCSW 03/08/2021 1:54 PM

## 2021-03-08 NOTE — Progress Notes (Signed)
  ADMISSION DAR NOTE:   Pt transferred from ED under involuntary status. Alert and oriented by 3. Pt observed with sullen affect, logical speech and fair eye contact.  Reports worsening of anxiety, depression and Passive SI. Verbally contracted for safety. Denies HI/A/VH.   Patients report taking an intentional overdose o of hydrocodone and Lexapro. Per Patient " I took 6-7 hydrocodone and a handful of Lexapro, my son is a witch and and my boyfriend has been stressing me. I have been taking medication for anxiety since I started dating my boyfriend"  I cannot take the pressure anymore"   Patient reports poor sleep and anxiety is always above 8/10 when she is at home. She has been drinking a lot of beer about 8-9 beers a day and about 6 shots of liquor. Stated this week she drank once before taking an overdose only because she had been to the ED last Saturday.   Emotional support and availability offered to Patient as needed. Skin assessment done and belongings searched per protocol. Items deemed contraband secured in locker. Unit orientation and routine discussed, Care Plan reviewed as well and Patient verbalized understanding. Fluids and Food offered, tolerated well. Q15 minutes safety checks initiated without self harm gestures.
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
## 2021-03-08 NOTE — Progress Notes (Signed)
Recreation Therapy Notes  Date: 03/08/2021  Time: 9:30am  Location: Craft room   Behavioral response: Appropriate   Intervention Topic: Self-care   Discussion/Intervention:  Group content today was focused on Self-Care. The group defined self-care and some positive ways they care for themselves. Individuals expressed ways and reasons why they neglected any self-care in the past. Patients described ways to improve self-care in the future. The group explained what could happen if they did not do any self-care activities at all. The group participated in the intervention "self-care assessment" where they had a chance to discover some of their weaknesses and strengths in self- care. Patient came up with a self-care plan to improve themselves in the future.  Clinical Observations/Feedback: Patient came to group explained that self-care is making sure you get enough sleep and eat right. She identified praying as a way she cares for herself. Participant expressed that taking care of others is what stops her from participating in proper self-care. Individual was social with peers and staff while participating in the intervention.  Olivia Melendez LRT/CTRS          Olivia Melendez 03/08/2021 12:38 PM
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
## 2021-03-08 NOTE — Plan of Care (Signed)
Patient during assessments this morning observed up in the day room watching tv just prior to our engagement. Pt. During our engagement was appreciably dysphoric in her mood, expressing she is frustrated she is on the unit, and that she feels she does not need to be here. Her affect was congruent to her mood with brief eye contact and an intent interpersonal style. Her orientation was appreciably grossly intact. She expressed passive suicidal ideations, but endorsed an ability to remain safe on the unit. She denied HI/AVH.   Patient has been complaint with medications and unit procedures thus far. Pt. Has been observed eating good thus far, observed getting up for meals to eat in the day room with peers. Pt. Has been able to remain safe on the unit thus far. Pt. Has not been a behavioral concern thus far. Pt. Outside of meals has been observed watching tv and or resting in her room. Pt. Given PRN medications for comfort.   Q x 15 minute observation checks in place/maintained for safety. Patient is provided with education throughout shift when appropriate and able.  Patient is given/offered medications per orders. Patient is encouraged to attend groups, participate in unit activities and continue with plan of care. Pt. Chart and plans of care reviewed. Pt. Given support and encouragement when appropriate and able.      Problem: Education: Goal: Emotional status will improve Outcome: Progressing Note: Patient affect brighter as the day has progressed. Patient endorsed mood slightly endorsed as improved as the day has progressed.    Problem: Activity: Goal: Interest or engagement in activities will improve Outcome: Progressing Note: Pt. Has been observed attending unit activities and groups thus far.    Problem: Health Behavior/Discharge Planning: Goal: Compliance with treatment plan for underlying cause of condition will improve Outcome: Progressing Note: Pt. Has been complaint with medications  and unit procedures.    Problem: Safety: Goal: Periods of time without injury will increase Outcome: Progressing Note: Pt. Has been able to remain safe on the unit thus far. Pt. Able to endorse ability to remain safe on the unit and not harm self.    Problem: Coping: Goal: Will verbalize feelings Outcome: Progressing Note: Pt. Has been able to verbalize feelings.

## 2021-03-08 NOTE — Tx Team (Addendum)
Interdisciplinary Treatment and Diagnostic Plan Update  03/08/2021 Time of Session: 9:00AM Olivia Melendez MRN: [id number]  Principal Diagnosis: <principal problem not specified>  Secondary Diagnoses: Active Problems:   MDD (major depressive disorder)   Current Medications:  Current Facility-Administered Medications  Medication Dose Route Frequency Provider Last Rate Last Admin   acetaminophen (TYLENOL) tablet 650 mg  650 mg Oral Q6H PRN Derrill Center, NP       alum & mag hydroxide-simeth (MAALOX/MYLANTA) 200-200-20 MG/5ML suspension 30 mL  30 mL Oral Q4H PRN Derrill Center, NP       magnesium hydroxide (MILK OF MAGNESIA) suspension 30 mL  30 mL Oral Daily PRN Derrill Center, NP       traZODone (DESYREL) tablet 50 mg  50 mg Oral QHS PRN Derrill Center, NP       PTA Medications: Medications Prior to Admission  Medication Sig Dispense Refill Last Dose   escitalopram (LEXAPRO) 10 MG tablet Take 2 tablets by mouth daily.      HYDROCODONE-ACETAMINOPHEN PO Take by mouth.      Ibuprofen-diphenhydrAMINE HCl (IBUPROFEN PM) 200-25 MG CAPS Take 2 tablets by mouth at bedtime.      lisinopril (ZESTRIL) 10 MG tablet Take 1 tablet by mouth daily.      traZODone (DESYREL) 50 MG tablet Take 50 mg by mouth at bedtime.       Patient Stressors: Marital or family conflict  Patient Strengths: Ability for insight Active sense of humor Average or above average intelligence Capable of independent living Communication skills General fund of knowledge  Treatment Modalities: Medication Management, Group therapy, Case management,  1 to 1 session with clinician, Psychoeducation, Recreational therapy.   Physician Treatment Plan for Primary Diagnosis: <principal problem not specified> Long Term Goal(s):     Short Term Goals:    Medication Management: Evaluate patient's response, side effects, and tolerance of medication regimen.  Therapeutic Interventions: 1 to 1 sessions, Unit Group sessions  and Medication administration.  Evaluation of Outcomes: Not Met  Physician Treatment Plan for Secondary Diagnosis: Active Problems:   MDD (major depressive disorder)  Long Term Goal(s):     Short Term Goals:       Medication Management: Evaluate patient's response, side effects, and tolerance of medication regimen.  Therapeutic Interventions: 1 to 1 sessions, Unit Group sessions and Medication administration.  Evaluation of Outcomes: Not Met   RN Treatment Plan for Primary Diagnosis: <principal problem not specified> Long Term Goal(s): Knowledge of disease and therapeutic regimen to maintain health will improve  Short Term Goals: Ability to demonstrate self-control, Ability to participate in decision making will improve, Ability to verbalize feelings will improve, Ability to disclose and discuss suicidal ideas, and Ability to identify and develop effective coping behaviors will improve  Medication Management: RN will administer medications as ordered by provider, will assess and evaluate patient's response and provide education to patient for prescribed medication. RN will report any adverse and/or side effects to prescribing provider.  Therapeutic Interventions: 1 on 1 counseling sessions, Psychoeducation, Medication administration, Evaluate responses to treatment, Monitor vital signs and CBGs as ordered, Perform/monitor CIWA, COWS, AIMS and Fall Risk screenings as ordered, Perform wound care treatments as ordered.  Evaluation of Outcomes: Not Met   LCSW Treatment Plan for Primary Diagnosis: <principal problem not specified> Long Term Goal(s): Safe transition to appropriate next level of care at discharge, Engage patient in therapeutic group addressing interpersonal concerns.  Short Term Goals: Engage patient in aftercare planning with referrals  and resources, Increase social support, Increase ability to appropriately verbalize feelings, Increase emotional regulation, Facilitate  acceptance of mental health diagnosis and concerns, and Increase skills for wellness and recovery  Therapeutic Interventions: Assess for all discharge needs, 1 to 1 time with Social worker, Explore available resources and support systems, Assess for adequacy in community support network, Educate family and significant other(s) on suicide prevention, Complete Psychosocial Assessment, Interpersonal group therapy.  Evaluation of Outcomes: Not Met   Progress in Treatment: Attending groups: No. Participating in groups: No. Taking medication as prescribed: Yes. Toleration medication: Yes. Family/Significant other contact made: Yes, individual(s) contacted:  once permission is given. Patient understands diagnosis: Yes. Discussing patient identified problems/goals with staff: Yes. Medical problems stabilized or resolved: Yes. Denies suicidal/homicidal ideation: Yes. Issues/concerns per patient self-inventory: No. Other: none  New problem(s) identified: No, Describe:  none  New Short Term/Long Term Goal(s):  medication management for mood stabilization; elimination of SI thoughts; development of comprehensive mental wellness/sobriety plan.   Patient Goals:  "I don't know. I don't think I belong here.  I want to learn different ways of dealing with stress."  Discharge Plan or Barriers: CSW will assist patient in developing appropriate discharge plans.   Reason for Continuation of Hospitalization: Anxiety Depression Medication stabilization  Estimated Length of Stay:  1-7 days  Recreational Therapy: Patient Stressors: N/A  Patient Goal: Patient will engage in groups without prompting or encouragement from LRT x3 group sessions within 5 recreation therapy group sessions  Attendees: Patient: Olivia Melendez  03/08/2021 9:32 AM  Physician: Waldon Merl, NP and Dr. Domingo Cocking, MD   03/08/2021 9:32 AM  Nursing: West Pugh, RN  03/08/2021 9:32 AM  RN Care Manager: 03/08/2021 9:32 AM   Social Worker: Assunta Curtis, MSW, LCSW  03/08/2021 9:32 AM  Recreational Therapist: Devin Going, LRT  03/08/2021 9:32 AM  Other: Paulla Dolly, MSW, [place], [place]liss [place]  03/08/2021 9:32 AM  Other: Kiva Martinique, MSW, [place]  03/08/2021 9:32 AM  Other: 03/08/2021 9:32 AM    Scribe for Treatment Team: Rozann Lesches, LCSW 03/08/2021 9:32 AM

## 2021-03-08 NOTE — Progress Notes (Signed)
Recreation Therapy Notes  INPATIENT RECREATION TR PLAN  Patient Details Name: Olivia Melendez MRN: [id number] DOB: 1981-01-30 Today's Date: 03/08/2021  Rec Therapy Plan Is patient appropriate for Therapeutic Recreation?: Yes Treatment times per week: at least 3 Estimated Length of Stay: 5-7 days TR Treatment/Interventions: Group participation (Comment)  Discharge Criteria Pt will be discharged from therapy if:: Discharged Treatment plan/goals/alternatives discussed and agreed upon by:: Patient/family  Discharge Summary      [place] 03/08/2021, 3:21 PM
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
## 2021-03-09 DIAGNOSIS — F332 Major depressive disorder, recurrent severe without psychotic features: Secondary | ICD-10-CM | POA: Diagnosis not present

## 2021-03-09 LAB — BASIC METABOLIC PANEL
Anion gap: 7 (ref 5–15)
BUN: 10 mg/dL (ref 6–20)
CO2: 27 mmol/L (ref 22–32)
Calcium: 9 mg/dL (ref 8.9–10.3)
Chloride: 105 mmol/L (ref 98–111)
Creatinine, Ser: 0.63 mg/dL (ref 0.44–1.00)
GFR, Estimated: >60 mL/min (ref >60)
Glucose, Bld: 99 mg/dL (ref 70–99)
Potassium: 4 mmol/L (ref 3.5–5.1)
Sodium: 139 mmol/L (ref 135–145)

## 2021-03-09 NOTE — Plan of Care (Signed)
  Problem: Education: Goal: Knowledge of [facility] Education information/materials will improve Outcome: Progressing Goal: Emotional status will improve Outcome: Progressing Goal: Mental status will improve Outcome: Progressing Goal: Verbalization of understanding the information provided will improve Outcome: Progressing   Problem: Activity: Goal: Interest or engagement in activities will improve Outcome: Progressing Goal: Sleeping patterns will improve Outcome: Progressing   Problem: Coping: Goal: Ability to verbalize frustrations and anger appropriately will improve Outcome: Progressing Goal: Ability to demonstrate self-control will improve Outcome: Progressing   Problem: Health Behavior/Discharge Planning: Goal: Identification of resources available to assist in meeting health care needs will improve Outcome: Progressing Goal: Compliance with treatment plan for underlying cause of condition will improve Outcome: Progressing   Problem: Physical Regulation: Goal: Ability to maintain clinical measurements within normal limits will improve Outcome: Progressing   Problem: Safety: Goal: Periods of time without injury will increase Outcome: Progressing   Problem: Education: Goal: Utilization of techniques to improve thought processes will improve Outcome: Progressing Goal: Knowledge of the prescribed therapeutic regimen will improve Outcome: Progressing   Problem: Activity: Goal: Interest or engagement in leisure activities will improve Outcome: Progressing Goal: Imbalance in normal sleep/wake cycle will improve Outcome: Progressing   Problem: Coping: Goal: Coping ability will improve Outcome: Progressing Goal: Will verbalize feelings Outcome: Progressing   Problem: Health Behavior/Discharge Planning: Goal: Ability to make decisions will improve Outcome: Progressing Goal: Compliance with therapeutic regimen will improve Outcome: Progressing    Problem: Role Relationship: Goal: Will demonstrate positive changes in social behaviors and relationships Outcome: Progressing   Problem: Safety: Goal: Ability to disclose and discuss suicidal ideas will improve Outcome: Progressing Goal: Ability to identify and utilize support systems that promote safety will improve Outcome: Progressing   Problem: Self-Concept: Goal: Will verbalize positive feelings about self Outcome: Progressing Goal: Level of anxiety will decrease Outcome: Progressing   

## 2021-03-09 NOTE — [facility] Suicide Risk Assessment (Signed)
[facility] INPATIENT:  Family/Significant Other Suicide Prevention Education  Suicide Prevention Education:  Contact Attempts: Joaquim Lai, mother, [phone], has been identified by the patient as the family member/significant other with whom the patient will be residing, and identified as the person(s) who will aid the patient in the event of a mental health crisis.  With written consent from the patient, two attempts were made to provide suicide prevention education, prior to and/or following the patient's discharge.  We were unsuccessful in providing suicide prevention education.  A suicide education pamphlet was given to the patient to share with family/significant other.  Date and time of first attempt:03/09/2021/1:37PM Date and time of second attempt:Second attempt is needed.   CSW left HIPAA compliant voicemail.  Harden Mo 03/09/2021, 1:36 PM

## 2021-03-09 NOTE — [facility] Group Notes (Signed)
LCSW Group Therapy Note     03/09/2021 2:44 PM     Type of Therapy/Topic:  Group Therapy:  Feelings about Diagnosis     Participation Level:  Active     Description of Group:   This group will allow patients to explore their thoughts and feelings about diagnoses they have received. Patients will be guided to explore their level of understanding and acceptance of these diagnoses. Facilitator will encourage patients to process their thoughts and feelings about the reactions of others to their diagnosis and will guide patients in identifying ways to discuss their diagnosis with significant others in their lives. This group will be process-oriented, with patients participating in exploration of their own experiences, giving and receiving support, and processing challenge from other group members.     Therapeutic Goals:  1.    Patient will demonstrate understanding of diagnosis as evidenced by identifying two or more symptoms of the disorder  2.    Patient will be able to express two feelings regarding the diagnosis  3.    Patient will demonstrate their ability to communicate their needs through discussion and/or role play     Summary of Patient Progress: Patient was present for the entirety of the group session. Patient was an active listener and participated in the topic of discussion, provided helpful advice to others, and added nuance to topic of conversation. She stated that she had a medical condition and when she received her diagnosis, she felt relieved. She also said that it can be scary to receive a diagnosis but felt that her medical and mental health diagnoses were no different. She said she wants to do more self-care and put herself first for once.   Therapeutic Modalities:   Cognitive Behavioral Therapy  Brief Therapy  Feelings Identification    Poet Hineman [place], MSW, LCSW-A  03/09/2021 2:44 PM

## 2021-03-09 NOTE — Progress Notes (Signed)
Patient calm and compliant during assessment denying SI/HI/AVH. Patient observed interacting appropriately with staff and peers on the unit. Patient compliant with medication administration per MD orders. Patient given education, support, and encouragement to be active in her treatment plan. Patient being monitored Q 15 minutes for safety per unit protocol. Pt remains safe on the unit.  

## 2021-03-09 NOTE — Plan of Care (Signed)
Pt denies depression, anxiety, SI, HI and AVH. Pt was educated on care plan and verbalizes understanding. Torrie Mayers RN Problem: Education: Goal: Knowledge of [facility] Education information/materials will improve 03/09/2021 1006 by Chalmers Cater, RN Outcome: Progressing 03/09/2021 0943 by Chalmers Cater, RN Outcome: Progressing Goal: Emotional status will improve 03/09/2021 1006 by Chalmers Cater, RN Outcome: Progressing 03/09/2021 0943 by Chalmers Cater, RN Outcome: Progressing Goal: Mental status will improve 03/09/2021 1006 by Chalmers Cater, RN Outcome: Progressing 03/09/2021 0943 by Chalmers Cater, RN Outcome: Progressing Goal: Verbalization of understanding the information provided will improve 03/09/2021 1006 by Chalmers Cater, RN Outcome: Progressing 03/09/2021 0943 by Chalmers Cater, RN Outcome: Progressing   Problem: Coping: Goal: Ability to verbalize frustrations and anger appropriately will improve 03/09/2021 1006 by Chalmers Cater, RN Outcome: Progressing 03/09/2021 0943 by Chalmers Cater, RN Outcome: Progressing Goal: Ability to demonstrate self-control will improve 03/09/2021 1006 by Chalmers Cater, RN Outcome: Progressing 03/09/2021 0943 by Chalmers Cater, RN Outcome: Progressing   Problem: Health Behavior/Discharge Planning: Goal: Identification of resources available to assist in meeting health care needs will improve 03/09/2021 1006 by Chalmers Cater, RN Outcome: Progressing 03/09/2021 0943 by Chalmers Cater, RN Outcome: Progressing Goal: Compliance with treatment plan for underlying cause of condition will improve 03/09/2021 1006 by Chalmers Cater, RN Outcome: Progressing 03/09/2021 0943 by Chalmers Cater, RN Outcome: Progressing   Problem: Physical Regulation: Goal: Ability to maintain clinical measurements within normal limits will improve 03/09/2021 1006 by Chalmers Cater, RN Outcome: Progressing 03/09/2021 0943 by Chalmers Cater, RN Outcome: Progressing   Problem: Safety: Goal: Periods of time without injury will increase 03/09/2021 1006 by Chalmers Cater, RN Outcome: Progressing 03/09/2021 0943 by Chalmers Cater, RN Outcome: Progressing   Problem: Education: Goal: Utilization of techniques to improve thought processes will improve 03/09/2021 1006 by Chalmers Cater, RN Outcome: Progressing 03/09/2021 0943 by Chalmers Cater, RN Outcome: Progressing Goal: Knowledge of the prescribed therapeutic regimen will improve 03/09/2021 1006 by Chalmers Cater, RN Outcome: Progressing 03/09/2021 0943 by Chalmers Cater, RN Outcome: Progressing

## 2021-03-09 NOTE — Plan of Care (Signed)
Pt denies depression, anxiety, SI, HI and AVH. Pt was educated on care plan and verbalizes understanding. Torrie Mayers RN Problem: Education: Goal: Knowledge of [facility] Education information/materials will improve Outcome: Progressing Goal: Emotional status will improve Outcome: Progressing Goal: Mental status will improve Outcome: Progressing Goal: Verbalization of understanding the information provided will improve Outcome: Progressing   Problem: Activity: Goal: Interest or engagement in activities will improve Outcome: Progressing Goal: Sleeping patterns will improve Outcome: Progressing   Problem: Coping: Goal: Ability to verbalize frustrations and anger appropriately will improve Outcome: Progressing Goal: Ability to demonstrate self-control will improve Outcome: Progressing   Problem: Health Behavior/Discharge Planning: Goal: Identification of resources available to assist in meeting health care needs will improve Outcome: Progressing Goal: Compliance with treatment plan for underlying cause of condition will improve Outcome: Progressing   Problem: Physical Regulation: Goal: Ability to maintain clinical measurements within normal limits will improve Outcome: Progressing   Problem: Safety: Goal: Periods of time without injury will increase Outcome: Progressing   Problem: Education: Goal: Utilization of techniques to improve thought processes will improve Outcome: Progressing Goal: Knowledge of the prescribed therapeutic regimen will improve Outcome: Progressing   Problem: Activity: Goal: Interest or engagement in leisure activities will improve Outcome: Progressing Goal: Imbalance in normal sleep/wake cycle will improve Outcome: Progressing   Problem: Coping: Goal: Coping ability will improve Outcome: Progressing Goal: Will verbalize feelings Outcome: Progressing   Problem: Health Behavior/Discharge Planning: Goal: Ability to make decisions  will improve Outcome: Progressing Goal: Compliance with therapeutic regimen will improve Outcome: Progressing   Problem: Role Relationship: Goal: Will demonstrate positive changes in social behaviors and relationships Outcome: Progressing   Problem: Safety: Goal: Ability to disclose and discuss suicidal ideas will improve Outcome: Progressing Goal: Ability to identify and utilize support systems that promote safety will improve Outcome: Progressing   Problem: Self-Concept: Goal: Will verbalize positive feelings about self Outcome: Progressing Goal: Level of anxiety will decrease Outcome: Progressing

## 2021-03-09 NOTE — Progress Notes (Signed)
Patient has been pleasant and cooperative. She says she is feeling better. Denies SI. Says she takes two 50 mg Trazodone at bedtime for sleep at home and would like to continue that here

## 2021-03-09 NOTE — Progress Notes (Signed)
[facility] MD Progress Note  03/09/2021 11:28 AM Olivia Melendez  MRN:  [id number]  CC: "This experience made me realize how much my family and friends really do care about me." Subjective:   Olivia Melendez is a 40 year old female with a history of major depressive disorder who was brought to the ED by a friend after patient admitted to taking "a handful" of Lexapro and hydrocodone.  Patient initially denied this as a suicide attempt, but at one point during the ED visit she stated "I just wanted to go to sleep." Patient was seen and interviewed this morning.  Patient had been grieving in her bed.  She sat up and was alert and oriented x4, smiling.  Patient states that she spoke with her father who has given her a lot of support, which surprised her.  She states that she realized that people in her life really did not care about her and she has hope that things will work out.  She states that her boyfriend is moving out of state this week and when she is discharged from the hospital she will be going home for a few days until he is gone.  Patient talks about how she wishes she would have just reached out to her friends or family and that she knows now she needs to do that before she gets so stressed out.  Patient denies suicidal ideation and expresses hope for the future.  Denies homicidal ideation, auditory or visual hallucinations or paranoia.  She is taking medications as prescribed and has had no unsafe behaviors on the unit.  Patient states that she enjoyed the group about "self-care" yesterday.  Sleep and appetite have been adequate.  Per chart review patient is participating in activities on the unit.  Patient identifies a goal of being discharged by tomorrow.  Principal Problem: MDD (major depressive disorder), recurrent severe, without psychosis (HCC) Diagnosis: Principal Problem:   MDD (major depressive disorder), recurrent severe, without psychosis (HCC) Active Problems:   Essential hypertension    Tobacco abuse   Medication overdose  Total Time spent with patient: 15 minutes  Past Psychiatric History:  History of anxiety and depression.  No prior hospitalizations Past Medical History:  Past Medical History:  Diagnosis Date   Anxiety    Chiari malformation type I (HCC) followed at [facility] (neurologist/ oncologist)   06-07-2012  at [facility]  s/p  supocciptal crainectomy chiari decompression   Chiari malformation type I (HCC)    Chronic headaches    intermitant occiptal headaches , hx chiari malformation   Depression    Endometriosis    History of ectopic pregnancy    08/ 2017   treated w/ methotexrate   History of endometriosis    Hypertension    06-06-2016 per pt has taken not medication, diamox, for past several months due to finances no insurance in between jobs/  03-31-2017 per pt has appt w/ pcp to take something else that can be taken while preg.   Hypertension    IIH (idiopathic intracranial hypertension)    per pt dx 2014 --  approx. every 6 months gets spinal tap at [facility] radiology  , last one 08-19-2015   Infertility, female    Nicotine dependence    Pelvic adhesions    Phimosis    left fallopian tube   Polyp of fallopian tube    ostia tubal polyp    Past Surgical History:  Procedure Laterality Date   BLADDER REPAIR  07/30/2018   Procedure: OPEN CYSTOTOMY  REPAIR;  Surgeon: Sebastian Ache, MD;  [facility] OR;  Service: Urology;;   CHROMOPERTUBATION Bilateral 06/06/2016   Procedure: CHROMOPERTUBATION;  Surgeon: Mitchel Honour, DO;  Location: [facility];  Service: Gynecology;  Laterality: Bilateral;   CRANIECTOMY SUBOCCIPITAL W/ CERVICAL LAMINECTOMY / CHIARI  06/07/2012   Decompression w/ resection posterior arch of C1 (chiari malformation Type 1)    CRANIECTOMY SUBOCCIPITAL W/ CERVICAL LAMINECTOMY / CHIARI  2013   DX LAPAROSCOPY W/  ABLATION ENDOMETRIOSIS  2001 and 2006   EXTERNAL FIXATION REMOVAL Bilateral 08/02/2018   Procedure: REMOVAL EXTERNAL  FIXATION PELVIS;  Surgeon: Roby Lofts, MD;  Location: [facility] OR;  Service: Orthopedics;  Laterality: Bilateral;   HARDWARE REMOVAL Bilateral 04/12/2019   Procedure: HARDWARE REMOVAL PELVIS;  Surgeon: Roby Lofts, MD;  Location: [facility] OR;  Service: Orthopedics;  Laterality: Bilateral;   HYSTEROSCOPY     LAPAROSCOPY N/A 06/06/2016   Procedure: LAPAROSCOPY DIAGNOSTIC with possible removal of endometriosis;  Surgeon: Mitchel Honour, DO;  Location: [facility];  Service: Gynecology;  Laterality: N/A;   LAPAROSCOPY Bilateral 04/11/2017   Procedure: hysteroscopy lysis of adhesions, incision of uterine septum, suction D and C, and bilateral proximal tubal recannulization LAPAROSCOPy, excision of endometriosis and chromotubation;  Surgeon: Fermin Schwab, MD;  Location: [facility];  Service: Gynecology;  Laterality: Bilateral;   LAPAROSCOPY     for endometriosis    ORIF PELVIC FRACTURE N/A 08/02/2018   Procedure: OPEN REDUCTION INTERNAL FIXATION (ORIF) PELVIC FRACTURE;  Surgeon: Roby Lofts, MD;  Location: [facility] OR;  Service: Orthopedics;  Laterality: N/A;   ORIF PELVIC FRACTURE WITH PERCUTANEOUS SCREWS N/A 07/30/2018   Procedure: I&D and EXTERNAL FIXATION OF PELVIS;  Surgeon: Roby Lofts, MD;  Location: [facility] OR;  Service: Orthopedics;  Laterality: N/A;   SACRO-ILIAC PINNING Bilateral 08/02/2018   Procedure: SACRO-ILIAC PINNING;  Surgeon: Roby Lofts, MD;  Location: [facility] OR;  Service: Orthopedics;  Laterality: Bilateral;   SPINAL PUNCTURE LUMBAR DIAG ([facility] HX)  last one at [facility] 08-19-2015   for chiari occiptal headaches   TONSILLECTOMY  child   WISDOM TOOTH EXTRACTION  1994   Family History:  Family History  Problem Relation Age of Onset   Multiple sclerosis Mother    Diabetes Mother    Heart attack Mother    High blood pressure Father    Family Psychiatric  History: Denies Social History:  Social History   Substance and Sexual Activity  Alcohol Use Yes    Comment: 3-4 drinks a week     Social History   Substance and Sexual Activity  Drug Use Never    Social History   Socioeconomic History   Marital status: Married    Spouse name: Not on file   Number of children: Not on file   Years of education: Not on file   Highest education level: Not on file  Occupational History   Not on file  Tobacco Use   Smoking status: Every Day    Packs/day: 1.00    Years: 17.00    Pack years: 17.00    Types: Cigarettes   Smokeless tobacco: Never  Vaping Use   Vaping Use: Never used  Substance and Sexual Activity   Alcohol use: Yes    Comment: 3-4 drinks a week   Drug use: Never   Sexual activity: Yes    Birth control/protection: None  Other Topics Concern   Not on file  Social History Narrative   ** Merged  History Encounter **       Social Determinants of Health   Financial Resource Strain: Not on file  Food Insecurity: Not on file  Transportation Needs: Not on file  Physical Activity: Not on file  Stress: Not on file  Social Connections: Not on file   Additional Social History:        Sleep: Good  Appetite:  Good  Current Medications: Current Facility-Administered Medications  Medication Dose Route Frequency Provider Last Rate Last Admin   acetaminophen (TYLENOL) tablet 650 mg  650 mg Oral Q6H PRN Oneta RackLewis, Tanika N, NP   650 mg at 03/08/21 1109   alum & mag hydroxide-simeth (MAALOX/MYLANTA) 200-200-20 MG/5ML suspension 30 mL  30 mL Oral Q4H PRN Oneta RackLewis, Tanika N, NP       buPROPion (WELLBUTRIN XL) 24 hr tablet 150 mg  150 mg Oral Daily Les PouFreeman, Megan M, MD   150 mg at 03/09/21 0801   escitalopram (LEXAPRO) tablet 20 mg  20 mg Oral Daily Les PouFreeman, Megan M, MD   20 mg at 03/09/21 0800   lisinopril (ZESTRIL) tablet 10 mg  10 mg Oral Daily Jesse SansFreeman, Megan M, MD   10 mg at 03/09/21 0800   magnesium hydroxide (MILK OF MAGNESIA) suspension 30 mL  30 mL Oral Daily PRN Oneta RackLewis, Tanika N, NP       nicotine (NICODERM CQ - dosed in mg/24  hours) patch 14 mg  14 mg Transdermal Daily Gabriel CirriBarthold, Antwone Capozzoli F, NP   14 mg at 03/09/21 16100803   nicotine polacrilex (NICORETTE) gum 2 mg  2 mg Oral PRN Vanetta MuldersBarthold, Talitha Dicarlo F, NP   2 mg at 03/08/21 1248   traZODone (DESYREL) tablet 50 mg  50 mg Oral QHS PRN Oneta RackLewis, Tanika N, NP   50 mg at 03/08/21 2102    Lab Results:  Results for orders placed or performed during the hospital encounter of 03/07/21 (from the past 48 hour(s))  TSH     Status: None   Collection Time: 03/08/21  6:28 AM  Result Value Ref Range   TSH 2.665 0.350 - 4.500 uIU/mL    Comment: Performed by a 3rd Generation assay with a functional sensitivity of <=0.01 uIU/mL. Performed at [facility], [address] [place], [place] [address]   Basic metabolic panel     Status: None   Collection Time: 03/09/21  7:14 AM  Result Value Ref Range   Sodium 139 135 - 145 mmol/L   Potassium 4.0 3.5 - 5.1 mmol/L   Chloride 105 98 - 111 mmol/L   CO2 27 22 - 32 mmol/L   Glucose, Bld 99 70 - 99 mg/dL    Comment: Glucose reference range applies only to samples taken after fasting for at least 8 hours.   BUN 10 6 - 20 mg/dL   Creatinine, Ser 4.090.63 0.44 - 1.00 mg/dL   Calcium 9.0 8.9 - 81.110.3 mg/dL   GFR, Estimated >91>60 >47>60 mL/min    Comment: (NOTE) Calculated using the CKD-EPI Creatinine Equation (2021)    Anion gap 7 5 - 15    Comment: Performed at [facility], [address] [place], [place] [address]    Blood Alcohol level:  Lab Results  Component Value Date   ETH 126 (H) 03/06/2021   ETH 190 (H) 02/28/2021    Metabolic Disorder Labs: No results found for: HGBA1C, MPG No results found for: PROLACTIN Lab Results  Component Value Date   TRIG 113 08/01/2018    Physical Findings:  AIMS:  , ,  ,  ,    CIWA:  CIWA-Ar Total: 3 COWS:     Musculoskeletal: Strength & Muscle Tone: within normal limits Gait & Station: normal Patient leans: N/A  Psychiatric Specialty Exam:  Presentation  General Appearance:  Appropriate for Environment  Eye Contact:Good  Speech:Clear and Coherent  Speech Volume:Normal  Handedness:Right   Mood and Affect  Mood:Euthymic  Affect:Congruent   Thought Process  Thought Processes:Coherent  Descriptions of Associations:Intact  Orientation:Full (Time, Place and Person)  Thought Content:WDL  History of Schizophrenia/Schizoaffective disorder:No  Duration of Psychotic Symptoms:No data recorded Hallucinations:Hallucinations: None (Denies)  Ideas of Reference:None (Denies)  Suicidal Thoughts:Suicidal Thoughts: No (Denies)  Homicidal Thoughts:Homicidal Thoughts: No (Denies)   Sensorium  Memory:Immediate Good; Recent Good; Remote Good  Judgment:Good (While hospitalized)  Insight:Good   Executive Functions  Concentration:Good  Attention Span:Good  Recall:Good  Fund of Knowledge:Good  Language:Good   Psychomotor Activity  Psychomotor Activity:Psychomotor Activity: Normal   Assets  Assets:Communication Skills; Desire for Improvement; Housing; Health and safety inspector; Leisure Time; Physical Health; Resilience; Social Support   Sleep  Sleep:Sleep: Good Number of Hours of Sleep: 7.5    Physical Exam: Physical Exam Vitals and nursing note reviewed.  HENT:     Head: Normocephalic.     Nose: Nose normal.  Eyes:     General:        Right eye: No discharge.        Left eye: No discharge.  Cardiovascular:     Rate and Rhythm: Normal rate.  Pulmonary:     Effort: Pulmonary effort is normal.  Musculoskeletal:        General: Normal range of motion.     Cervical back: Normal range of motion.  Skin:    General: Skin is warm and dry.  Neurological:     Mental Status: She is alert and oriented to person, place, and time.  Psychiatric:        Mood and Affect: Mood normal.   Review of Systems  Psychiatric/Behavioral:  Positive for depression (Improving). Negative for hallucinations, memory loss, substance abuse and  suicidal ideas. The patient is not nervous/anxious and does not have insomnia.   All other systems reviewed and are negative. Blood pressure 115/80, pulse 76, temperature 97.8 F (36.6 C), temperature source Oral, resp. rate 17, height 5\' 4"  (1.626 m), weight 83 kg, SpO2 100 %. Body mass index is 31.41 kg/m.   Treatment Plan Summary: Daily contact with patient to assess and evaluate symptoms and progress in treatment and Medication management  03/09/2021 update: Patient is improving.  Continue with plan.  Consider discharge 03/10/2021. Potassium at normal level (4.0).   Depression/mood stabilization - Continue Wellbutrin XL 24-hour tablet 150 mg daily - Continue Lexapro 20 mg tablet daily  Hypertension - Continue lisinopril tablet 10 mg daily  Nicotine replacement/smoking cessation - Continue NicoDerm CQ 14 mg transdermal daily - Continue Nicorette gum 2 mg p.o. as needed  Hypokalemia, resolved - Klor-Con CR completed.  Potassium 4.0 on 03/09/2021  Insomnia - Continue trazodone tablet 50 mg oral at bedtime as needed   PRN, Other -- Continue Tylenol 650 mg po every 6 hrs prn pain -- Continue MAALOX/MYLANTA 30 mL po every 4 hrs prn indigestion -- Continue Milk of Magnesia 30 mL po daily prn constipation      03/11/2021, NP 03/09/2021, 11:28 AM

## 2021-03-09 NOTE — Progress Notes (Signed)
Pt has been med compliant, calm and cooperative. Pt has attended group. Pt has been isolative at times in her room. She has made several phone calls today. Torrie Mayers RN

## 2021-03-10 DIAGNOSIS — F332 Major depressive disorder, recurrent severe without psychotic features: Secondary | ICD-10-CM | POA: Diagnosis not present

## 2021-03-10 MED ORDER — BUPROPION HCL ER (XL) 150 MG PO TB24: 150.0000 mg | ORAL_TABLET | Freq: Every day | ORAL | 1 refills | Status: DC

## 2021-03-10 MED ORDER — ESCITALOPRAM OXALATE 10 MG PO TABS: 20.0000 mg | ORAL_TABLET | Freq: Every day | ORAL | 1 refills | Status: DC

## 2021-03-10 NOTE — Plan of Care (Signed)
Pt denies depression, anxiety, hopelessness, SI, HI and AVH. Pt was educated on care plan and verbalizes understanding. Torrie Mayers RN Problem: Education: Goal: Knowledge of [place] [facility] Education information/materials will improve Outcome: Adequate for Discharge Goal: Emotional status will improve Outcome: Adequate for Discharge Goal: Mental status will improve Outcome: Adequate for Discharge Goal: Verbalization of understanding the information provided will improve Outcome: Adequate for Discharge   Problem: Activity: Goal: Interest or engagement in activities will improve Outcome: Adequate for Discharge Goal: Sleeping patterns will improve Outcome: Adequate for Discharge   Problem: Coping: Goal: Ability to verbalize frustrations and anger appropriately will improve Outcome: Adequate for Discharge Goal: Ability to demonstrate self-control will improve Outcome: Adequate for Discharge   Problem: Health Behavior/Discharge Planning: Goal: Identification of resources available to assist in meeting health care needs will improve Outcome: Adequate for Discharge Goal: Compliance with treatment plan for underlying cause of condition will improve Outcome: Adequate for Discharge   Problem: Physical Regulation: Goal: Ability to maintain clinical measurements within normal limits will improve Outcome: Adequate for Discharge   Problem: Safety: Goal: Periods of time without injury will increase Outcome: Adequate for Discharge   Problem: Education: Goal: Utilization of techniques to improve thought processes will improve Outcome: Adequate for Discharge Goal: Knowledge of the prescribed therapeutic regimen will improve Outcome: Adequate for Discharge   Problem: Activity: Goal: Interest or engagement in leisure activities will improve Outcome: Adequate for Discharge Goal: Imbalance in normal sleep/wake cycle will improve Outcome: Adequate for Discharge   Problem:  Coping: Goal: Coping ability will improve Outcome: Adequate for Discharge Goal: Will verbalize feelings Outcome: Adequate for Discharge   Problem: Health Behavior/Discharge Planning: Goal: Ability to make decisions will improve Outcome: Adequate for Discharge Goal: Compliance with therapeutic regimen will improve Outcome: Adequate for Discharge   Problem: Role Relationship: Goal: Will demonstrate positive changes in social behaviors and relationships Outcome: Adequate for Discharge   Problem: Safety: Goal: Ability to disclose and discuss suicidal ideas will improve Outcome: Adequate for Discharge Goal: Ability to identify and utilize support systems that promote safety will improve Outcome: Adequate for Discharge   Problem: Self-Concept: Goal: Will verbalize positive feelings about self Outcome: Adequate for Discharge Goal: Level of anxiety will decrease Outcome: Adequate for Discharge

## 2021-03-10 NOTE — Progress Notes (Signed)
Recreation Therapy Notes  INPATIENT RECREATION TR PLAN  Patient Details Name: Olivia Melendez MRN: [id number] DOB: 09/16/1980 Today's Date: 03/10/2021  Rec Therapy Plan Is patient appropriate for Therapeutic Recreation?: Yes Treatment times per week: at least 3 Estimated Length of Stay: 5-7 days TR Treatment/Interventions: Group participation (Comment)  Discharge Criteria Pt will be discharged from therapy if:: Discharged Treatment plan/goals/alternatives discussed and agreed upon by:: Patient/family  Discharge Summary Short term goals set: Patient will identify 3 positive coping skills strategies to use for SI post d/c within 5 recreation therapy group sessions Short term goals met: Adequate for discharge Progress toward goals comments: Groups attended Which groups?: Goal setting, Other (Comment) (Self-care) Reason goals not met: N/A Therapeutic equipment acquired: N/A Reason patient discharged from therapy: Discharge from hospital Pt/family agrees with progress & goals achieved: Yes Date patient discharged from therapy: 03/10/21   Jettie Mannor 03/10/2021, 1:05 PM

## 2021-03-10 NOTE — Progress Notes (Signed)
Pt denies SI, HI and AVH. Pt received belongings, transition record and AVS. Pt was educated on dc plan and verbalizes understanding. Torrie Mayers RN

## 2021-03-10 NOTE — Discharge Summary (Signed)
m Physician Discharge Summary Note  Patient:  Olivia Melendez is an 40 y.o., female MRN:  [id number] DOB:  [birth year] Patient phone:  [phone] (home)  Patient address:   [address] [place] [address],  Total Time spent with patient: 30 minutes  Date of Admission:  03/07/2021 Date of Discharge: 03/10/2021  Reason for Admission:    Olivia Melendez is a 40 year old female with a history of major depressive disorder who was brought to the ED by a friend after patient admitted to taking "a handful" of Lexapro and hydrocodone.  Patient initially denied this as a suicide attempt, but at one point during the ED visit she stated "I just wanted to go to sleep."  Principal Problem: MDD (major depressive disorder), recurrent severe, without psychosis (HCC) Discharge Diagnoses: Principal Problem:   MDD (major depressive disorder), recurrent severe, without psychosis (HCC) Active Problems:   Essential hypertension   Tobacco abuse   Medication overdose   Past Psychiatric History: History of anxiety and depression.  No prior hospitalizations  Past Medical History:  Past Medical History:  Diagnosis Date   Anxiety    Chiari malformation type I (HCC) followed at [facility] (neurologist/ oncologist)   06-07-2012  at [facility]  s/p  supocciptal crainectomy chiari decompression   Chiari malformation type I (HCC)    Chronic headaches    intermitant occiptal headaches , hx chiari malformation   Depression    Endometriosis    History of ectopic pregnancy    08/ 2017   treated w/ methotexrate   History of endometriosis    Hypertension    06-06-2016 per pt has taken not medication, diamox, for past several months due to finances no insurance in between jobs/  03-31-2017 per pt has appt w/ pcp to take something else that can be taken while preg.   Hypertension    IIH (idiopathic intracranial hypertension)    per pt dx 2014 --  approx. every 6 months gets spinal tap at [facility] radiology  , last one  08-19-2015   Infertility, female    Nicotine dependence    Pelvic adhesions    Phimosis    left fallopian tube   Polyp of fallopian tube    ostia tubal polyp    Past Surgical History:  Procedure Laterality Date   BLADDER REPAIR  07/30/2018   Procedure: OPEN CYSTOTOMY REPAIR;  Surgeon: Sebastian Ache, MD;  Location: [facility] OR;  Service: Urology;;   CHROMOPERTUBATION Bilateral 06/06/2016   Procedure: CHROMOPERTUBATION;  Surgeon: Mitchel Honour, DO;  Location: [facility];  Service: Gynecology;  Laterality: Bilateral;   CRANIECTOMY SUBOCCIPITAL W/ CERVICAL LAMINECTOMY / CHIARI  06/07/2012   Decompression w/ resection posterior arch of C1 (chiari malformation Type 1)    CRANIECTOMY SUBOCCIPITAL W/ CERVICAL LAMINECTOMY / CHIARI  2013   DX LAPAROSCOPY W/  ABLATION ENDOMETRIOSIS  2001 and 2006   EXTERNAL FIXATION REMOVAL Bilateral 08/02/2018   Procedure: REMOVAL EXTERNAL FIXATION PELVIS;  Surgeon: Roby Lofts, MD;  Location: [facility] OR;  Service: Orthopedics;  Laterality: Bilateral;   HARDWARE REMOVAL Bilateral 04/12/2019   Procedure: HARDWARE REMOVAL PELVIS;  Surgeon: Roby Lofts, MD;  Location: [facility] OR;  Service: Orthopedics;  Laterality: Bilateral;   HYSTEROSCOPY     LAPAROSCOPY N/A 06/06/2016   Procedure: LAPAROSCOPY DIAGNOSTIC with possible removal of endometriosis;  Surgeon: Mitchel Honour, DO;  Location: [facility];  Service: Gynecology;  Laterality: N/A;   LAPAROSCOPY Bilateral 04/11/2017   Procedure: hysteroscopy lysis of adhesions,  incision of uterine septum, suction D and C, and bilateral proximal tubal recannulization LAPAROSCOPy, excision of endometriosis and chromotubation;  Surgeon: Fermin Schwab, MD;  Location: [facility];  Service: Gynecology;  Laterality: Bilateral;   LAPAROSCOPY     for endometriosis    ORIF PELVIC FRACTURE N/A 08/02/2018   Procedure: OPEN REDUCTION INTERNAL FIXATION (ORIF) PELVIC FRACTURE;  Surgeon: Roby Lofts, MD;  Location: [facility] OR;  Service: Orthopedics;  Laterality: N/A;   ORIF PELVIC FRACTURE WITH PERCUTANEOUS SCREWS N/A 07/30/2018   Procedure: I&D and EXTERNAL FIXATION OF PELVIS;  Surgeon: Roby Lofts, MD;  Location: [facility] OR;  Service: Orthopedics;  Laterality: N/A;   SACRO-ILIAC PINNING Bilateral 08/02/2018   Procedure: SACRO-ILIAC PINNING;  Surgeon: Roby Lofts, MD;  Location: [facility] OR;  Service: Orthopedics;  Laterality: Bilateral;   SPINAL PUNCTURE LUMBAR DIAG ([facility] HX)  last one at [facility] 08-19-2015   for chiari occiptal headaches   TONSILLECTOMY  child   WISDOM TOOTH EXTRACTION  1994   Family History:  Family History  Problem Relation Age of Onset   Multiple sclerosis Mother    Diabetes Mother    Heart attack Mother    High blood pressure Father    Family Psychiatric  History: Denies  Social History:  Social History   Substance and Sexual Activity  Alcohol Use Yes   Comment: 3-4 drinks a week     Social History   Substance and Sexual Activity  Drug Use Never    Social History   Socioeconomic History   Marital status: Married    Spouse name: Not on file   Number of children: Not on file   Years of education: Not on file   Highest education level: Not on file  Occupational History   Not on file  Tobacco Use   Smoking status: Every Day    Packs/day: 1.00    Years: 17.00    Pack years: 17.00    Types: Cigarettes   Smokeless tobacco: Never  Vaping Use   Vaping Use: Never used  Substance and Sexual Activity   Alcohol use: Yes    Comment: 3-4 drinks a week   Drug use: Never   Sexual activity: Yes    Birth control/protection: None  Other Topics Concern   Not on file  Social History Narrative   ** Merged History Encounter **       Social Determinants of Health   Financial Resource Strain: Not on file  Food Insecurity: Not on file  Transportation Needs: Not on file  Physical Activity: Not on file  Stress: Not on file  Social Connections: Not on file     Hospital Course:  After the above admission evaluation, patient was admitted to the [facility] for medication management and treatment for her symptoms of anxiety, depression and suicidal ideation with an intentional overdose of her antidepressant medication. Per patient and chart review, this is her first psychiatric admission. There were no instances of behavior that required restraints, forced medications, or immediate intervention.  There were no instances of self-harming behavior. Patient remained safe on the unit.  Patient remained cooperative, participated in group sessions and interacted with staff and patients appropriately.  Her home medications of Lexapro 20 mg daily was restarted on the unit.  Trazodone 50 mg at bedtime as needed for sleep was also restarted.  Over the course of her hospitalization, patient expressed significant stress that she has at home that she believes led her to this  admission.  She states that she would like to engage in psychotherapy, and options were given to her to make appointments.  Patient states that her symptoms of anxiety and depression have improved. She denies thoughts of self-harm and suicidal ideation. She expresses that she has learned better coping strategies to alleviate her symptoms. Patient expresses readiness for discharge and future-oriented thinking. Patient encouraged to keep all psychiatric appointments and continue with follow-up.      Physical Findings: AIMS:  , ,  ,  ,    CIWA:  CIWA-Ar Total: 3 COWS:     Musculoskeletal: Strength & Muscle Tone: within normal limits Gait & Station: normal Patient leans: N/A   Psychiatric Specialty Exam:  SEE MD'S DISCHARGE SRA  Presentation  General Appearance: Appropriate for Environment  Eye Contact:Good  Speech:Clear and Coherent  Speech Volume:Normal  Handedness:Right   Mood and Affect  Mood:Euthymic  Affect:Congruent   Thought Process  Thought Processes:Coherent  Descriptions of  Associations:Intact  Orientation:Full (Time, Place and Person)  Thought Content:WDL  History of Schizophrenia/Schizoaffective disorder:No  Duration of Psychotic Symptoms:No data recorded Hallucinations:Hallucinations: None (Denies)  Ideas of Reference:None (Denies)  Suicidal Thoughts:Suicidal Thoughts: No (Denies)  Homicidal Thoughts:Homicidal Thoughts: No (Denies)   Sensorium  Memory:Immediate Good; Recent Good; Remote Good  Judgment:Good (While hospitalized)  Insight:Good   Executive Functions  Concentration:Good  Attention Span:Good  Recall:Good  Fund of Knowledge:Good  Language:Good   Psychomotor Activity  Psychomotor Activity:Psychomotor Activity: Normal   Assets  Assets:Communication Skills; Desire for Improvement; Housing; Health and safety inspector; Leisure Time; Physical Health; Resilience; Social Support   Sleep  Sleep:Sleep: Good Number of Hours of Sleep: 7.5    Physical Exam: Physical Exam Vitals and nursing note reviewed.  HENT:     Head: Normocephalic.     Nose: No congestion or rhinorrhea.  Eyes:     General:        Right eye: No discharge.        Left eye: No discharge.  Cardiovascular:     Rate and Rhythm: Normal rate.  Musculoskeletal:        General: Normal range of motion.     Cervical back: Normal range of motion.  Skin:    General: Skin is warm and dry.  Neurological:     Mental Status: She is alert and oriented to person, place, and time.  Psychiatric:        Mood and Affect: Mood normal.   Review of Systems  Psychiatric/Behavioral:  Positive for depression (improving, stable for discharge). Negative for hallucinations, memory loss, substance abuse and suicidal ideas. The patient is not nervous/anxious and does not have insomnia.   All other systems reviewed and are negative. Blood pressure 120/83, pulse 87, temperature 98.4 F (36.9 C), temperature source Oral, resp. rate 17, height 5\' 4"  (1.626 m), weight 83 kg,  SpO2 99 %. Body mass index is 31.41 kg/m.   Social History   Tobacco Use  Smoking Status Every Day   Packs/day: 1.00   Years: 17.00   Pack years: 17.00   Types: Cigarettes  Smokeless Tobacco Never   Tobacco Cessation: Prescription not provided because FDA approved smoking cessation product that is available without a prescription was recommended.     Blood Alcohol level:  Lab Results  Component Value Date   ETH 126 (H) 03/06/2021   ETH 190 (H) 02/28/2021    Metabolic Disorder Labs:  No results found for: HGBA1C, MPG No results found for: PROLACTIN Lab Results  Component Value Date  TRIG 113 08/01/2018    See Psychiatric Specialty Exam and Suicide Risk Assessment completed by Attending Physician prior to discharge.  Discharge destination:  Home  Is patient on multiple antipsychotic therapies at discharge:  No   Has Patient had three or more failed trials of antipsychotic monotherapy by history:  No  Recommended Plan for Multiple Antipsychotic Therapies: NA  Discharge Instructions     Diet - low sodium heart healthy   Complete by: As directed    Discharge instructions   Complete by: As directed    Follow up with outpatient provider for all your medical care and psychiatric needs. Keep all appointments.  Increase your activity as tolerated. Adjust your diet as recommended by your outpatient provider.However, a heart healthy diet is recommended overall.  Take all of your medications as prescribed. Report any side effects to your outpatient provider promptly. Refrain from alcohol and illegal drug use while taking medications.  In the case of emergency call 911 or go to the nearest emergency department for evaluation/treatment.      Allergies as of 03/10/2021   No Known Allergies      Medication List     STOP taking these medications    HYDROCODONE-ACETAMINOPHEN PO   Ibuprofen PM 200-25 MG Caps Generic drug: Ibuprofen-diphenhydrAMINE HCl       TAKE  these medications      Indication  buPROPion 150 MG 24 hr tablet Commonly known as: WELLBUTRIN XL Take 1 tablet (150 mg total) by mouth daily. Start taking on: March 11, 2021  Indication: Major Depressive Disorder   escitalopram 10 MG tablet Commonly known as: LEXAPRO Take 2 tablets (20 mg total) by mouth daily.  Indication: Major Depressive Disorder   lisinopril 10 MG tablet Commonly known as: ZESTRIL Take 1 tablet by mouth daily.  Indication: High Blood Pressure Disorder   traZODone 50 MG tablet Commonly known as: DESYREL Take 50 mg by mouth at bedtime.  Indication: Trouble Sleeping        Follow-up Information     Wiliam KeAnnia Leger, MSW, [place], [place]. Schedule an appointment as soon as possible for a visit.   Why: Please call to make an intake appointment once discharged. Contact information: [facility]  [address]  [address] [place], [place] [address] [phone]                Follow-up recommendations: Keep all of your psychiatric appointments.  Follow up with your outpatient provider for all your medical care needs.  You may increase your activity as tolerated and resume your regular diet as recommended by your outpatient provider.    Comments:  Take all of your medications as prescribed. Report any side effects to your outpatient provider promptly. Refrain from alcohol and illegal drug use while taking medications.  In the case of emergency call 911 or go to the nearest emergency department for evaluation/treatment.   Signed: Vanetta MuldersLouise F Khadijatou Borak, NP 03/10/2021, 9:46 AM

## 2021-03-10 NOTE — Progress Notes (Signed)
  [facility] Adult Case Management Discharge Plan :  Will you be returning to the same living situation after discharge:  No. At discharge, do you have transportation home?: Yes,  pt reports that her best friend will provide transportation. Do you have the ability to pay for your medications: Yes,  Medicaid  Release of information consent forms completed and in the chart;  Patient's signature needed at discharge.  Patient to Follow up at:  Follow-up Information     Wiliam Ke, MSW, [place], [place]. Schedule an appointment as soon as possible for a visit.   Why: Please call to make an intake appointment once discharged. Contact information: [facility]  [address]  [address] [place], [place] [address] [phone]        [facility] Follow up.   Why: If you are unable to go to your desired provider you may be able to follow up with this provider.  Thanks! Contact information: [address] [place] [address] [phone]         [facility] [employer] Follow up.   Why: If you are unable to go to your desired provider you may be able to follow up with this provider.  Thanks! Contact information: [address] [place] [address] [phone]                 Next level of care provider has access to [facility] Link:no  Safety Planning and Suicide Prevention discussed: Yes,  SPE completed with patient.  SPE attempts made with pt's mother.     Has patient been referred to the Quitline?: Patient refused referral  Patient has been referred for addiction treatment: Pt. refused referral  Harden Mo, LCSW 03/10/2021, 10:08 AM

## 2021-03-10 NOTE — Plan of Care (Signed)
  Problem: Coping Skills Goal: STG - Patient will identify 3 positive coping skills strategies to use for SI post d/c within 5 recreation therapy group sessions Description: STG - Patient will identify 3 positive coping skills strategies to use for SI post d/c within 5 recreation therapy group sessions 03/10/2021 1301 by Alveria Apley, LRT Outcome: Adequate for Discharge 03/10/2021 1301 by Alveria Apley, LRT Outcome: Adequate for Discharge

## 2021-03-10 NOTE — BHH Suicide Risk Assessment (Signed)
[facility] Discharge Suicide Risk Assessment   Principal Problem: MDD (major depressive disorder), recurrent severe, without psychosis (HCC) Discharge Diagnoses: Principal Problem:   MDD (major depressive disorder), recurrent severe, without psychosis (HCC) Active Problems:   Essential hypertension   Tobacco abuse   Medication overdose   Total Time spent with patient: 35 minutes- 25 minutes face-to-face contact with patient, 10 minutes documentation, coordination of care, scripts   Musculoskeletal: Strength & Muscle Tone: within normal limits Gait & Station: normal Patient leans: N/A  Psychiatric Specialty Exam  Presentation  General Appearance: Appropriate for Environment; Casual  Eye Contact:Good  Speech:Clear and Coherent; Normal Rate  Speech Volume:Normal  Handedness:Right   Mood and Affect  Mood:Euthymic  Duration of Depression Symptoms: Greater than two weeks  Affect:Congruent   Thought Process  Thought Processes:Coherent; Goal Directed  Descriptions of Associations:Intact  Orientation:Full (Time, Place and Person)  Thought Content:Logical  History of Schizophrenia/Schizoaffective disorder:No  Duration of Psychotic Symptoms:No data recorded Hallucinations:Hallucinations: None  Ideas of Reference:None  Suicidal Thoughts:Suicidal Thoughts: No  Homicidal Thoughts:Homicidal Thoughts: No   Sensorium  Memory:Immediate Good; Recent Good; Remote Good  Judgment:Good  Insight:Good   Executive Functions  Concentration:Good  Attention Span:Good  Recall:Good  Fund of Knowledge:Good  Language:Good   Psychomotor Activity  Psychomotor Activity:Psychomotor Activity: Normal   Assets  Assets:Communication Skills; Desire for Improvement; Financial Resources/Insurance; Housing; Physical Health; Resilience; Social Support; Talents/Skills   Sleep  Sleep:Sleep: Good Number of Hours of Sleep: 7.75   Physical Exam: Physical Exam ROS Blood  pressure 120/83, pulse 87, temperature 98.4 F (36.9 C), temperature source Oral, resp. rate 17, height 5\' 4"  (1.626 m), weight 83 kg, SpO2 99 %. Body mass index is 31.41 kg/m.  Mental Status Per Nursing Assessment::   On Admission:  Suicidal ideation indicated by patient  Demographic Factors:  Caucasian  Loss Factors: NA  Historical Factors: Prior suicide attempts  Risk Reduction Factors:   Responsible for children under 74 years of age, Sense of responsibility to family, Religious beliefs about death, Living with another person, especially a relative, Positive social support, Positive therapeutic relationship, and Positive coping skills or problem solving skills  Continued Clinical Symptoms:  Depression:   Recent sense of peace/wellbeing Alcohol/Substance Abuse/Dependencies Previous Psychiatric Diagnoses and Treatments  Cognitive Features That Contribute To Risk:  None    Suicide Risk:  Minimal: No identifiable suicidal ideation.  Patients presenting with no risk factors but with morbid ruminations; may be classified as minimal risk based on the severity of the depressive symptoms   Follow-up Information     15, MSW, LSCWA, LCASA. Schedule an appointment as soon as possible for a visit.   Why: Please call to make an intake appointment once discharged. Contact information: [facility]  [address]  [address] [place], [place] [phone]        [facility] Follow up.   Why: If you are unable to go to your desired provider you may be able to follow up with this provider.  Thanks! Contact information: [address] [place] [phone]         Pc, [phone] Follow up.   Why: If you are unable to go to your desired provider you may be able to follow up with this provider.  Thanks! Contact information: [address] [place] [phone]                  Plan Of  Care/Follow-up recommendations:  Activity:  as tolerated Diet:  regular diet  Olivia Sans, MD 03/10/2021, 10:34 AM

## 2021-03-10 NOTE — Progress Notes (Signed)
Recreation Therapy Notes   Date: 03/10/2021  Time: 10:00 am  Location: Craft room   Behavioral response: Appropriate   Intervention Topic: Goals   Discussion/Intervention:  Group content on today was focused on goals. Patients described what goals are and how they define goals. Individuals expressed how they go about setting goals and reaching them. The group identified how important goals are and if they make short term goals to reach long term goals. Patients described how many goals they work on at a time and what affects them not reaching their goal. Individuals described how much time they put into planning and obtaining their goals. The group participated in the intervention "My Goal Board" and made personal goal boards to help them achieve their goal. Clinical Observations/Feedback: Patient came to group and defined goals as something your working towards. She expressed that goals are important to make life better and become happy. Individual was social with peers and staff while participating in the intervention.  Shivangi Lutz LRT/CTRS         Garvey Westcott 03/10/2021 12:56 PM

## 2021-03-11 ENCOUNTER — Telehealth: Payer: Self-pay

## 2021-03-11 NOTE — Telephone Encounter (Signed)
Transition Care Management Unsuccessful Follow-up Telephone Call  Date of discharge and from where:  03/10/2021-[facility]  Attempts:  1st Attempt  Reason for unsuccessful TCM follow-up call:  Voice mail full

## 2021-03-12 ENCOUNTER — Ambulatory Visit: Payer: Self-pay

## 2021-03-12 ENCOUNTER — Other Ambulatory Visit: Payer: Self-pay | Admitting: Licensed Clinical Social Worker

## 2021-03-12 NOTE — Telephone Encounter (Signed)
Transition Care Management Follow-up Telephone Call Date of discharge and from where: 03/10/2021-[facility] How have you been since you were released from the hospital? Patient stated she is doing ok.  Any questions or concerns? No  Items Reviewed: Did the pt receive and understand the discharge instructions provided? Yes  Medications obtained and verified?  No medications given at discharge  Other? No  Any new allergies since your discharge? No  Dietary orders reviewed? N/A Do you have support at home? Yes   Home Care and Equipment/Supplies: Were home health services ordered? not applicable If so, what is the name of the agency? N/A  Has the agency set up a time to come to the patient's home? not applicable Were any new equipment or medical supplies ordered?  No What is the name of the medical supply agency? N/A Were you able to get the supplies/equipment? not applicable Do you have any questions related to the use of the equipment or supplies? No  Functional Questionnaire: (I = Independent and D = Dependent) ADLs: I  Bathing/Dressing- I  Meal Prep- I  Eating- I  Maintaining continence- I  Transferring/Ambulation- I  Managing Meds- I  Follow up appointments reviewed:  PCP Hospital f/u appt confirmed? No   Specialist Hospital f/u appt confirmed? No   Are transportation arrangements needed? No  If their condition worsens, is the pt aware to call PCP or go to the Emergency Dept.? Yes Was the patient provided with contact information for the PCP's office or ED? Yes Was to pt encouraged to call back with questions or concerns? Yes

## 2021-03-12 NOTE — Patient Instructions (Signed)
Visit Information  Olivia Melendez was given information about Medicaid Managed Care team care coordination services as a part of their [place] benefit. Olivia Melendez verbally consented to engagement with the [facility] Managed Care team.   If you are experiencing a medical emergency, please call 911 or report to your local emergency department or urgent care.   If you have a non-emergency medical problem during routine business hours, please contact your provider's office and ask to speak with a nurse.   For questions related to your [place] health plan, please call: [phone]  If you would like to schedule transportation through your [place] plan, please call the following number at least 2 days in advance of your appointment: [phone].   Call the Behavioral Health Crisis Line at [phone], at any time, 24 hours a day, 7 days a week. If you are in danger or need immediate medical attention call 911.  If you would like help to quit smoking, call 1-800-QUIT-NOW ((613)046-8937) OR Espaol: 1-855-Djelo-Ya (9-150-569-7948) o para ms informacin haga clic aqu or Text READY to 016-553 to register via text  Olivia Melendez - following are the goals we discussed in your visit today:   Goals Addressed             This Visit's Progress    Begin and Stick with Counseling-Depression       Timeframe:  Long-Range Goal Priority:  High Start Date:   03/05/21                          Expected End Date:   ongoing                 Follow Up Date- 03/22/21   - check out counseling - keep 90 percent of counseling appointments - schedule counseling appointment    Why is this important?   Beating depression may take some time.  If you don't feel better right away, don't give up on your treatment plan.           Please see education materials related to mental heath resources provided  by text message  The patient verbalized understanding  of instructions provided today and declined a print copy of patient instruction materials.   Licensed Clinical Social Worker will follow up on 03/22/21  Dickie La, BSW, MSW, [employer] Managed Medicaid LCSW [facility]  [facility] [email or web address] Phone: [phone]
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
## 2021-03-22 ENCOUNTER — Other Ambulatory Visit: Payer: Self-pay | Admitting: Licensed Clinical Social Worker
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
## 2021-03-22 NOTE — Patient Instructions (Signed)
Visit Information  Olivia Melendez was given information about Medicaid Managed Care team care coordination services as a part of their [place] benefit. Olivia Melendez verbally consented to engagement with the [facility] Managed Care team.   If you are experiencing a medical emergency, please call 911 or report to your local emergency department or urgent care.   If you have a non-emergency medical problem during routine business hours, please contact your provider's office and ask to speak with a nurse.   For questions related to your [place] health plan, please call: [phone]  If you would like to schedule transportation through your [place] plan, please call the following number at least 2 days in advance of your appointment: [phone].   Call the Behavioral Health Crisis Line at [phone], at any time, 24 hours a day, 7 days a week. If you are in danger or need immediate medical attention call 911.  If you would like help to quit smoking, call 1-800-QUIT-NOW (480-626-7292) OR Espaol: 1-855-Djelo-Ya (0-102-725-3664) o para ms informacin haga clic aqu or Text READY to 403-474 to register via text  Olivia Melendez - following are the goals we discussed in your visit today:   Goals Addressed             This Visit's Progress    Begin and Stick with Counseling-Depression       Timeframe:  Long-Range Goal Priority:  High Start Date:   03/05/21                          Expected End Date:   ongoing                 Follow Up Date- 04/02/21   - check out counseling - keep 90 percent of counseling appointments - schedule counseling appointment    Why is this important?   Beating depression may take some time.  If you don't feel better right away, don't give up on your treatment plan.          Dickie La, BSW, MSW, [employer] Managed Medicaid LCSW [facility]  [name] Phone:  [phone]

## 2021-04-02 ENCOUNTER — Ambulatory Visit: Payer: Self-pay

## 2021-04-02 ENCOUNTER — Telehealth: Payer: Self-pay | Admitting: Licensed Clinical Social Worker

## 2021-04-02 NOTE — Patient Outreach (Signed)
  [facility]) Care Management  [facility] Social Work  04/02/2021  Olivia Melendez February 01, 1981 [id number]  Encounter Medications:  Outpatient Encounter Medications as of 04/02/2021  Medication Sig   buPROPion (WELLBUTRIN XL) 150 MG 24 hr tablet Take 1 tablet (150 mg total) by mouth daily.   escitalopram (LEXAPRO) 10 MG tablet Take 2 tablets (20 mg total) by mouth daily.   lisinopril (ZESTRIL) 10 MG tablet Take 1 tablet by mouth daily.   traZODone (DESYREL) 50 MG tablet Take 50 mg by mouth at bedtime.   No facility-administered encounter medications on file as of 04/02/2021.    Functional Status:  No flowsheet data found.  Fall/Depression Screening:  PHQ 2/9 Scores 03/05/2021  PHQ - 2 Score 2  PHQ- 9 Score 8    Assessment:  Care Plan There are no care plans that you recently modified to display for this patient.    Goals Addressed   None    Patient was sent text message by [facility] LCSW questioning if she would like to reschedule appointment for this afternoon or for 04/23/21. Patient reports that work has been hectic today and she would prefer to reschedule for 04/23/21. Patient reports that she no longer wanting to pursue mental health treatment at this time but will at least take a few weeks to consider this needed support.   Dickie La, BSW, MSW, [employer] Managed Medicaid LCSW [facility]  [facility] [email or web address] Phone: [phone]

## 2021-04-20 DIAGNOSIS — Z30432 Encounter for removal of intrauterine contraceptive device: Secondary | ICD-10-CM | POA: Diagnosis not present

## 2021-04-20 DIAGNOSIS — F325 Major depressive disorder, single episode, in full remission: Secondary | ICD-10-CM | POA: Diagnosis not present

## 2021-04-20 DIAGNOSIS — Z6833 Body mass index (BMI) 33.0-33.9, adult: Secondary | ICD-10-CM | POA: Diagnosis not present

## 2021-04-22 ENCOUNTER — Ambulatory Visit: Payer: Self-pay

## 2021-04-23 ENCOUNTER — Other Ambulatory Visit: Payer: Self-pay | Admitting: Licensed Clinical Social Worker
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
## 2021-04-23 NOTE — Patient Instructions (Signed)
Visit Information  Olivia Melendez was given information about Medicaid Managed Care team care coordination services as a part of their [place] benefit. Olivia Melendez verbally consented to engagement with the [facility] Managed Care team.   If you are experiencing a medical emergency, please call 911 or report to your local emergency department or urgent care.   If you have a non-emergency medical problem during routine business hours, please contact your provider's office and ask to speak with a nurse.   For questions related to your [place] health plan, please call: [phone]  If you would like to schedule transportation through your [place] plan, please call the following number at least 2 days in advance of your appointment: [phone].   Call the Behavioral Health Crisis Line at [phone], at any time, 24 hours a day, 7 days a week. If you are in danger or need immediate medical attention call 911.  If you would like help to quit smoking, call 1-800-QUIT-NOW (747-555-1115) OR Espaol: 1-855-Djelo-Ya (3-557-322-0254) o para ms informacin haga clic aqu or Text READY to 270-623 to register via text  Olivia Melendez - following are the goals we discussed in your visit today:   Goals Addressed             This Visit's Progress    Begin and Stick with Counseling-Depression       Timeframe:  Long-Range Goal Priority:  High Start Date:   03/05/21                          Expected End Date:   ongoing                 Follow Up Date- 05/28/21   - check out counseling - keep 90 percent of counseling appointments - schedule counseling appointment    Why is this important?   Beating depression may take some time.  If you don't feel better right away, don't give up on your treatment plan.           Olivia Melendez, BSW, MSW, [employer] Managed Medicaid LCSW [facility]  [facility] [email or web address] Phone:  [phone]

## 2021-05-06 ENCOUNTER — Ambulatory Visit: Payer: Self-pay

## 2021-05-27 ENCOUNTER — Ambulatory Visit: Payer: Self-pay

## 2021-09-06 ENCOUNTER — Telehealth: Payer: Self-pay | Admitting: *Deleted

## 2021-09-06 NOTE — Chronic Care Management (AMB) (Signed)
° ° °  Outreach Note   09/06/2021 Name: Olivia Melendez MRN: [id number] DOB: 1981-01-11  Referred by: Peggyann Juba, NP Reason for referral : High Risk Managed Medicaid (Outreach to schedule call with SW)   An unsuccessful telephone outreach was attempted today. The patient was referred to the case management team for assistance with care management and care coordination.    Follow Up Plan: A HIPAA compliant phone message was left for the patient providing contact information and requesting a return call. and The Managed Medicaid care management team will reach out to the patient again over the next 1 days.   [place] Direct Dial: [phone]

## 2021-09-06 NOTE — Chronic Care Management (AMB) (Signed)
09/06/2021 Name: Olivia Melendez MRN: [id number] DOB: [birth year]  Referred by: Chilton Greathouse, NP Reason for referral : High Risk Managed Medicaid (Outreach to schedule call with SW)   An unsuccessful telephone outreach was attempted today. The patient was referred to the case management team for assistance with care management and care coordination.    Follow Up Plan: A HIPAA compliant phone message was left for the patient providing contact information and requesting a return call. and The Managed Medicaid care management team will reach out to the patient again over the next 1 days.    Gwenevere Ghazi  Care Guide, High Risk Medicaid Managed Care Embedded Care Coordination [facility]   [facility]   Care Management  Direct Dial: [phone]

## 2021-10-28 DIAGNOSIS — R1030 Lower abdominal pain, unspecified: Secondary | ICD-10-CM | POA: Diagnosis not present

## 2021-10-28 DIAGNOSIS — R1031 Right lower quadrant pain: Secondary | ICD-10-CM | POA: Diagnosis not present

## 2021-10-28 DIAGNOSIS — H029 Unspecified disorder of eyelid: Secondary | ICD-10-CM | POA: Diagnosis not present

## 2021-12-05 ENCOUNTER — Emergency Department
Admission: EM | Admit: 2021-12-05 | Discharge: 2021-12-05 | Disposition: A | Discharge status: Home / Self Care | Payer: Managed Care, Other (non HMO) | Attending: Emergency Medicine | Admitting: Emergency Medicine

## 2021-12-05 ENCOUNTER — Other Ambulatory Visit: Payer: Self-pay

## 2021-12-05 ENCOUNTER — Emergency Department: Payer: Managed Care, Other (non HMO)

## 2021-12-05 DIAGNOSIS — Y92410 Unspecified street and highway as the place of occurrence of the external cause: Secondary | ICD-10-CM | POA: Diagnosis not present

## 2021-12-05 DIAGNOSIS — Y9355 Activity, bike riding: Secondary | ICD-10-CM | POA: Diagnosis not present

## 2021-12-05 DIAGNOSIS — S0081XA Abrasion of other part of head, initial encounter: Secondary | ICD-10-CM | POA: Diagnosis not present

## 2021-12-05 DIAGNOSIS — S52501A Unspecified fracture of the lower end of right radius, initial encounter for closed fracture: Secondary | ICD-10-CM | POA: Insufficient documentation

## 2021-12-05 DIAGNOSIS — S6991XA Unspecified injury of right wrist, hand and finger(s), initial encounter: Secondary | ICD-10-CM | POA: Diagnosis present

## 2021-12-05 MED ORDER — ONDANSETRON 4 MG PO TBDP
4.0000 mg | ORAL_TABLET | Freq: Once | ORAL | Status: AC
  Administered 2021-12-05: 4 mg via ORAL
  Filled 2021-12-05: qty 1

## 2021-12-05 MED ORDER — OXYCODONE-ACETAMINOPHEN 5-325 MG PO TABS: 1.0000 | ORAL_TABLET | Freq: Four times a day (QID) | ORAL | 0 refills | Status: AC | PRN

## 2021-12-05 MED ORDER — OXYCODONE-ACETAMINOPHEN 5-325 MG PO TABS
1.0000 | ORAL_TABLET | Freq: Once | ORAL | Status: AC
  Administered 2021-12-05: 1 via ORAL
  Filled 2021-12-05: qty 1

## 2021-12-05 NOTE — ED Notes (Signed)
Tech placed splint on left wrist area.  Rates pain 7.  ?
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
## 2021-12-05 NOTE — Discharge Instructions (Addendum)
You can take Percocet for pain.  Please take stool softener while taking Percocet. ?You can follow-up with orthopedics, Dr. Hyacinth Meeker. ?
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
## 2022-03-09 ENCOUNTER — Other Ambulatory Visit: Payer: Self-pay

## 2022-03-09 ENCOUNTER — Emergency Department: Payer: Managed Care, Other (non HMO)

## 2022-03-09 DIAGNOSIS — R7401 Elevation of levels of liver transaminase levels: Secondary | ICD-10-CM | POA: Diagnosis not present

## 2022-03-09 DIAGNOSIS — K8050 Calculus of bile duct without cholangitis or cholecystitis without obstruction: Secondary | ICD-10-CM | POA: Diagnosis not present

## 2022-03-09 DIAGNOSIS — K8070 Calculus of gallbladder and bile duct without cholecystitis without obstruction: Secondary | ICD-10-CM | POA: Diagnosis not present

## 2022-03-09 DIAGNOSIS — R0789 Other chest pain: Secondary | ICD-10-CM | POA: Diagnosis not present

## 2022-03-09 DIAGNOSIS — R935 Abnormal findings on diagnostic imaging of other abdominal regions, including retroperitoneum: Secondary | ICD-10-CM | POA: Diagnosis not present

## 2022-03-09 DIAGNOSIS — R1010 Upper abdominal pain, unspecified: Secondary | ICD-10-CM | POA: Diagnosis present

## 2022-03-09 DIAGNOSIS — R1011 Right upper quadrant pain: Secondary | ICD-10-CM | POA: Diagnosis not present

## 2022-03-09 DIAGNOSIS — R945 Abnormal results of liver function studies: Secondary | ICD-10-CM | POA: Diagnosis not present

## 2022-03-09 DIAGNOSIS — K8020 Calculus of gallbladder without cholecystitis without obstruction: Secondary | ICD-10-CM | POA: Diagnosis not present

## 2022-03-09 LAB — COMPREHENSIVE METABOLIC PANEL
ALT: 117 U/L — ABNORMAL HIGH (ref 0–44)
AST: 494 U/L — ABNORMAL HIGH (ref 15–41)
Albumin: 4.2 g/dL (ref 3.5–5.0)
Alkaline Phosphatase: 199 U/L — ABNORMAL HIGH (ref 38–126)
Anion gap: 7 (ref 5–15)
BUN: 8 mg/dL (ref 6–20)
CO2: 26 mmol/L (ref 22–32)
Calcium: 8.9 mg/dL (ref 8.9–10.3)
Chloride: 104 mmol/L (ref 98–111)
Creatinine, Ser: 0.7 mg/dL (ref 0.44–1.00)
GFR, Estimated: >60 mL/min (ref >60)
Glucose, Bld: 105 mg/dL — ABNORMAL HIGH (ref 70–99)
Potassium: 4 mmol/L (ref 3.5–5.1)
Sodium: 137 mmol/L (ref 135–145)
Total Bilirubin: 1.3 mg/dL — ABNORMAL HIGH (ref 0.3–1.2)
Total Protein: 7.8 g/dL (ref 6.5–8.1)

## 2022-03-09 LAB — CBC
HCT: 44.3 % (ref 36.0–46.0)
Hemoglobin: 15 g/dL (ref 12.0–15.0)
MCH: 31.4 pg (ref 26.0–34.0)
MCHC: 33.9 g/dL (ref 30.0–36.0)
MCV: 92.7 fL (ref 80.0–100.0)
Platelets: 233 10*3/uL (ref 150–400)
RBC: 4.78 MIL/uL (ref 3.87–5.11)
RDW: 12.7 % (ref 11.5–15.5)
WBC: 8 10*3/uL (ref 4.0–10.5)
nRBC: 0 % (ref 0.0–0.2)

## 2022-03-09 LAB — LIPASE, BLOOD: Lipase: 54 U/L — ABNORMAL HIGH (ref 11–51)

## 2022-03-09 LAB — TROPONIN I (HIGH SENSITIVITY): Troponin I (High Sensitivity): 4 ng/L (ref <18)

## 2022-03-09 NOTE — ED Triage Notes (Signed)
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  Pt also states she is having severe pain in epigastric area that had her doubled over at one point.  Pt denies dizziness or sob at this time.  Pt A&O x4 at this time in NAD in triage.

## 2022-03-10 ENCOUNTER — Emergency Department
Admission: EM | Admit: 2022-03-10 | Discharge: 2022-03-10 | Disposition: A | Discharge status: Home / Self Care | Payer: Managed Care, Other (non HMO) | Attending: Emergency Medicine | Admitting: Emergency Medicine

## 2022-03-10 ENCOUNTER — Emergency Department: Payer: Managed Care, Other (non HMO)

## 2022-03-10 DIAGNOSIS — K8050 Calculus of bile duct without cholangitis or cholecystitis without obstruction: Secondary | ICD-10-CM | POA: Diagnosis not present

## 2022-03-10 DIAGNOSIS — R935 Abnormal findings on diagnostic imaging of other abdominal regions, including retroperitoneum: Secondary | ICD-10-CM | POA: Diagnosis not present

## 2022-03-10 DIAGNOSIS — R1011 Right upper quadrant pain: Secondary | ICD-10-CM | POA: Diagnosis not present

## 2022-03-10 DIAGNOSIS — R7989 Other specified abnormal findings of blood chemistry: Secondary | ICD-10-CM

## 2022-03-10 DIAGNOSIS — R945 Abnormal results of liver function studies: Secondary | ICD-10-CM | POA: Diagnosis not present

## 2022-03-10 DIAGNOSIS — K8020 Calculus of gallbladder without cholecystitis without obstruction: Secondary | ICD-10-CM

## 2022-03-10 MED ORDER — DOCUSATE SODIUM 100 MG PO CAPS: ORAL_CAPSULE | ORAL | 0 refills | Status: DC

## 2022-03-10 MED ORDER — ONDANSETRON 4 MG PO TBDP: ORAL_TABLET | ORAL | 0 refills | Status: DC

## 2022-03-10 MED ORDER — OXYCODONE-ACETAMINOPHEN 5-325 MG PO TABS: 2.0000 | ORAL_TABLET | Freq: Four times a day (QID) | ORAL | 0 refills | Status: DC | PRN

## 2022-03-10 NOTE — ED Notes (Signed)
Patient transported to MRI 

## 2022-03-10 NOTE — Discharge Instructions (Addendum)
You have been seen in the Emergency Department (ED) for abdominal pain.  Your evaluation suggests that your pain is caused by gallstones.  Fortunately you do not need immediate surgery at this time, but it is important that you follow up with a surgeon as an outpatient; typically surgical removal of the gallbladder is the only thing that will definitively fix your issue.  Read through the included information about a bland diet, and use any prescribed medications as instructed.  Avoid smoking and alcohol use. ° °Please follow up as instructed above regarding today’s emergent visit and the symptoms that are bothering you. ° °Take Percocet as prescribed. Do not drink alcohol, drive or participate in any other potentially dangerous activities while taking this medication as it may make you sleepy. Do not take this medication with any other sedating medications, either prescription or over-the-counter. If you were prescribed Percocet or Vicodin, do not take these with acetaminophen (Tylenol) as it is already contained within these medications. °  °This medication is an opiate (or narcotic) pain medication and can be habit forming.  Use it as little as possible to achieve adequate pain control.  Do not use or use it with extreme caution if you have a history of opiate abuse or dependence.  If you are on a pain contract with your primary care doctor or a pain specialist, be sure to let them know you were prescribed this medication today from the [facility].  This medication is intended for your use only - do not give any to anyone else and keep it in a secure place where nobody else, especially children, have access to it.  It will also cause or worsen constipation, so you may want to consider taking an over-the-counter stool softener while you are taking this medication. ° °Return to the ED if your abdominal pain worsens or fails to improve, you develop bloody vomiting, bloody diarrhea, you  are unable to tolerate fluids due to vomiting, fever greater than 101, or other symptoms that concern you. ° °

## 2022-03-10 NOTE — ED Provider Notes (Signed)
[facility] Provider Note    Event Date/Time   First MD Initiated Contact with Patient 03/10/22 0205     (approximate)   History   Chest Pain   HPI  Olivia Melendez is a 41 y.o. female who is generally healthy and presents for evaluation of a cute onset and severe pain in her upper abdomen that radiates through around to the right side of her back.  It is accompanied with nausea and vomiting.  She had a similar episode a few weeks ago, but this 1 lasted several hours.  It doubled her over in pain at 1 point.  The pain is better now, but she still feels "sore".  She denies difficulty breathing although the pain was so bad it took her breath away.  No chest pain, just the upper abdominal pain.  She felt some chills when the pain was very bad.  She has never been told before about liver or gallbladder problems.  No recent dysuria or increased urinary frequency.     Physical Exam   Triage Vital Signs: ED Triage Vitals  Enc Vitals Group     BP 03/09/22 2014 (!) 156/91     Pulse Rate 03/09/22 2014 76     Resp 03/09/22 2014 20     Temp 03/09/22 2014 98.4 F (36.9 C)     Temp Source 03/09/22 2014 Oral     SpO2 03/09/22 2014 100 %     Weight 03/09/22 2016 72.6 kg (160 lb)     Height 03/09/22 2016 1.6 m (5\' 3" )     Head Circumference --      Peak Flow --      Pain Score 03/09/22 2016 8     Pain Loc --      Pain Edu? --      Excl. in GC? --     Most recent vital signs: Vitals:   03/10/22 0430 03/10/22 0500  BP: 110/68 114/78  Pulse: 64 (!) 54  Resp: 18 18  Temp:    SpO2: 96% 97%     General: Awake, no distress.  CV:  Good peripheral perfusion.  Normal heart sounds. Resp:  Normal effort.  Lungs are clear to auscultation. Abd:  No distention.  Tenderness to palpation of the epigastrium and right upper quadrant with equivocal Murphy sign.  No lower abdominal tenderness.  No rebound or guarding. Other:  Ambulatory but appears a little bit  uncomfortable when she does so.  Normal mood and affect.   ED Results / Procedures / Treatments   Labs (all labs ordered are listed, but only abnormal results are displayed) Labs Reviewed  COMPREHENSIVE METABOLIC PANEL - Abnormal; Notable for the following components:      Result Value   Glucose, Bld 105 (*)    AST 494 (*)    ALT 117 (*)    Alkaline Phosphatase 199 (*)    Total Bilirubin 1.3 (*)    All other components within normal limits  LIPASE, BLOOD - Abnormal; Notable for the following components:   Lipase 54 (*)    All other components within normal limits  CBC  POC URINE PREG, ED  TROPONIN I (HIGH SENSITIVITY)     EKG  ED ECG REPORT I, 03/12/22, the attending physician, personally viewed and interpreted this ECG.  Date: 03/09/2022 EKG Time: 20: 14 Rate: 76 Rhythm: normal sinus rhythm QRS Axis: normal Intervals: normal ST/T Wave abnormalities: normal Narrative Interpretation: no evidence of acute  ischemia    RADIOLOGY I viewed and interpreted the patient's ultrasound and I can see evidence of gallstones.  The radiologist commented that there is no evidence of cholecystitis.  As documented below, MRCP shows cholelithiasis without cholecystitis or choledocholithiasis.    PROCEDURES:  Critical Care performed: No  Procedures   MEDICATIONS ORDERED IN ED: Medications - No data to display   IMPRESSION / MDM / ASSESSMENT AND PLAN / ED COURSE  I reviewed the triage vital signs and the nursing notes.                              Differential diagnosis includes, but is not limited to, biliary colic, cholecystitis, choledocholithiasis, pancreatitis, much less likely atypical presentation of ACS or PE.  Patient's presentation is most consistent with acute presentation with potential threat to life or bodily function.  Afebrile, vital signs are stable and reassuring.  Labs/studies ordered include comprehensive metabolic panel, CBC, high-sensitivity  troponin, lipase, right upper quadrant ultrasound.  Labs are notable for transaminitis but an essentially normal bilirubin.  This raises concern for cholecystitis.  However, as documented above, her ultrasound does not show signs of cholecystitis but does confirm cholelithiasis.  No leukocytosis which is reassuring.  Very slightly elevated lipase but not consistent with pancreatitis.  She feels better than she did earlier but still has some tenderness to palpation.  She may have passed a stone, but she may also have choledocholithiasis which could lead to substantially worsening overall medical condition and even more serious conditions like cholangitis, liver failure, or pancreatitis.  I talked with the patient and explained the situation.  We agreed to proceed with MRCP; if she has persistent pain and choledocholithiasis demonstrated on imaging, she will likely need transfer to a facility that can provide an ERCP.  Patient understands and agrees with the plan.  She feels okay right now and is declining any medication currently.   Clinical Course as of 03/10/22 0507  Thu Mar 10, 2022  0502 MR ABDOMEN MRCP WO CONTRAST I viewed and interpreted the patient's MRCP.  I could not appreciate any acute abnormalities.  The radiologist interpreted as cholelithiasis without evidence of choledocholithiasis nor cholecystitis.  I updated the patient who is resting comfortably.  She said the pain is better.  She feels a little bit sore but no longer with the abdominal pain she had earlier.  I had my usual and customary cholelithiasis/biliary colic discussion with the patient.  Prescriptions as listed below.  I encouraged close outpatient follow-up with surgery and I gave my usual and customary return precautions. [CF]    Clinical Course User Index [CF] Loleta Rose, MD     FINAL CLINICAL IMPRESSION(S) / ED DIAGNOSES   Final diagnoses:  Calculus of gallbladder without cholecystitis without obstruction   Biliary colic  Elevated LFTs     Rx / DC Orders   ED Discharge Orders          Ordered    oxyCODONE-acetaminophen (PERCOCET) 5-325 MG tablet  Every 6 hours PRN        03/10/22 0507    ondansetron (ZOFRAN-ODT) 4 MG disintegrating tablet        03/10/22 0507    docusate sodium (COLACE) 100 MG capsule        03/10/22 0507             Note:  This document was prepared using Dragon voice recognition software and may include  unintentional dictation errors.   Loleta Rose, MD 03/10/22 [phone]

## 2022-03-10 NOTE — ED Notes (Signed)
Patient verbalized discharge understanding  

## 2022-03-16 ENCOUNTER — Telehealth: Payer: Self-pay | Admitting: Surgery

## 2022-03-16 ENCOUNTER — Encounter: Payer: Self-pay | Admitting: Surgery

## 2022-03-16 ENCOUNTER — Other Ambulatory Visit: Payer: Self-pay

## 2022-03-16 ENCOUNTER — Ambulatory Visit (INDEPENDENT_AMBULATORY_CARE_PROVIDER_SITE_OTHER): Payer: Managed Care, Other (non HMO) | Admitting: Surgery

## 2022-03-16 VITALS — BP 128/82 | HR 83 | Temp 98.1°F | Ht 63.0 in | Wt 169.8 lb

## 2022-03-16 DIAGNOSIS — Z9889 Other specified postprocedural states: Secondary | ICD-10-CM

## 2022-03-16 DIAGNOSIS — K8020 Calculus of gallbladder without cholecystitis without obstruction: Secondary | ICD-10-CM | POA: Diagnosis not present

## 2022-03-16 NOTE — Patient Instructions (Addendum)
Our surgery scheduler will call you within 24-48 hours to schedule your surgery. Please have the Blue surgery sheet available when speaking with her.   Referral sent to Neurology. Someone from their office will call to schedule an appointment.    Minimally Invasive Cholecystectomy Minimally invasive cholecystectomy is surgery to remove the gallbladder. The gallbladder is a pear-shaped organ that lies beneath the liver on the right side of the body. The gallbladder stores bile, which is a fluid that helps the body digest fats. Cholecystectomy is often done to treat inflammation (irritation and swelling) of the gallbladder (cholecystitis). This condition is usually caused by a buildup of gallstones (cholelithiasis) in the gallbladder or when the fluid in the gall bladder becomes stagnant because gallstones get stuck in the ducts (tubes) and block the flow of bile. This can result in inflammation and pain. In severe cases, emergency surgery may be required. This procedure is done through small incisions in the abdomen, instead of one large incision. It is also called laparoscopic surgery. A thin scope with a camera (laparoscope) is inserted through one incision. Then surgical instruments are inserted through the other incisions. In some cases, a minimally invasive surgery may need to be changed to a surgery that is done through a larger incision. This is called open surgery. Tell a health care provider about: Any allergies you have. All medicines you are taking, including vitamins, herbs, eye drops, creams, and over-the-counter medicines. Any problems you or family members have had with anesthetic medicines. Any bleeding problems you have. Any surgeries you have had. Any medical conditions you have. Whether you are pregnant or may be pregnant. What are the risks? Generally, this is a safe procedure. However, problems may occur, including: Infection. Bleeding. Allergic reactions to medicines. Damage  to nearby structures or organs. A gallstone remaining in the common bile duct. The common bile duct carries bile from the gallbladder to the small intestine. A bile leak from the liver or cystic duct after your gallbladder is removed. What happens before the procedure? When to stop eating and drinking Follow instructions from your health care provider about what you may eat and drink before your procedure. These may include: 8 hours before the procedure Stop eating most foods. Do not eat meat, fried foods, or fatty foods. Eat only light foods, such as toast or crackers. All liquids are okay except energy drinks and alcohol. 6 hours before the procedure Stop eating. Drink only clear liquids, such as water, clear fruit juice, black coffee, plain tea, and sports drinks. Do not drink energy drinks or alcohol. 2 hours before the procedure Stop drinking all liquids. You may be allowed to take medicines with small sips of water. If you do not follow your health care provider's instructions, your procedure may be delayed or canceled. Medicines Ask your health care provider about: Changing or stopping your regular medicines. This is especially important if you are taking diabetes medicines or blood thinners. Taking medicines such as aspirin and ibuprofen. These medicines can thin your blood. Do not take these medicines unless your health care provider tells you to take them. Taking over-the-counter medicines, vitamins, herbs, and supplements. General instructions If you will be going home right after the procedure, plan to have a responsible adult: Take you home from the hospital or clinic. You will not be allowed to drive. Care for you for the time you are told. Do not use any products that contain nicotine or tobacco for at least 4 weeks before the  procedure. These products include cigarettes, chewing tobacco, and vaping devices, such as e-cigarettes. If you need help quitting, ask your health  care provider. Ask your health care provider: How your surgery site will be marked. What steps will be taken to help prevent infection. These may include: Removing hair at the surgery site. Washing skin with a germ-killing soap. Taking antibiotic medicine. What happens during the procedure?  An IV will be inserted into one of your veins. You will be given one or both of the following: A medicine to help you relax (sedative). A medicine to make you fall asleep (general anesthetic). Your surgeon will make several small incisions in your abdomen. The laparoscope will be inserted through one of the small incisions. The camera on the laparoscope will send images to a monitor in the operating room. This lets your surgeon see inside your abdomen. A gas will be pumped into your abdomen. This will expand your abdomen to give the surgeon more room to perform the surgery. Other tools that are needed for the procedure will be inserted through the other incisions. The gallbladder will be removed through one of the incisions. Your common bile duct may be examined. If stones are found in the common bile duct, they may be removed. After your gallbladder has been removed, the incisions will be closed with stitches (sutures), staples, or skin glue. Your incisions will be covered with a bandage (dressing). The procedure may vary among health care providers and hospitals. What happens after the procedure? Your blood pressure, heart rate, breathing rate, and blood oxygen level will be monitored until you leave the hospital or clinic. You will be given medicines as needed to control your pain. You may have a drain placed in the incision. The drain will be removed a day or two after the procedure. Summary Minimally invasive cholecystectomy, also called laparoscopic cholecystectomy, is surgery to remove the gallbladder using small incisions. Tell your health care provider about all the medical conditions you have  and all the medicines you are taking for those conditions. Before the procedure, follow instructions about when to stop eating and drinking and changing or stopping medicines. Plan to have a responsible adult care for you for the time you are told after you leave the hospital or clinic. This information is not intended to replace advice given to you by your health care provider. Make sure you discuss any questions you have with your health care provider. Document Revised: 02/02/2021 Document Reviewed: 02/02/2021 Elsevier Patient Education  2023 [employer].

## 2022-03-16 NOTE — Telephone Encounter (Signed)
Patient has been advised of Pre-Admission date/time, and Surgery date.  Surgery Date: 03/31/22 Preadmission Testing Date: 03/25/22 (phone 1p-5p)  Patient has been made aware to call [phone], between 1-3:00pm the day before surgery, to find out what time to arrive for surgery.

## 2022-03-18 ENCOUNTER — Telehealth: Payer: Self-pay

## 2022-03-18 ENCOUNTER — Other Ambulatory Visit
Admission: RE | Admit: 2022-03-18 | Discharge: 2022-03-18 | Disposition: A | Discharge status: Home / Self Care | Payer: Managed Care, Other (non HMO) | Attending: Surgery | Admitting: Surgery

## 2022-03-18 DIAGNOSIS — K8020 Calculus of gallbladder without cholecystitis without obstruction: Secondary | ICD-10-CM | POA: Insufficient documentation

## 2022-03-18 LAB — COMPREHENSIVE METABOLIC PANEL
ALT: 28 U/L (ref 0–44)
AST: 30 U/L (ref 15–41)
Albumin: 4.1 g/dL (ref 3.5–5.0)
Alkaline Phosphatase: 106 U/L (ref 38–126)
Anion gap: 10 (ref 5–15)
BUN: 9 mg/dL (ref 6–20)
CO2: 23 mmol/L (ref 22–32)
Calcium: 8.6 mg/dL — ABNORMAL LOW (ref 8.9–10.3)
Chloride: 105 mmol/L (ref 98–111)
Creatinine, Ser: 0.64 mg/dL (ref 0.44–1.00)
GFR, Estimated: >60 mL/min (ref >60)
Glucose, Bld: 100 mg/dL — ABNORMAL HIGH (ref 70–99)
Potassium: 3.6 mmol/L (ref 3.5–5.1)
Sodium: 138 mmol/L (ref 135–145)
Total Bilirubin: 0.5 mg/dL (ref 0.3–1.2)
Total Protein: 7.3 g/dL (ref 6.5–8.1)

## 2022-03-18 LAB — CBC WITH DIFFERENTIAL/PLATELET
Abs Immature Granulocytes: 0.02 10*3/uL (ref 0.00–0.07)
Basophils Absolute: 0 10*3/uL (ref 0.0–0.1)
Basophils Relative: 1 %
Eosinophils Absolute: 0.1 10*3/uL (ref 0.0–0.5)
Eosinophils Relative: 1 %
HCT: 41.4 % (ref 36.0–46.0)
Hemoglobin: 14.1 g/dL (ref 12.0–15.0)
Immature Granulocytes: 0 %
Lymphocytes Relative: 44 %
Lymphs Abs: 2.4 10*3/uL (ref 0.7–4.0)
MCH: 31.4 pg (ref 26.0–34.0)
MCHC: 34.1 g/dL (ref 30.0–36.0)
MCV: 92.2 fL (ref 80.0–100.0)
Monocytes Absolute: 0.3 10*3/uL (ref 0.1–1.0)
Monocytes Relative: 6 %
Neutro Abs: 2.6 10*3/uL (ref 1.7–7.7)
Neutrophils Relative %: 48 %
Platelets: 231 10*3/uL (ref 150–400)
RBC: 4.49 MIL/uL (ref 3.87–5.11)
RDW: 12.4 % (ref 11.5–15.5)
WBC: 5.5 10*3/uL (ref 4.0–10.5)
nRBC: 0 % (ref 0.0–0.2)

## 2022-03-18 NOTE — Telephone Encounter (Signed)
CBC, CMP ordered per Dr.Pabon -Patient notified.
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
## 2022-03-25 ENCOUNTER — Encounter
Admission: RE | Admit: 2022-03-25 | Discharge: 2022-03-25 | Disposition: A | Discharge status: Home / Self Care | Payer: Managed Care, Other (non HMO) | Source: Ambulatory Visit | Attending: Surgery | Admitting: Surgery

## 2022-03-25 ENCOUNTER — Other Ambulatory Visit: Payer: Self-pay

## 2022-03-25 DIAGNOSIS — Z01812 Encounter for preprocedural laboratory examination: Secondary | ICD-10-CM

## 2022-03-25 HISTORY — DX: Personal history of urinary calculi: Z87.442

## 2022-03-25 NOTE — Patient Instructions (Addendum)
Your procedure is scheduled on: 03/31/22 - Thursday Report to the Registration Desk on the 1st floor of the [facility]. To find out your arrival time, please call [phone] between 1PM - 3PM on: 03/30/22 - Wednesday If your arrival time is 6:00 am, do not arrive prior to that time as the [facility] entrance doors do not open until 6:00 am.  REMEMBER: Instructions that are not followed completely may result in serious medical risk, up to and including death; or upon the discretion of your surgeon and anesthesiologist your surgery may need to be rescheduled.  Do not eat food after midnight the night before surgery.  No gum chewing, lozengers or hard candies.  You may however, drink CLEAR liquids up to 2 hours before you are scheduled to arrive for your surgery. Do not drink anything within 2 hours of your scheduled arrival time.  Clear liquids include: - water  - apple juice without pulp - gatorade (not RED colors) - black coffee or tea (Do NOT add milk or creamers to the coffee or tea) Do NOT drink anything that is not on this list.  TAKE THESE MEDICATIONS THE MORNING OF SURGERY WITH A SIP OF WATER: - oxyCODONE-acetaminophen if needed.   One week prior to surgery: Stop Anti-inflammatories (NSAIDS) such as Advil, Aleve, Ibuprofen, Motrin, Naproxen, Naprosyn and Aspirin based products such as Excedrin, Goodys Powder, BC Powder.  Stop ANY OVER THE COUNTER supplements until after surgery.  You may however, continue to take Tylenol if needed for pain up until the day of surgery.  No Alcohol for 24 hours before or after surgery.  No Smoking including e-cigarettes for 24 hours prior to surgery.  No chewable tobacco products for at least 6 hours prior to surgery.  No nicotine patches on the day of surgery.  Do not use any "recreational" drugs for at least a week prior to your surgery.  Please be advised that the combination of cocaine and anesthesia may have negative outcomes,  up to and including death. If you test positive for cocaine, your surgery will be cancelled.  On the morning of surgery brush your teeth with toothpaste and water, you may rinse your mouth with mouthwash if you wish. Do not swallow any toothpaste or mouthwash.  Do not wear jewelry, make-up, hairpins, clips or nail polish.  Do not wear lotions, powders, or perfumes.   Do not shave body from the neck down 48 hours prior to surgery just in case you cut yourself which could leave a site for infection.  Also, freshly shaved skin may become irritated if using the CHG soap.  Contact lenses, hearing aids and dentures may not be worn into surgery.  Do not bring valuables to the hospital. [facility] is not responsible for any missing/lost belongings or valuables.   Notify your doctor if there is any change in your medical condition (cold, fever, infection).  Wear comfortable clothing (specific to your surgery type) to the hospital.  After surgery, you can help prevent lung complications by doing breathing exercises.  Take deep breaths and cough every 1-2 hours. Your doctor may order a device called an Incentive Spirometer to help you take deep breaths. When coughing or sneezing, hold a pillow firmly against your incision with both hands. This is called "splinting." Doing this helps protect your incision. It also decreases belly discomfort.  If you are being admitted to the hospital overnight, leave your suitcase in the car. After surgery it may be brought to  your room.  If you are being discharged the day of surgery, you will not be allowed to drive home. You will need a responsible adult (18 years or older) to drive you home and stay with you that night.   If you are taking public transportation, you will need to have a responsible adult (18 years or older) with you. Please confirm with your physician that it is acceptable to use public transportation.   Please call the Pre-admissions  Testing Dept. at [phone] if you have any questions about these instructions.  Surgery Visitation Policy:  Patients undergoing a surgery or procedure may have two family members or support persons with them as long as the person is not COVID-19 positive or experiencing its symptoms.   Inpatient Visitation:    Visiting hours are 7 a.m. to 8 p.m. Up to four visitors are allowed at one time in a patient room, including children. The visitors may rotate out with other people during the day. One designated support person (adult) may remain overnight.

## 2022-03-30 MED ORDER — CHLORHEXIDINE GLUCONATE CLOTH 2 % EX PADS: 6.0000 | MEDICATED_PAD | Freq: Once | CUTANEOUS | Status: DC

## 2022-03-30 MED ORDER — ACETAMINOPHEN 500 MG PO TABS: 1000.0000 mg | ORAL_TABLET | ORAL | Status: AC

## 2022-03-30 MED ORDER — CELECOXIB 200 MG PO CAPS: 200.0000 mg | ORAL_CAPSULE | ORAL | Status: AC

## 2022-03-30 MED ORDER — INDOCYANINE GREEN 25 MG IV SOLR
2.5000 mg | Freq: Once | INTRAVENOUS | Status: AC
  Administered 2022-03-31: 2.5 mg via INTRAVENOUS
  Filled 2022-03-30: qty 1

## 2022-03-30 MED ORDER — CEFAZOLIN SODIUM-DEXTROSE 2-4 GM/100ML-% IV SOLN
2.0000 g | INTRAVENOUS | Status: AC
  Administered 2022-03-31: 2 g via INTRAVENOUS

## 2022-03-30 MED ORDER — GABAPENTIN 300 MG PO CAPS: 300.0000 mg | ORAL_CAPSULE | ORAL | Status: AC

## 2022-03-30 MED ORDER — CHLORHEXIDINE GLUCONATE CLOTH 2 % EX PADS
6.0000 | MEDICATED_PAD | Freq: Once | CUTANEOUS | Status: AC
  Administered 2022-03-31: 6 via TOPICAL

## 2022-03-30 MED ORDER — LACTATED RINGERS IV SOLN: INTRAVENOUS | Status: DC

## 2022-03-30 MED ORDER — ORAL CARE MOUTH RINSE: 15.0000 mL | Freq: Once | OROMUCOSAL | Status: AC

## 2022-03-30 MED ORDER — CHLORHEXIDINE GLUCONATE 0.12 % MT SOLN: 15.0000 mL | Freq: Once | OROMUCOSAL | Status: AC

## 2022-03-30 MED ORDER — FAMOTIDINE 20 MG PO TABS: 20.0000 mg | ORAL_TABLET | Freq: Once | ORAL | Status: AC

## 2022-03-31 ENCOUNTER — Other Ambulatory Visit: Payer: Self-pay

## 2022-03-31 ENCOUNTER — Encounter
Admission: RE | Disposition: A | Discharge status: Home / Self Care | Payer: Self-pay | Source: Home / Self Care | Attending: Surgery

## 2022-03-31 ENCOUNTER — Encounter: Payer: Self-pay | Admitting: Surgery

## 2022-03-31 ENCOUNTER — Ambulatory Visit: Payer: Managed Care, Other (non HMO) | Admitting: Anesthesiology

## 2022-03-31 ENCOUNTER — Ambulatory Visit
Admission: RE | Admit: 2022-03-31 | Discharge: 2022-03-31 | Disposition: A | Discharge status: Home / Self Care | Payer: Managed Care, Other (non HMO) | Attending: Surgery | Admitting: Surgery

## 2022-03-31 DIAGNOSIS — F17200 Nicotine dependence, unspecified, uncomplicated: Secondary | ICD-10-CM | POA: Diagnosis not present

## 2022-03-31 DIAGNOSIS — K8010 Calculus of gallbladder with chronic cholecystitis without obstruction: Secondary | ICD-10-CM | POA: Insufficient documentation

## 2022-03-31 DIAGNOSIS — F32A Depression, unspecified: Secondary | ICD-10-CM | POA: Diagnosis not present

## 2022-03-31 DIAGNOSIS — Z01812 Encounter for preprocedural laboratory examination: Secondary | ICD-10-CM

## 2022-03-31 DIAGNOSIS — K8020 Calculus of gallbladder without cholecystitis without obstruction: Secondary | ICD-10-CM

## 2022-03-31 DIAGNOSIS — K8050 Calculus of bile duct without cholangitis or cholecystitis without obstruction: Secondary | ICD-10-CM | POA: Diagnosis not present

## 2022-03-31 DIAGNOSIS — I10 Essential (primary) hypertension: Secondary | ICD-10-CM | POA: Insufficient documentation

## 2022-03-31 LAB — POCT PREGNANCY, URINE: Preg Test, Ur: NEGATIVE

## 2022-03-31 SURGERY — CHOLECYSTECTOMY, ROBOT-ASSISTED, LAPAROSCOPIC
Anesthesia: General

## 2022-03-31 MED ORDER — FAMOTIDINE 20 MG PO TABS
ORAL_TABLET | ORAL | Status: AC
  Administered 2022-03-31: 20 mg via ORAL
  Filled 2022-03-31: qty 1

## 2022-03-31 MED ORDER — CHLORHEXIDINE GLUCONATE 0.12 % MT SOLN
OROMUCOSAL | Status: AC
  Administered 2022-03-31: 15 mL via OROMUCOSAL
  Filled 2022-03-31: qty 15

## 2022-03-31 MED ORDER — HYDROCODONE-ACETAMINOPHEN 5-325 MG PO TABS: 1.0000 | ORAL_TABLET | ORAL | 0 refills | Status: DC | PRN

## 2022-03-31 MED ORDER — GABAPENTIN 300 MG PO CAPS
ORAL_CAPSULE | ORAL | Status: AC
  Administered 2022-03-31: 300 mg via ORAL
  Filled 2022-03-31: qty 1

## 2022-03-31 MED ORDER — OXYCODONE HCL 5 MG PO TABS
5.0000 mg | ORAL_TABLET | Freq: Once | ORAL | Status: AC | PRN
  Administered 2022-03-31: 5 mg via ORAL

## 2022-03-31 MED ORDER — LACTATED RINGERS IV SOLN: INTRAVENOUS | Status: DC

## 2022-03-31 MED ORDER — ROCURONIUM BROMIDE 100 MG/10ML IV SOLN
INTRAVENOUS | Status: DC | PRN
  Administered 2022-03-31: 50 mg via INTRAVENOUS

## 2022-03-31 MED ORDER — PROPOFOL 10 MG/ML IV BOLUS
INTRAVENOUS | Status: DC | PRN
  Administered 2022-03-31: 150 mg via INTRAVENOUS

## 2022-03-31 MED ORDER — 0.9 % SODIUM CHLORIDE (POUR BTL) OPTIME
TOPICAL | Status: DC | PRN
  Administered 2022-03-31: 500 mL

## 2022-03-31 MED ORDER — PROPOFOL 10 MG/ML IV BOLUS
INTRAVENOUS | Status: AC
  Filled 2022-03-31: qty 20

## 2022-03-31 MED ORDER — FENTANYL CITRATE (PF) 100 MCG/2ML IJ SOLN
INTRAMUSCULAR | Status: AC
  Filled 2022-03-31: qty 2

## 2022-03-31 MED ORDER — OXYCODONE HCL 5 MG/5ML PO SOLN: 5.0000 mg | Freq: Once | ORAL | Status: AC | PRN

## 2022-03-31 MED ORDER — BUPIVACAINE-EPINEPHRINE (PF) 0.5% -1:200000 IJ SOLN
INTRAMUSCULAR | Status: AC
  Filled 2022-03-31: qty 30

## 2022-03-31 MED ORDER — SUGAMMADEX SODIUM 200 MG/2ML IV SOLN
INTRAVENOUS | Status: DC | PRN
  Administered 2022-03-31: 200 mg via INTRAVENOUS

## 2022-03-31 MED ORDER — ACETAMINOPHEN 10 MG/ML IV SOLN: 1000.0000 mg | Freq: Once | INTRAVENOUS | Status: DC | PRN

## 2022-03-31 MED ORDER — DEXAMETHASONE SODIUM PHOSPHATE 10 MG/ML IJ SOLN
INTRAMUSCULAR | Status: DC | PRN
  Administered 2022-03-31: 10 mg via INTRAVENOUS

## 2022-03-31 MED ORDER — ACETAMINOPHEN 500 MG PO TABS
ORAL_TABLET | ORAL | Status: AC
  Administered 2022-03-31: 1000 mg via ORAL
  Filled 2022-03-31: qty 2

## 2022-03-31 MED ORDER — MIDAZOLAM HCL 2 MG/2ML IJ SOLN
INTRAMUSCULAR | Status: AC
  Filled 2022-03-31: qty 2

## 2022-03-31 MED ORDER — ONDANSETRON HCL 4 MG/2ML IJ SOLN
INTRAMUSCULAR | Status: DC | PRN
  Administered 2022-03-31: 4 mg via INTRAVENOUS

## 2022-03-31 MED ORDER — MIDAZOLAM HCL 2 MG/2ML IJ SOLN
INTRAMUSCULAR | Status: DC | PRN
  Administered 2022-03-31: 2 mg via INTRAVENOUS

## 2022-03-31 MED ORDER — CELECOXIB 200 MG PO CAPS
ORAL_CAPSULE | ORAL | Status: AC
  Administered 2022-03-31: 200 mg via ORAL
  Filled 2022-03-31: qty 1

## 2022-03-31 MED ORDER — ONDANSETRON HCL 4 MG/2ML IJ SOLN: 4.0000 mg | Freq: Once | INTRAMUSCULAR | Status: DC | PRN

## 2022-03-31 MED ORDER — OXYCODONE HCL 5 MG PO TABS
ORAL_TABLET | ORAL | Status: AC
  Filled 2022-03-31: qty 1

## 2022-03-31 MED ORDER — DEXMEDETOMIDINE (PRECEDEX) IN NS 20 MCG/5ML (4 MCG/ML) IV SYRINGE
PREFILLED_SYRINGE | INTRAVENOUS | Status: DC | PRN
  Administered 2022-03-31 (×2): 8 ug via INTRAVENOUS
  Administered 2022-03-31: 4 ug via INTRAVENOUS

## 2022-03-31 MED ORDER — FENTANYL CITRATE (PF) 100 MCG/2ML IJ SOLN
INTRAMUSCULAR | Status: DC | PRN
Start: 2022-03-31 — End: 2022-03-31
  Administered 2022-03-31: 100 ug via INTRAVENOUS

## 2022-03-31 MED ORDER — BUPIVACAINE LIPOSOME 1.3 % IJ SUSP
INTRAMUSCULAR | Status: AC
  Filled 2022-03-31: qty 20

## 2022-03-31 MED ORDER — LIDOCAINE HCL (CARDIAC) PF 100 MG/5ML IV SOSY
PREFILLED_SYRINGE | INTRAVENOUS | Status: DC | PRN
  Administered 2022-03-31: 50 mg via INTRAVENOUS

## 2022-03-31 MED ORDER — BUPIVACAINE-EPINEPHRINE (PF) 0.5% -1:200000 IJ SOLN
INTRAMUSCULAR | Status: DC | PRN
  Administered 2022-03-31: 50 mL

## 2022-03-31 MED ORDER — CEFAZOLIN SODIUM-DEXTROSE 2-4 GM/100ML-% IV SOLN
INTRAVENOUS | Status: AC
  Filled 2022-03-31: qty 100

## 2022-03-31 MED ORDER — FENTANYL CITRATE (PF) 100 MCG/2ML IJ SOLN
INTRAMUSCULAR | Status: AC
  Administered 2022-03-31: 25 ug via INTRAVENOUS
  Filled 2022-03-31: qty 2

## 2022-03-31 MED ORDER — FENTANYL CITRATE (PF) 100 MCG/2ML IJ SOLN
25.0000 ug | INTRAMUSCULAR | Status: DC | PRN
  Administered 2022-03-31: 25 ug via INTRAVENOUS

## 2022-03-31 SURGICAL SUPPLY — 50 items
CANNULA REDUC XI 12-8 STAPL (CANNULA) ×2
CANNULA REDUCER 12-8 DVNC XI (CANNULA) ×1 IMPLANT
CATH REDDICK CHOLANGI 4FR 50CM (CATHETERS) IMPLANT
CLIP LIGATING HEMO O LOK GREEN (MISCELLANEOUS) ×2 IMPLANT
DERMABOND ADVANCED (GAUZE/BANDAGES/DRESSINGS) ×2
DERMABOND ADVANCED .7 DNX12 (GAUZE/BANDAGES/DRESSINGS) ×1 IMPLANT
DRAPE ARM DVNC X/XI (DISPOSABLE) ×4 IMPLANT
DRAPE COLUMN DVNC XI (DISPOSABLE) ×1 IMPLANT
DRAPE DA VINCI XI ARM (DISPOSABLE) ×8
DRAPE DA VINCI XI COLUMN (DISPOSABLE) ×2
ELECT CAUTERY BLADE 6.4 (BLADE) ×2 IMPLANT
ELECT REM PT RETURN 9FT ADLT (ELECTROSURGICAL) ×2
ELECTRODE REM PT RTRN 9FT ADLT (ELECTROSURGICAL) ×1 IMPLANT
GLOVE BIO SURGEON STRL SZ7 (GLOVE) ×4 IMPLANT
GOWN STRL REUS W/ TWL LRG LVL3 (GOWN DISPOSABLE) ×4 IMPLANT
GOWN STRL REUS W/TWL LRG LVL3 (GOWN DISPOSABLE) ×8
IRRIGATION STRYKERFLOW (MISCELLANEOUS) IMPLANT
IRRIGATOR STRYKERFLOW (MISCELLANEOUS)
IV CATH ANGIO 12GX3 LT BLUE (NEEDLE) IMPLANT
KIT PINK PAD W/HEAD ARE REST (MISCELLANEOUS) ×2
KIT PINK PAD W/HEAD ARM REST (MISCELLANEOUS) ×1 IMPLANT
LABEL OR SOLS (LABEL) ×2 IMPLANT
MANIFOLD NEPTUNE II (INSTRUMENTS) ×2 IMPLANT
NEEDLE HYPO 22GX1.5 SAFETY (NEEDLE) ×2 IMPLANT
NS IRRIG 500ML POUR BTL (IV SOLUTION) ×2 IMPLANT
OBTURATOR OPTICAL STANDARD 8MM (TROCAR) ×2
OBTURATOR OPTICAL STND 8 DVNC (TROCAR) ×1
OBTURATOR OPTICALSTD 8 DVNC (TROCAR) ×1 IMPLANT
PACK LAP CHOLECYSTECTOMY (MISCELLANEOUS) ×2 IMPLANT
PENCIL SMOKE EVACUATOR (MISCELLANEOUS) ×2 IMPLANT
SEAL CANN UNIV 5-8 DVNC XI (MISCELLANEOUS) ×3 IMPLANT
SEAL XI 5MM-8MM UNIVERSAL (MISCELLANEOUS) ×6
SET TUBE SMOKE EVAC HIGH FLOW (TUBING) ×2 IMPLANT
SOLUTION ELECTROLUBE (MISCELLANEOUS) ×2 IMPLANT
SPIKE FLUID TRANSFER (MISCELLANEOUS) ×2 IMPLANT
SPONGE T-LAP 18X18 ~~LOC~~+RFID (SPONGE) ×2 IMPLANT
SPONGE T-LAP 4X18 ~~LOC~~+RFID (SPONGE) IMPLANT
STAPLER CANNULA SEAL DVNC XI (STAPLE) ×1 IMPLANT
STAPLER CANNULA SEAL XI (STAPLE) ×2
STOPCOCK 3 WAY MALE LL (IV SETS)
STOPCOCK 3WAY MALE LL (IV SETS) IMPLANT
SUT MNCRL AB 4-0 PS2 18 (SUTURE) ×2 IMPLANT
SUT VICRYL 0 AB UR-6 (SUTURE) ×4 IMPLANT
SYR 20ML LL LF (SYRINGE) ×2 IMPLANT
SYR 30ML LL (SYRINGE) ×2 IMPLANT
SYS BAG RETRIEVAL 10MM (BASKET) ×2
SYSTEM BAG RETRIEVAL 10MM (BASKET) ×1 IMPLANT
TRAP FLUID SMOKE EVACUATOR (MISCELLANEOUS) ×2 IMPLANT
WATER STERILE IRR 3000ML UROMA (IV SOLUTION) IMPLANT
WATER STERILE IRR 500ML POUR (IV SOLUTION) ×2 IMPLANT

## 2022-03-31 NOTE — Anesthesia Procedure Notes (Signed)
Procedure Name: Intubation Date/Time: 03/31/2022 9:22 AM  Performed by: Cheral Bay, CRNAPre-anesthesia Checklist: Patient identified, Emergency Drugs available, Suction available and Patient being monitored Patient Re-evaluated:Patient Re-evaluated prior to induction Oxygen Delivery Method: Circle system utilized Preoxygenation: Pre-oxygenation with 100% oxygen Induction Type: IV induction Ventilation: Mask ventilation without difficulty Laryngoscope Size: McGraph and 3 Grade View: Grade I Tube type: Oral Tube size: 7.0 mm Number of attempts: 1 Airway Equipment and Method: Stylet Placement Confirmation: ETT inserted through vocal cords under direct vision, positive ETCO2 and breath sounds checked- equal and bilateral Secured at: 21 cm Tube secured with: Tape Dental Injury: Teeth and Oropharynx as per pre-operative assessment

## 2022-03-31 NOTE — Anesthesia Postprocedure Evaluation (Signed)
Anesthesia Post Note  Patient: Olivia Melendez  Procedure(s) Performed: XI ROBOTIC ASSISTED LAPAROSCOPIC CHOLECYSTECTOMY INDOCYANINE GREEN FLUORESCENCE IMAGING (ICG)  Patient location during evaluation: PACU Anesthesia Type: General Level of consciousness: awake and alert Pain management: pain level controlled Vital Signs Assessment: post-procedure vital signs reviewed and stable Respiratory status: spontaneous breathing, nonlabored ventilation, respiratory function stable and patient connected to nasal cannula oxygen Cardiovascular status: blood pressure returned to baseline and stable Postop Assessment: no apparent nausea or vomiting Anesthetic complications: no   No notable events documented.   Last Vitals:  Vitals:   03/31/22 1046 03/31/22 1108  BP: 130/87 (!) 140/85  Pulse: 62 63  Resp: 15 16  Temp: (!) 36.2 C 36.7 C  SpO2: 98% 100%    Last Pain:  Vitals:   03/31/22 1108  TempSrc: Temporal  PainSc: Asleep                 Cleda Mccreedy Adin Lariccia
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
## 2022-03-31 NOTE — Transfer of Care (Signed)
Immediate Anesthesia Transfer of Care Note  Patient: Olivia Melendez  Procedure(s) Performed: XI ROBOTIC ASSISTED LAPAROSCOPIC CHOLECYSTECTOMY INDOCYANINE GREEN FLUORESCENCE IMAGING (ICG)  Patient Location: PACU  Anesthesia Type:General  Level of Consciousness: awake  Airway & Oxygen Therapy: Patient Spontanous Breathing and Patient connected to face mask oxygen  Post-op Assessment: Report given to RN and Post -op Vital signs reviewed and stable  Post vital signs: Reviewed and stable  Last Vitals:  Vitals Value Taken Time  BP 129/81 03/31/22 1015  Temp    Pulse 79 03/31/22 1016  Resp 15 03/31/22 1016  SpO2 99 % 03/31/22 1016  Vitals shown include unvalidated device data.  Last Pain:  Vitals:   03/31/22 0811  TempSrc: Oral         Complications: No notable events documented.

## 2022-03-31 NOTE — Op Note (Signed)
Robotic assisted laparoscopic Cholecystectomy  Pre-operative Diagnosis: biliary colic  Post-operative Diagnosis: same  Procedure:  Robotic assisted laparoscopic Cholecystectomy  Surgeon: Sterling Big, MD FACS  Anesthesia: Gen. with endotracheal tube  Findings: Chronic mild Cholecystitis   Estimated Blood Loss: 5cc       Specimens: Gallbladder           Complications: none   Procedure Details  The patient was seen again in the Holding Room. The benefits, complications, treatment options, and expected outcomes were discussed with the patient. The risks of bleeding, infection, recurrence of symptoms, failure to resolve symptoms, bile duct damage, bile duct leak, retained common bile duct stone, bowel injury, any of which could require further surgery and/or ERCP, stent, or papillotomy were reviewed with the patient. The likelihood of improving the patient's symptoms with return to their baseline status is good.  The patient and/or family concurred with the proposed plan, giving informed consent.  The patient was taken to Operating Room, identified  and the procedure verified as Laparoscopic Cholecystectomy.  A Time Out was held and the above information confirmed.  Prior to the induction of general anesthesia, antibiotic prophylaxis was administered. VTE prophylaxis was in place. General endotracheal anesthesia was then administered and tolerated well. After the induction, the abdomen was prepped with Chloraprep and draped in the sterile fashion. The patient was positioned in the supine position.  Cut down technique was used to enter the abdominal cavity and a Hasson trochar was placed after two vicryl stitches were anchored to the fascia. Pneumoperitoneum was then created with CO2 and tolerated well without any adverse changes in the patient's vital signs.  Three 8-mm ports were placed under direct vision. All skin incisions  were infiltrated with a local anesthetic agent before making the  incision and placing the trocars.   The patient was positioned  in reverse Trendelenburg, robot was brought to the surgical field and docked in the standard fashion.  We made sure all the instrumentation was kept indirect view at all times and that there were no collision between the arms. I scrubbed out and went to the console.  The gallbladder was identified, the fundus grasped and retracted cephalad. Adhesions were lysed bluntly. The infundibulum was grasped and retracted laterally, exposing the peritoneum overlying the triangle of Calot. This was then divided and exposed in a blunt fashion. An extended critical view of the cystic duct and cystic artery was obtained.  The cystic duct was clearly identified and bluntly dissected.   Artery and duct were double clipped and divided. Using ICG cholangiography we visualize the cystic duct and CBD ,no evidence of bile injuries. The gallbladder was taken from the gallbladder fossa in a retrograde fashion with the electrocautery.  Hemostasis was achieved with the electrocautery. nspection of the right upper quadrant was performed. No bleeding, bile duct injury or leak, or bowel injury was noted. Robotic instruments and robotic arms were undocked in the standard fashion.  I scrubbed back in.  The gallbladder was removed and placed in an Endocatch bag.   Pneumoperitoneum was released.  The periumbilical port site was closed with interrumpted 0 Vicryl sutures. 4-0 subcuticular Monocryl was used to close the skin. Dermabond was  applied.  The patient was then extubated and brought to the recovery room in stable condition. Sponge, lap, and needle counts were correct at closure and at the conclusion of the case.               Sterling Big, MD, FACS

## 2022-03-31 NOTE — Interval H&P Note (Signed)
History and Physical Interval Note:  03/31/2022 8:53 AM  Olivia Melendez  has presented today for surgery, with the diagnosis of biliary colie.  The various methods of treatment have been discussed with the patient and family. After consideration of risks, benefits and other options for treatment, the patient has consented to  Procedure(s): XI ROBOTIC ASSISTED LAPAROSCOPIC CHOLECYSTECTOMY (N/A) INDOCYANINE GREEN FLUORESCENCE IMAGING (ICG) (N/A) as a surgical intervention.  The patient's history has been reviewed, patient examined, no change in status, stable for surgery.  I have reviewed the patient's chart and labs.  Questions were answered to the patient's satisfaction.     Adlyn Fife F Zuriah Bordas

## 2022-03-31 NOTE — Discharge Instructions (Addendum)
Laparoscopic Cholecystectomy, Care After  ° °These instructions give you information on caring for yourself after your procedure. Your doctor may also give you more specific instructions. Call your doctor if you have any problems or questions after your procedure.  °HOME CARE  °Change your bandages (dressings) as told by your doctor.  °Keep the wound dry and clean. Wash the wound gently with soap and water. Pat the wound dry with a clean towel.  °Do not take baths, swim, or use hot tubs for 2 weeks, or as told by your doctor.  °Only take medicine as told by your doctor.  °Eat a normal diet as told by your doctor.  °Do not lift anything heavier than 10 pounds (4.5 kg) until your doctor says it is okay.  °Do not play contact sports for 1 week, or as told by your doctor. °GET HELP IF:  °Your wound is red, puffy (swollen), or painful.  °You have yellowish-white fluid (pus) coming from the wound.  °You have fluid draining from the wound for more than 1 day.  °You have a bad smell coming from the wound.  °Your wound breaks open. °GET HELP RIGHT AWAY IF:  °You have trouble breathing.  °You have chest pain.  °You have a fever >101  °You have pain in the shoulders (shoulder strap areas) that is getting worse.  °You feel dizzy or pass out (faint).  °You have severe belly (abdominal) pain.  °You feel sick to your stomach (nauseous) or throw up (vomit) for more than 1 day. ° °AMBULATORY SURGERY  °DISCHARGE INSTRUCTIONS ° ° °The drugs that you were given will stay in your system until tomorrow so for the next 24 hours you should not: ° °Drive an automobile °Make any legal decisions °Drink any alcoholic beverage ° ° °You may resume regular meals tomorrow.  Today it is better to start with liquids and gradually work up to solid foods. ° °You may eat anything you prefer, but it is better to start with liquids, then soup and crackers, and gradually work up to solid foods. ° ° °Please notify your doctor immediately if you have any  unusual bleeding, trouble breathing, redness and pain at the surgery site, drainage, fever, or pain not relieved by medication. ° ° ° °Additional Instructions: °Please contact your physician with any problems or Same Day Surgery at [phone], Monday through Friday 6 am to 4 pm, or [place] at  Main number at [phone].  °  °

## 2022-04-01 LAB — SURGICAL PATHOLOGY

## 2022-04-14 ENCOUNTER — Encounter: Payer: Self-pay | Admitting: Physician Assistant

## 2022-04-14 ENCOUNTER — Ambulatory Visit (INDEPENDENT_AMBULATORY_CARE_PROVIDER_SITE_OTHER): Payer: Managed Care, Other (non HMO) | Admitting: Physician Assistant

## 2022-04-14 VITALS — BP 148/87 | HR 73 | Temp 98.0°F | Ht 63.0 in | Wt 171.0 lb

## 2022-04-14 DIAGNOSIS — Z09 Encounter for follow-up examination after completed treatment for conditions other than malignant neoplasm: Secondary | ICD-10-CM

## 2022-04-14 DIAGNOSIS — K8020 Calculus of gallbladder without cholecystitis without obstruction: Secondary | ICD-10-CM

## 2022-04-14 DIAGNOSIS — K8010 Calculus of gallbladder with chronic cholecystitis without obstruction: Secondary | ICD-10-CM

## 2022-04-14 NOTE — Progress Notes (Signed)
[facility] POST-OP OFFICE VISIT  04/14/2022  HPI: Olivia Melendez is a 41 y.o. female 14 days s/p robotic assisted laparoscopic cholecystectomy for biliary colic with Dr Everlene Farrier  She is doing well She had about 5-7 days of discomfort and then got better quickly; now pain free No fever, chills, nausea, emesis, or diarrhea Tolerating PO without issues No issues with incisions No other complaints   Vital signs: BP (!) 148/87   Pulse 73   Temp 98 F (36.7 C)   Ht 5\' 3"  (1.6 m)   Wt 171 lb (77.6 kg)   SpO2 99%   BMI 30.29 kg/m    Physical Exam: Constitutional: Well appearing female, NAD Abdomen: Soft, non-tender, non-distended, no rebound/guarding Skin: Laparoscopic incisions are healing well, no erythema or drainage   Assessment/Plan: This is a 41 y.o. female 14 days s/p robotic assisted laparoscopic cholecystectomy for biliary colic with Dr 20   - Pain control prn  - Reviewed wound care recommendation  - Reviewed lifting restrictions; 4 weeks total  - Reviewed surgical pathology; CCC  - She can follow up on as needed basis; She understands to call with questions/concerns  -- Everlene Farrier, PA-C [facility] 04/14/2022, 2:11 PM M-F: 7am - 4pm

## 2022-04-14 NOTE — Patient Instructions (Signed)

## 2022-08-04 ENCOUNTER — Other Ambulatory Visit: Payer: Self-pay | Admitting: Orthopedic Surgery

## 2022-08-04 DIAGNOSIS — M25551 Pain in right hip: Secondary | ICD-10-CM

## 2022-08-17 ENCOUNTER — Other Ambulatory Visit: Payer: Self-pay | Admitting: Orthopedic Surgery

## 2022-08-17 DIAGNOSIS — M25551 Pain in right hip: Secondary | ICD-10-CM

## 2022-09-13 ENCOUNTER — Ambulatory Visit
Admission: RE | Admit: 2022-09-13 | Discharge: 2022-09-13 | Disposition: A | Discharge status: Home / Self Care | Payer: Managed Care, Other (non HMO) | Source: Ambulatory Visit | Attending: Orthopedic Surgery | Admitting: Orthopedic Surgery

## 2022-09-13 ENCOUNTER — Ambulatory Visit
Admission: RE | Admit: 2022-09-13 | Discharge: 2022-09-13 | Disposition: A | Discharge status: Home / Self Care | Payer: Managed Care, Other (non HMO) | Source: Ambulatory Visit | Attending: Orthopedic Surgery

## 2022-09-13 DIAGNOSIS — M25551 Pain in right hip: Secondary | ICD-10-CM

## 2022-09-13 DIAGNOSIS — M25552 Pain in left hip: Secondary | ICD-10-CM | POA: Diagnosis not present

## 2022-09-13 DIAGNOSIS — R600 Localized edema: Secondary | ICD-10-CM | POA: Diagnosis not present

## 2022-09-13 MED ORDER — IOPAMIDOL (ISOVUE-M 200) INJECTION 41%
10.0000 mL | Freq: Once | INTRAMUSCULAR | Status: AC
  Administered 2022-09-13: 10 mL via INTRA_ARTICULAR
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
## 2022-11-23 ENCOUNTER — Ambulatory Visit (HOSPITAL_COMMUNITY)
Admission: EM | Admit: 2022-11-23 | Discharge: 2022-11-23 | Disposition: A | Discharge status: Home / Self Care | Payer: 59 | Attending: Family | Admitting: Family

## 2022-11-23 DIAGNOSIS — F4322 Adjustment disorder with anxiety: Secondary | ICD-10-CM | POA: Diagnosis not present

## 2022-11-23 MED ORDER — ESCITALOPRAM OXALATE 10 MG PO TABS
20.0000 mg | ORAL_TABLET | Freq: Every day | ORAL | 0 refills | Status: AC
Start: 2022-11-23 — End: ?

## 2022-11-23 NOTE — Progress Notes (Signed)
   11/23/22 1524  [facility] Triage Screening (Walk-ins at [facility] only)  What Is the Reason for Your Visit/Call Today? Patient is a 42 y.o. female with a hx of depression and anxiety.  She presents today requesting to speak with a provider about possibly getting a refill of her Lexapro (10mg  BID).  She recently moved from [facility] to [place] and found a new PCP, however they can't see her until June.  They mentioned that she could go to the walk in clinic at [facility].  Providers at this clinic recommended patient present to a mental health clinic.  She states she googled clinics and found [facility]. She is requesting  short term refill until she is able to follow up.  She states she has taken some of her boyfriend's antidepressant medication, however did not find it very helpful.  Patient denies SI.  She has a hx of an attempt in 2022 and was admitted to [facility].  No other inpt admissions and no other hx of attempts.  Patient denies HI, AVH or SA hx.  How Long Has This Been Causing You Problems? 1-6 months  Have You Recently Had Any Thoughts About Hurting Yourself? No  Are You Planning to Commit Suicide/Harm Yourself At This time? No  Have you Recently Had Thoughts About Hurting Someone [name]? No  Are You Planning To Harm Someone At This Time? No  Are you currently experiencing any auditory, visual or other hallucinations? No  Have You Used Any Alcohol or Drugs in the Past 24 Hours? No  Do you have any current medical co-morbidities that require immediate attention? No  Clinician description of patient physical appearance/behavior: Patient is calm, cooperative, AAOx5  What Do You Feel Would Help You the Most Today? Medication(s)  If access to [facility] was not available, would you have sought care in the Emergency Department? No  Determination of Need Routine (7 days)  Options For Referral Medication Management;Outpatient Therapy

## 2022-11-23 NOTE — Discharge Instructions (Addendum)
OBS Care Management   Outpatient Services for Therapy and Medication Management  Based on what you have shared, a list of resources for outpatient therapy and psychiatry is provided below to get you started back on treatment.  It is imperative that you follow through with treatment within 5-7 days from the day of discharge to prevent any further risk to your safety or mental well-being.  You are not limited to the list provided.  In case of an urgent crisis, you may contact the Mobile Crisis Unit with [facility] at [phone].        [facility] [address] [place], [place], [address] [phone] phone [phone] phone ([address], Anthem/Elevance, [address], [place], [place], [employer], Healthy [place], [place], [place], [address], [employer], Slana, UHC, American Financial, [place], Out of Network)  [employer], [place] [address] [place], [place], [address] [phone] phone ([place], Anthem/Elevance, [employer] Options/Carelon, [address], [address], [address] [place] [address], [place], [place], [place], [place], Medicare, [place], [place], [place], [facility])  [facility] [address] [place], [place], [address] [phone] phone [phone] fax ([address] [place] , [place], [place], [place] Health Choice, UHC, General Electric, Self-Pay)  [facility] [address] [place], [place], [address] [phone] phone ([address], Anthem/Elevance, [address], [address] [place] [address], [place], [employer], [place], [place], [place], [employer], [place], [place], [place], [facility])  [facility] - 6-8 MONTH WAIT FOR THERAPY; [facility] FOR MEDICATION MANAGEMENT [address] [place], [place], [address] [address] phone ([address], AmeriHealth [place] [facility] [place], [address], [place], [place], Friday Health Plans, [address], BCBS Healthy [place], [place], [employer],  [place], [place], [place], [place], Tricare, [facility], [facility] [place] Federal[employer]Mogul, [facility])  [facility] [address] [place], [place], [address] [phone] phone  [facility] [address] [place], [place], [address] [phone] phone [phone] fax  [employer] [phone] [address] [place], [place], [address] [phone] phone      Patient is instructed prior to discharge to:  Take all medications as prescribed by his/her mental healthcare provider. Report any adverse effects and or reactions from the medicines to his/her outpatient provider promptly. Keep all scheduled appointments, to ensure that you are getting refills on time and to avoid any interruption in your medication.  If you are unable to keep an appointment call to reschedule.  Be sure to follow-up with resources and follow-up appointments provided.  Patient has been instructed & cautioned: To not engage in alcohol and or illegal drug use while on prescription medicines. In the event of worsening symptoms, patient is instructed to call the crisis hotline, 911 and or go to the nearest ED for appropriate evaluation and treatment of symptoms. To follow-up with his/her primary care provider for your other medical issues, concerns and or health care needs.  Information: -National Suicide Prevention Lifeline 1-800-SUICIDE or 585-109-0702.  -988 offers 24/7 access to trained crisis counselors who can help people experiencing mental health-related distress. People can call or text 988 or chat 988lifeline.org for themselves or if they are worried about a loved one who may need crisis support.

## 2022-11-23 NOTE — ED Notes (Signed)
Discharge instructions provided and Pt stated understanding. Pt alert, orient and ambulatory prior to d/c from facility. Personal belongings returned. Safety maintained.  

## 2022-11-23 NOTE — Progress Notes (Signed)
   11/23/22 1524  [facility] Triage Screening (Walk-ins at [facility] only)  How Did You Hear About [place]? Self  What Is the Reason for Your Visit/Call Today? Patient is a 42 y.o. female with a hx of depression and anxiety.  She presents today requesting to speak with a provider about possibly getting a refill of her Lexapro (10mg  BID).  She recently moved from [facility] to [place] and found a new PCP, however they can't see her until June.  They mentioned that she could go to the walk in clinic at [facility].  Providers at this clinic recommended patient present to a mental health clinic.  She states she googled clinics and found [facility]. She is requesting  short term refill until she is able to follow up.  She states she has taken some of her boyfriend's antidepressant medication, however did not find it very helpful.  Patient denies SI.  She has a hx of an attempt in 2022 and was admitted to [facility].  No other inpt admissions and no other hx of attempts.  Patient denies HI, AVH or SA hx.  How Long Has This Been Causing You Problems? 1-6 months  Have You Recently Had Any Thoughts About Hurting Yourself? No  Are You Planning to Commit Suicide/Harm Yourself At This time? No  Have you Recently Had Thoughts About Hurting Someone [name]? No  Are You Planning To Harm Someone At This Time? No  Are you currently experiencing any auditory, visual or other hallucinations? No  Have You Used Any Alcohol or Drugs in the Past 24 Hours? No  Do you have any current medical co-morbidities that require immediate attention? No  Clinician description of patient physical appearance/behavior: Patient is calm, cooperative, AAOx5  What Do You Feel Would Help You the Most Today? Medication(s)  If access to [facility] Urgent Care was not available, would you have sought care in the Emergency Department? No  Determination of Need Routine (7 days)  Options For Referral Medication Management;Outpatient Therapy

## 2022-11-23 NOTE — ED Provider Notes (Signed)
Behavioral Health Urgent Care Medical Screening Exam  Patient Name: Olivia Melendez MRN: [id number] Date of Evaluation: 11/23/22 Chief Complaint:   Diagnosis:  Final diagnoses:  Adjustment disorder with anxious mood    History of Present illness: Olivia Melendez is a 42 y.o. female. Patient presents voluntarily to [facility] behavioral health for walk-in assessment.   Patient is assessed, face-to-face, by nurse practitioner. She is seated in assessment area, no acute distress. Consulted with provider, Dr.  Lucianne Muss, and chart reviewed on 11/23/2022. She  is alert and oriented, pleasant and cooperative during assessment.   Olivia Melendez seeking refill of escitalopram.  She would like to be linked with outpatient psychiatry.    She endorses history of anxiety and depression.  Most recently stable on a combination of medications including escitalopram and trazodone.  Currently switching primary care physicians and ran out of of escitalopram. She is not linked with outpatient psychiatry currently.    Recent stressors include moving in with her boyfriend in December.  She reports her boyfriend's home is "cluttered and unorganized, I think I may have OCD."  She has made the decision to reside with her mother and stepfather currently.  Patient  presents with euthymic mood, congruent affect. She  denies suicidal and homicidal ideations.  Patient easily  contracts verbally for safety with this Clinical research associate.  Olivia Melendez endorses history of 2 previous suicide attempts in 2022.  1 previous inpatient psychiatric hospitalization, also in 2022.  No history of nonsuicidal self-harm behavior.  Patient has normal speech and behavior.  She  denies auditory and visual hallucinations.  Patient is able to converse coherently with goal-directed thoughts and no distractibility or preoccupation.  Denies symptoms of paranoia.  Objectively there is no evidence of psychosis/mania or delusional thinking.  Olivia Melendez resides in [place] with her mother and stepfather. She denies access to weapons. She is employed in the Development worker, international aid. Patient endorses average sleep and appetite.  She endorses alcohol use approximately 1 time per week.  She ingests an average of 6-12 twelve ounce beers during each episode of alcohol use.  She denies substance use aside from alcohol.  Patient offered support and encouragement. She declines any person to contact for collateral information.   Patient educated and verbalizes understanding of mental health resources and other crisis services in the community. They are instructed to call 911 and present to the nearest emergency room should patient experience any suicidal/homicidal ideation, auditory/visual/hallucinations, or detrimental worsening of mental health condition.      Flowsheet Row Admission (Discharged) from 03/31/2022 in [facility] Pre-Admission Testing 45 from 03/25/2022 in [facility] PRE ADMISSION TESTING ED from 03/10/2022 in [facility] Emergency Department at [facility]  C-SSRS RISK CATEGORY No Risk No Risk No Risk       Psychiatric Specialty Exam  Presentation  General Appearance:Appropriate for Environment; Casual  Eye Contact:Good  Speech:Clear and Coherent; Normal Rate  Speech Volume:Normal  Handedness:Right   Mood and Affect  Mood: Euthymic  Affect: Congruent; Appropriate   Thought Process  Thought Processes: Coherent; Goal Directed; Linear  Descriptions of Associations:Intact  Orientation:Full (Time, Place and Person)  Thought Content:Logical; WDL  Diagnosis of Schizophrenia or Schizoaffective disorder in past: No data recorded  Hallucinations:None  Ideas of Reference:None  Suicidal Thoughts:No  Homicidal Thoughts:No   Sensorium  Memory: Immediate Good; Recent Good  Judgment: Good  Insight: Good   Executive Functions   Concentration: Good  Attention Span: Good  Recall: Good  Fund of Knowledge: Good  Language: Good   Psychomotor Activity  Psychomotor Activity: Normal   Assets  Assets: Communication Skills; Desire for Improvement; Financial Resources/Insurance; Housing; Resilience; Social Support   Sleep  Sleep: Fair  Number of hours: No data recorded  Physical Exam: Physical Exam Vitals and nursing note reviewed.  Constitutional:      Appearance: Normal appearance. She is well-developed and normal weight.  HENT:     Head: Normocephalic and atraumatic.     Nose: Nose normal.  Cardiovascular:     Rate and Rhythm: Normal rate.  Pulmonary:     Effort: Pulmonary effort is normal.  Musculoskeletal:        General: Normal range of motion.     Cervical back: Normal range of motion.  Skin:    General: Skin is warm and dry.  Neurological:     Mental Status: She is alert and oriented to person, place, and time.  Psychiatric:        Attention and Perception: Attention and perception normal.        Mood and Affect: Mood and affect normal.        Speech: Speech normal.        Behavior: Behavior normal. Behavior is cooperative.        Thought Content: Thought content normal.        Cognition and Memory: Cognition and memory normal.    Review of Systems  Constitutional: Negative.   HENT: Negative.    Eyes: Negative.   Respiratory: Negative.    Cardiovascular: Negative.   Gastrointestinal: Negative.   Genitourinary: Negative.   Musculoskeletal: Negative.   Skin: Negative.   Neurological: Negative.   Psychiatric/Behavioral: Negative.     Blood pressure (!) 135/90, pulse 76, temperature 98 F (36.7 C), temperature source Oral, resp. rate 18, SpO2 100 %. There is no height or weight on file to calculate BMI.  Musculoskeletal: Strength & Muscle Tone: within normal limits Gait & Station: normal Patient leans: N/A   [facility] MSE Discharge Disposition for Follow up and  Recommendations: Based on my evaluation the patient does not appear to have an emergency medical condition and can be discharged with resources and follow up care in outpatient services for Medication Management and Individual Therapy Follow-up with outpatient psychiatry, resources provided. Continue current medications including:  -Escitalopram 20 mg daily/mood (refill provided) -Trazodone 50 mg nightly as needed/sleep  Lenard Lance, FNP 11/23/2022, 6:58 PM

## 2022-12-22 ENCOUNTER — Telehealth: Payer: Self-pay

## 2022-12-22 NOTE — Telephone Encounter (Signed)
..  Patient declines further follow up and engagement by the Managed Medicaid Team. Appropriate care team members and provider have been notified via electronic communication. The Managed Medicaid Team is available to follow up with the patient after provider conversation with the patient regarding recommendation for engagement and subsequent re-referral to the Managed Medicaid Team.     Timeka Goette Care Guide  Medicaid Managed  Care Guide [place]  [phone]  

## 2023-01-17 DIAGNOSIS — N912 Amenorrhea, unspecified: Secondary | ICD-10-CM | POA: Diagnosis not present

## 2023-01-19 DIAGNOSIS — N912 Amenorrhea, unspecified: Secondary | ICD-10-CM | POA: Diagnosis not present

## 2023-01-31 DIAGNOSIS — N911 Secondary amenorrhea: Secondary | ICD-10-CM | POA: Diagnosis not present

## 2023-02-10 LAB — OB RESULTS CONSOLE RUBELLA ANTIBODY, IGM: Rubella: IMMUNE

## 2023-02-10 LAB — OB RESULTS CONSOLE HEPATITIS B SURFACE ANTIGEN: Hepatitis B Surface Ag: NEGATIVE

## 2023-02-10 LAB — OB RESULTS CONSOLE ANTIBODY SCREEN: Antibody Screen: NEGATIVE

## 2023-02-10 LAB — HEPATITIS C ANTIBODY: HCV Ab: NEGATIVE

## 2023-02-10 LAB — OB RESULTS CONSOLE RPR: RPR: NONREACTIVE

## 2023-02-10 LAB — OB RESULTS CONSOLE HIV ANTIBODY (ROUTINE TESTING): HIV: NONREACTIVE

## 2023-02-20 LAB — OB RESULTS CONSOLE GC/CHLAMYDIA
Chlamydia: NEGATIVE
Neisseria Gonorrhea: NEGATIVE

## 2023-03-15 DIAGNOSIS — J029 Acute pharyngitis, unspecified: Secondary | ICD-10-CM | POA: Diagnosis not present

## 2023-03-15 DIAGNOSIS — Z03818 Encounter for observation for suspected exposure to other biological agents ruled out: Secondary | ICD-10-CM | POA: Diagnosis not present

## 2023-03-15 DIAGNOSIS — Z3A13 13 weeks gestation of pregnancy: Secondary | ICD-10-CM | POA: Diagnosis not present

## 2023-03-15 DIAGNOSIS — R1312 Dysphagia, oropharyngeal phase: Secondary | ICD-10-CM | POA: Diagnosis not present

## 2023-04-17 ENCOUNTER — Encounter (HOSPITAL_COMMUNITY): Payer: Self-pay | Admitting: *Deleted

## 2023-04-17 ENCOUNTER — Inpatient Hospital Stay (HOSPITAL_COMMUNITY): Payer: Managed Care, Other (non HMO)

## 2023-04-17 ENCOUNTER — Inpatient Hospital Stay (HOSPITAL_COMMUNITY)
Admission: AD | Admit: 2023-04-17 | Discharge: 2023-04-17 | Disposition: A | Discharge status: Home / Self Care | Payer: Managed Care, Other (non HMO) | Attending: Obstetrics & Gynecology | Admitting: Obstetrics & Gynecology

## 2023-04-17 DIAGNOSIS — Z3A18 18 weeks gestation of pregnancy: Secondary | ICD-10-CM

## 2023-04-17 DIAGNOSIS — O26892 Other specified pregnancy related conditions, second trimester: Secondary | ICD-10-CM | POA: Diagnosis not present

## 2023-04-17 DIAGNOSIS — O4692 Antepartum hemorrhage, unspecified, second trimester: Secondary | ICD-10-CM

## 2023-04-17 DIAGNOSIS — O09522 Supervision of elderly multigravida, second trimester: Secondary | ICD-10-CM | POA: Diagnosis not present

## 2023-04-17 DIAGNOSIS — O4690 Antepartum hemorrhage, unspecified, unspecified trimester: Secondary | ICD-10-CM

## 2023-04-17 DIAGNOSIS — O209 Hemorrhage in early pregnancy, unspecified: Secondary | ICD-10-CM | POA: Insufficient documentation

## 2023-04-17 DIAGNOSIS — O208 Other hemorrhage in early pregnancy: Secondary | ICD-10-CM

## 2023-04-17 DIAGNOSIS — N760 Acute vaginitis: Secondary | ICD-10-CM

## 2023-04-17 LAB — URINALYSIS, ROUTINE W REFLEX MICROSCOPIC
Bilirubin Urine: NEGATIVE
Glucose, UA: NEGATIVE mg/dL
Hgb urine dipstick: NEGATIVE
Ketones, ur: NEGATIVE mg/dL
Leukocytes,Ua: NEGATIVE
Nitrite: NEGATIVE
Protein, ur: NEGATIVE mg/dL
Specific Gravity, Urine: 1.005 (ref 1.005–1.030)
pH: 7 (ref 5.0–8.0)

## 2023-04-17 LAB — WET PREP, GENITAL
Sperm: NONE SEEN
Trich, Wet Prep: NONE SEEN
WBC, Wet Prep HPF POC: >=10 — AB (ref <10)
Yeast Wet Prep HPF POC: NONE SEEN

## 2023-04-17 MED ORDER — METRONIDAZOLE 500 MG PO TABS: 500.0000 mg | ORAL_TABLET | Freq: Two times a day (BID) | ORAL | 0 refills | Status: DC

## 2023-04-17 NOTE — MAU Provider Note (Signed)
None     Chief Complaint:  Vaginal Bleeding and Abdominal Pain   Olivia Melendez is  42 y.o. G3P1011 at [redacted]w[redacted]d presents complaining of Vaginal Bleeding and Abdominal Pain .   Per RN:  started bleeding this morning.  Saw bright when she wiped and some in her shorts.  Light cramping in lower stomach, saw blood again about ago.  This is her 1st episode of bleeding, no [place] since early in preg.   Has been having loose, watery diarrhea about after she eats, happens every time last 2-3 wks. None in between meals Onset of complaint: this morning Pain score: 5  Obstetrical/Gynecological History: OB History     Gravida  3   Para  1   Term  1   Preterm  0   AB  1   Living  1      SAB  0   IAB  0   Ectopic  1   Multiple      Live Births  1          Past Medical History: Past Medical History:  Diagnosis Date   Anxiety    Chiari malformation type I (HCC) followed at [facility] (neurologist/ oncologist)   06-07-2012  at [facility]  s/p  supocciptal crainectomy chiari decompression   Chiari malformation type I (HCC)    Chronic headaches    intermitant occiptal headaches , hx chiari malformation   Depression    Endometriosis    History of ectopic pregnancy    08/ 2017   treated w/ methotexrate   History of endometriosis    History of kidney stones    Hypertension    06-06-2016 per pt has taken not medication, diamox, for past several months due to finances no insurance in between jobs/  03-31-2017 per pt has appt w/ pcp to take something else that can be taken while preg.   Hypertension    IIH (idiopathic intracranial hypertension)    per pt dx 2014 --  approx. every 6 months gets spinal tap at [facility]  , last one 08-19-2015   Infertility, female    Nicotine dependence    Pelvic adhesions    Phimosis    left fallopian tube   Polyp of fallopian tube    ostia tubal polyp    Past Surgical History: Past Surgical History:  Procedure Laterality Date    BLADDER REPAIR  07/30/2018   Procedure: OPEN CYSTOTOMY REPAIR;  Surgeon: Sebastian Ache, MD;  Location: [facility] OR;  Service: Urology;;   CHOLECYSTECTOMY     CHROMOPERTUBATION Bilateral 06/06/2016   Procedure: CHROMOPERTUBATION;  Surgeon: Mitchel Honour, DO;  Location: [facility];  Service: Gynecology;  Laterality: Bilateral;   CRANIECTOMY SUBOCCIPITAL W/ CERVICAL LAMINECTOMY / CHIARI  06/07/2012   Decompression w/ resection posterior arch of C1 (chiari malformation Type 1)    CRANIECTOMY SUBOCCIPITAL W/ CERVICAL LAMINECTOMY / CHIARI  2013   DX LAPAROSCOPY W/  ABLATION ENDOMETRIOSIS  2001 and 2006   EXTERNAL FIXATION REMOVAL Bilateral 08/02/2018   Procedure: REMOVAL EXTERNAL FIXATION PELVIS;  Surgeon: Roby Lofts, MD;  Location: [facility] OR;  Service: Orthopedics;  Laterality: Bilateral;   HARDWARE REMOVAL Bilateral 04/12/2019   Procedure: HARDWARE REMOVAL PELVIS;  Surgeon: Roby Lofts, MD;  Location: [facility] OR;  Service: Orthopedics;  Laterality: Bilateral;   HYSTEROSCOPY     LAPAROSCOPY N/A 06/06/2016   Procedure: LAPAROSCOPY DIAGNOSTIC with possible removal of endometriosis;  Surgeon: Mitchel Honour, DO;  Location: [facility];  Service: Gynecology;  Laterality: N/A;   LAPAROSCOPY Bilateral 04/11/2017   Procedure: hysteroscopy lysis of adhesions, incision of uterine septum, suction D and C, and bilateral proximal tubal recannulization LAPAROSCOPy, excision of endometriosis and chromotubation;  Surgeon: Fermin Schwab, MD;  Location: [facility];  Service: Gynecology;  Laterality: Bilateral;   LAPAROSCOPY     for endometriosis    ORIF PELVIC FRACTURE N/A 08/02/2018   Procedure: OPEN REDUCTION INTERNAL FIXATION (ORIF) PELVIC FRACTURE;  Surgeon: Roby Lofts, MD;  Location: [facility] OR;  Service: Orthopedics;  Laterality: N/A;   ORIF PELVIC FRACTURE WITH PERCUTANEOUS SCREWS N/A 07/30/2018   Procedure: I&D and EXTERNAL FIXATION OF PELVIS;  Surgeon: Roby Lofts, MD;  Location: [facility] OR;  Service: Orthopedics;  Laterality: N/A;   SACRO-ILIAC PINNING Bilateral 08/02/2018   Procedure: SACRO-ILIAC PINNING;  Surgeon: Roby Lofts, MD;  Location: [facility] OR;  Service: Orthopedics;  Laterality: Bilateral;   SPINAL PUNCTURE LUMBAR DIAG ([facility] HX)  last one at [facility] 08-19-2015   for chiari occiptal headaches   TONSILLECTOMY  child   WISDOM TOOTH EXTRACTION  1994    Family History: Family History  Problem Relation Age of Onset   Multiple sclerosis Mother    Diabetes Mother    Heart attack Mother    High blood pressure Father     Social History: Social History   Tobacco Use   Smoking status: Every Day    Current packs/day: 1.00    Average packs/day: 1 pack/day for 17.0 years (17.0 ttl pk-yrs)    Types: Cigarettes   Smokeless tobacco: Never  Vaping Use   Vaping status: Never Used  Substance Use Topics   Alcohol use: Yes    Comment: 3-4 drinks a week   Drug use: Never    Allergies: No Known Allergies  Meds:  No medications prior to admission.    Review of Systems   Constitutional: Negative for fever and chills Eyes: Negative for visual disturbances Respiratory: Negative for shortness of breath, dyspnea Cardiovascular: Negative for chest pain or palpitations  Gastrointestinal: Negative for vomiting, diarrhea and constipation Genitourinary: Negative for dysuria and urgency Musculoskeletal: Negative for back pain, joint pain, myalgias.  Normal ROM  Neurological: Negative for dizziness and headaches    Physical Exam  Blood pressure 124/73, pulse 85, temperature 97.9 F (36.6 C), temperature source Oral, resp. rate 17, height 5\' 4"  (1.626 m), weight 180 lb 3.2 oz (81.7 kg), SpO2 98%. GENERAL: Well-developed, well-nourished female in no acute distress.  LUNGS: Normal respiratory effort HEART: Regular rate and rhythm. ABDOMEN: Soft, nontender, nondistended, gravid. FH 155 PELVIC:  normal appearing DC w/o odor, cx non friable.  No  blood whatsoever in vagina EXTREMITIES: Nontender, no edema, 2+ distal pulses. DTR's 2+   Labs: No results found for this or any previous visit (from the past 24 hour(s)). Imaging Studies:  [place] MFM OB LIMITED  Result Date: 04/17/2023 ----------------------------------------------------------------------  OBSTETRICS REPORT                       (Signed Final 04/17/2023 07:26 pm) ---------------------------------------------------------------------- Patient Info  ID #:       [id number]                          D.O.B.:  [birth year] (41 yrs)  Name:       Olivia Melendez  Visit Date: 04/17/2023 07:13 pm ---------------------------------------------------------------------- Performed By  Attending:        Noralee Space MD        Ref. Address:      [address]                                                              [place]                                                              [id number]  Performed By:     Marcellina Millin          Secondary Phy.:    [facility] MAU/Triage                    RDMS  Referred By:      Scarlette Calico                [facility]                    [facility] ---------------------------------------------------------------------- Orders  #  Description                           Code        Ordered By  1  [place] MFM OB LIMITED                     36644.03    Jacklyn Shell ----------------------------------------------------------------------  #  Order #                     Accession #                Episode #  1  [id number]                   [id number]                 [id number] ---------------------------------------------------------------------- Indications  Vaginal bleeding in pregnancy, second           O46.92  trimester  [redacted] weeks gestation of pregnancy                 Z3A.18  ---------------------------------------------------------------------- Fetal Evaluation  Num Of Fetuses:          1  Fetal Heart Rate(bpm):  150  Cardiac Activity:        Observed  Presentation:            Cephalic  Placenta:                Posterior  P. Cord Insertion:       Visualized  Amniotic Fluid  AFI FV:      Within normal limits                              Largest Pocket(cm)                              4.3  Comment:    No placental abruption or previa identified. ---------------------------------------------------------------------- OB History  Gravidity:    3         Term:   1        Prem:   0        SAB:   1  TOP:          0       Ectopic:  0        Living: 1 ---------------------------------------------------------------------- Gestational Age  Best:          18w 1d     Det. By:  Marcella Dubs         EDD:   09/17/23 ---------------------------------------------------------------------- Anatomy  Cranium:               Appears normal         Stomach:                Appears normal, left                                                                        sided  Ventricles:            Appears normal         Kidneys:                Appear normal  Diaphragm:             Appears normal         Bladder:                Appears normal ---------------------------------------------------------------------- Cervix Uterus Adnexa  Cervix  Length:            3.2  cm.  Normal appearance by transabdominal scan  Uterus  No abnormality visualized.  Right Ovary  Within normal limits.  Left Ovary  Not visualized.  Adnexa  No abnormality visualized ---------------------------------------------------------------------- Impression  Patient was evaluated for c/o vaginal bleeding .  A limited ultrasound study was performed .Amniotic fluid is  normal and good fetal activity is seen .Placenta appears  normal with no evidence of previa. No evidence of placental  abruption. Ultrasound has limitations in diagnosing placental   abruption. ----------------------------------------------------------------------                 Noralee Space, MD Electronically Signed Final Report   04/17/2023 07:26 pm ----------------------------------------------------------------------    Assessment: Joelly Butcher is  42 y.o. [phone]  at [redacted]w[redacted]d presents with 2nd trimester bleeding X1, resolved.   + Clue cells, but DC appears normal w/o odor, cx non friable and pt doesn't have any vaginal sx, so does not meet criteria for bacterial vaginosis. .  Plan: DC home F/U for any further problems  Jacklyn Shell 9/5/20241:38 PM

## 2023-04-17 NOTE — MAU Note (Signed)
Olivia Melendez is a 42 y.o. at [redacted]w[redacted]d here in MAU reporting: started bleeding this morning.  Saw bright when she wiped and some in her shorts.  Light cramping in lower stomach, saw blood again about ago.  This is her 1st episode of bleeding, no [place] since early in preg.   Has been having loose, watery diarrhea about after she eats, happens every time last 2-3 wks Onset of complaint: this morning Pain score: 5 Vitals:   04/17/23 1722  BP: 131/85  Pulse: 97  Resp: 17  Temp: 97.9 F (36.6 C)  SpO2: 98%     FHT:152 Lab orders placed from triage:  UA

## 2023-04-20 LAB — GC/CHLAMYDIA PROBE AMP (~~LOC~~) NOT AT ARMC
Chlamydia: NEGATIVE
Comment: NEGATIVE
Comment: NORMAL
Neisseria Gonorrhea: NEGATIVE

## 2023-04-27 DIAGNOSIS — Z3400 Encounter for supervision of normal first pregnancy, unspecified trimester: Secondary | ICD-10-CM | POA: Diagnosis not present

## 2023-04-27 DIAGNOSIS — Z3A19 19 weeks gestation of pregnancy: Secondary | ICD-10-CM | POA: Diagnosis not present

## 2023-04-27 DIAGNOSIS — Z363 Encounter for antenatal screening for malformations: Secondary | ICD-10-CM | POA: Diagnosis not present

## 2023-05-02 DIAGNOSIS — I10 Essential (primary) hypertension: Secondary | ICD-10-CM | POA: Diagnosis not present

## 2023-05-02 DIAGNOSIS — F321 Major depressive disorder, single episode, moderate: Secondary | ICD-10-CM | POA: Diagnosis not present

## 2023-06-15 DIAGNOSIS — Z3482 Encounter for supervision of other normal pregnancy, second trimester: Secondary | ICD-10-CM | POA: Diagnosis not present

## 2023-06-15 DIAGNOSIS — Z23 Encounter for immunization: Secondary | ICD-10-CM | POA: Diagnosis not present

## 2023-06-15 DIAGNOSIS — Z3A26 26 weeks gestation of pregnancy: Secondary | ICD-10-CM | POA: Diagnosis not present

## 2023-06-15 DIAGNOSIS — O10012 Pre-existing essential hypertension complicating pregnancy, second trimester: Secondary | ICD-10-CM | POA: Diagnosis not present

## 2023-06-23 DIAGNOSIS — M7918 Myalgia, other site: Secondary | ICD-10-CM | POA: Diagnosis not present

## 2023-06-23 DIAGNOSIS — F32A Depression, unspecified: Secondary | ICD-10-CM | POA: Diagnosis not present

## 2023-06-23 DIAGNOSIS — F419 Anxiety disorder, unspecified: Secondary | ICD-10-CM | POA: Diagnosis not present

## 2023-06-23 DIAGNOSIS — G935 Compression of brain: Secondary | ICD-10-CM | POA: Diagnosis not present

## 2023-06-23 DIAGNOSIS — G4486 Cervicogenic headache: Secondary | ICD-10-CM | POA: Diagnosis not present

## 2023-06-23 DIAGNOSIS — Z3A27 27 weeks gestation of pregnancy: Secondary | ICD-10-CM | POA: Diagnosis not present

## 2023-07-12 DIAGNOSIS — Z3483 Encounter for supervision of other normal pregnancy, third trimester: Secondary | ICD-10-CM | POA: Diagnosis not present

## 2023-07-12 DIAGNOSIS — O24410 Gestational diabetes mellitus in pregnancy, diet controlled: Secondary | ICD-10-CM | POA: Diagnosis not present

## 2023-07-12 DIAGNOSIS — Z3A30 30 weeks gestation of pregnancy: Secondary | ICD-10-CM | POA: Diagnosis not present

## 2023-07-26 DIAGNOSIS — I10 Essential (primary) hypertension: Secondary | ICD-10-CM | POA: Diagnosis not present

## 2023-08-01 DIAGNOSIS — O169 Unspecified maternal hypertension, unspecified trimester: Secondary | ICD-10-CM | POA: Diagnosis not present

## 2023-08-01 DIAGNOSIS — O24419 Gestational diabetes mellitus in pregnancy, unspecified control: Secondary | ICD-10-CM | POA: Diagnosis not present

## 2023-08-01 DIAGNOSIS — Z3A33 33 weeks gestation of pregnancy: Secondary | ICD-10-CM | POA: Diagnosis not present

## 2023-08-01 DIAGNOSIS — Z3483 Encounter for supervision of other normal pregnancy, third trimester: Secondary | ICD-10-CM | POA: Diagnosis not present

## 2023-08-02 DIAGNOSIS — D508 Other iron deficiency anemias: Secondary | ICD-10-CM | POA: Diagnosis not present

## 2023-08-02 DIAGNOSIS — F331 Major depressive disorder, recurrent, moderate: Secondary | ICD-10-CM | POA: Diagnosis not present

## 2023-08-02 DIAGNOSIS — I10 Essential (primary) hypertension: Secondary | ICD-10-CM | POA: Diagnosis not present

## 2023-08-10 DIAGNOSIS — Z3A34 34 weeks gestation of pregnancy: Secondary | ICD-10-CM | POA: Diagnosis not present

## 2023-08-10 DIAGNOSIS — O10013 Pre-existing essential hypertension complicating pregnancy, third trimester: Secondary | ICD-10-CM | POA: Diagnosis not present

## 2023-08-10 DIAGNOSIS — Z3400 Encounter for supervision of normal first pregnancy, unspecified trimester: Secondary | ICD-10-CM | POA: Diagnosis not present

## 2023-08-17 DIAGNOSIS — O169 Unspecified maternal hypertension, unspecified trimester: Secondary | ICD-10-CM | POA: Diagnosis not present

## 2023-08-17 DIAGNOSIS — Z3A35 35 weeks gestation of pregnancy: Secondary | ICD-10-CM | POA: Diagnosis not present

## 2023-08-17 DIAGNOSIS — O24419 Gestational diabetes mellitus in pregnancy, unspecified control: Secondary | ICD-10-CM | POA: Diagnosis not present

## 2023-08-23 ENCOUNTER — Other Ambulatory Visit: Payer: Self-pay

## 2023-08-23 ENCOUNTER — Encounter (HOSPITAL_COMMUNITY): Payer: Self-pay | Admitting: Obstetrics and Gynecology

## 2023-08-23 ENCOUNTER — Inpatient Hospital Stay (HOSPITAL_COMMUNITY): Payer: Managed Care, Other (non HMO) | Admitting: Anesthesiology

## 2023-08-23 ENCOUNTER — Encounter (HOSPITAL_COMMUNITY): Payer: Self-pay

## 2023-08-23 ENCOUNTER — Inpatient Hospital Stay (HOSPITAL_COMMUNITY)
Admission: AD | Admit: 2023-08-23 | Discharge: 2023-08-27 | DRG: 788 | Disposition: A | Discharge status: Home / Self Care | Payer: Managed Care, Other (non HMO) | Attending: Obstetrics & Gynecology | Admitting: Obstetrics & Gynecology

## 2023-08-23 ENCOUNTER — Encounter (HOSPITAL_COMMUNITY)
Admission: AD | Disposition: A | Discharge status: Home / Self Care | Payer: Self-pay | Source: Home / Self Care | Attending: Obstetrics & Gynecology

## 2023-08-23 ENCOUNTER — Telehealth (HOSPITAL_COMMUNITY): Payer: Self-pay | Admitting: *Deleted

## 2023-08-23 DIAGNOSIS — O99334 Smoking (tobacco) complicating childbirth: Secondary | ICD-10-CM | POA: Diagnosis present

## 2023-08-23 DIAGNOSIS — Z8781 Personal history of (healed) traumatic fracture: Secondary | ICD-10-CM

## 2023-08-23 DIAGNOSIS — R519 Headache, unspecified: Secondary | ICD-10-CM | POA: Diagnosis not present

## 2023-08-23 DIAGNOSIS — Z87442 Personal history of urinary calculi: Secondary | ICD-10-CM

## 2023-08-23 DIAGNOSIS — O1414 Severe pre-eclampsia complicating childbirth: Secondary | ICD-10-CM | POA: Diagnosis not present

## 2023-08-23 DIAGNOSIS — O114 Pre-existing hypertension with pre-eclampsia, complicating childbirth: Principal | ICD-10-CM | POA: Diagnosis present

## 2023-08-23 DIAGNOSIS — O9952 Diseases of the respiratory system complicating childbirth: Secondary | ICD-10-CM | POA: Diagnosis present

## 2023-08-23 DIAGNOSIS — Z3483 Encounter for supervision of other normal pregnancy, third trimester: Secondary | ICD-10-CM | POA: Diagnosis not present

## 2023-08-23 DIAGNOSIS — O1092 Unspecified pre-existing hypertension complicating childbirth: Secondary | ICD-10-CM | POA: Diagnosis not present

## 2023-08-23 DIAGNOSIS — O9962 Diseases of the digestive system complicating childbirth: Secondary | ICD-10-CM | POA: Diagnosis not present

## 2023-08-23 DIAGNOSIS — K219 Gastro-esophageal reflux disease without esophagitis: Secondary | ICD-10-CM | POA: Diagnosis not present

## 2023-08-23 DIAGNOSIS — F17210 Nicotine dependence, cigarettes, uncomplicated: Secondary | ICD-10-CM | POA: Diagnosis not present

## 2023-08-23 DIAGNOSIS — O1494 Unspecified pre-eclampsia, complicating childbirth: Secondary | ICD-10-CM

## 2023-08-23 DIAGNOSIS — J09X2 Influenza due to identified novel influenza A virus with other respiratory manifestations: Secondary | ICD-10-CM | POA: Diagnosis not present

## 2023-08-23 DIAGNOSIS — O26893 Other specified pregnancy related conditions, third trimester: Secondary | ICD-10-CM

## 2023-08-23 DIAGNOSIS — Z3A36 36 weeks gestation of pregnancy: Secondary | ICD-10-CM

## 2023-08-23 DIAGNOSIS — O24420 Gestational diabetes mellitus in childbirth, diet controlled: Secondary | ICD-10-CM | POA: Diagnosis present

## 2023-08-23 DIAGNOSIS — Z8249 Family history of ischemic heart disease and other diseases of the circulatory system: Secondary | ICD-10-CM | POA: Diagnosis not present

## 2023-08-23 DIAGNOSIS — O1493 Unspecified pre-eclampsia, third trimester: Principal | ICD-10-CM

## 2023-08-23 DIAGNOSIS — Z3A39 39 weeks gestation of pregnancy: Secondary | ICD-10-CM

## 2023-08-23 DIAGNOSIS — Z833 Family history of diabetes mellitus: Secondary | ICD-10-CM | POA: Diagnosis not present

## 2023-08-23 DIAGNOSIS — Z3685 Encounter for antenatal screening for Streptococcus B: Secondary | ICD-10-CM | POA: Diagnosis not present

## 2023-08-23 DIAGNOSIS — O24410 Gestational diabetes mellitus in pregnancy, diet controlled: Secondary | ICD-10-CM | POA: Diagnosis not present

## 2023-08-23 DIAGNOSIS — O10013 Pre-existing essential hypertension complicating pregnancy, third trimester: Secondary | ICD-10-CM | POA: Diagnosis not present

## 2023-08-23 LAB — CBC
HCT: 33.5 % — ABNORMAL LOW (ref 36.0–46.0)
Hemoglobin: 11.3 g/dL — ABNORMAL LOW (ref 12.0–15.0)
MCH: 30.6 pg (ref 26.0–34.0)
MCHC: 33.7 g/dL (ref 30.0–36.0)
MCV: 90.8 fL (ref 80.0–100.0)
Platelets: 173 10*3/uL (ref 150–400)
RBC: 3.69 MIL/uL — ABNORMAL LOW (ref 3.87–5.11)
RDW: 13.3 % (ref 11.5–15.5)
WBC: 8.2 10*3/uL (ref 4.0–10.5)
nRBC: 0 % (ref 0.0–0.2)

## 2023-08-23 LAB — COMPREHENSIVE METABOLIC PANEL
ALT: 12 U/L (ref 0–44)
AST: 30 U/L (ref 15–41)
Albumin: 2.9 g/dL — ABNORMAL LOW (ref 3.5–5.0)
Alkaline Phosphatase: 129 U/L — ABNORMAL HIGH (ref 38–126)
Anion gap: 14 (ref 5–15)
BUN: <5 mg/dL — ABNORMAL LOW (ref 6–20)
CO2: 20 mmol/L — ABNORMAL LOW (ref 22–32)
Calcium: 8.9 mg/dL (ref 8.9–10.3)
Chloride: 103 mmol/L (ref 98–111)
Creatinine, Ser: 0.57 mg/dL (ref 0.44–1.00)
GFR, Estimated: >60 mL/min (ref >60)
Glucose, Bld: 77 mg/dL (ref 70–99)
Potassium: 3.5 mmol/L (ref 3.5–5.1)
Sodium: 137 mmol/L (ref 135–145)
Total Bilirubin: 0.5 mg/dL (ref 0.0–1.2)
Total Protein: 6.5 g/dL (ref 6.5–8.1)

## 2023-08-23 LAB — URINALYSIS, ROUTINE W REFLEX MICROSCOPIC
Bilirubin Urine: NEGATIVE
Glucose, UA: NEGATIVE mg/dL
Hgb urine dipstick: NEGATIVE
Ketones, ur: >80 mg/dL — AB
Leukocytes,Ua: NEGATIVE
Nitrite: NEGATIVE
Protein, ur: 30 mg/dL — AB
Specific Gravity, Urine: 1.02 (ref 1.005–1.030)
pH: 6.5 (ref 5.0–8.0)

## 2023-08-23 LAB — URINALYSIS, MICROSCOPIC (REFLEX)

## 2023-08-23 LAB — RESP PANEL BY RT-PCR (RSV, FLU A&B, COVID)  RVPGX2
Influenza A by PCR: POSITIVE — AB
Influenza B by PCR: NEGATIVE
Resp Syncytial Virus by PCR: NEGATIVE
SARS Coronavirus 2 by RT PCR: NEGATIVE

## 2023-08-23 LAB — ABO/RH: ABO/RH(D): AB POS

## 2023-08-23 LAB — PREPARE RBC (CROSSMATCH)

## 2023-08-23 SURGERY — Surgical Case
Anesthesia: Spinal

## 2023-08-23 MED ORDER — ONDANSETRON HCL 4 MG/2ML IJ SOLN
INTRAMUSCULAR | Status: AC
  Filled 2023-08-23: qty 2

## 2023-08-23 MED ORDER — PROCHLORPERAZINE EDISYLATE 10 MG/2ML IJ SOLN
10.0000 mg | Freq: Once | INTRAMUSCULAR | Status: AC
  Administered 2023-08-23: 10 mg via INTRAVENOUS
  Filled 2023-08-23: qty 2

## 2023-08-23 MED ORDER — NIFEDIPINE 10 MG PO CAPS: 20.0000 mg | ORAL_CAPSULE | ORAL | Status: DC | PRN

## 2023-08-23 MED ORDER — MAGNESIUM SULFATE 40 GM/1000ML IV SOLN
INTRAVENOUS | Status: AC
  Filled 2023-08-23: qty 1000

## 2023-08-23 MED ORDER — LABETALOL HCL 5 MG/ML IV SOLN
40.0000 mg | INTRAVENOUS | Status: DC | PRN
  Administered 2023-08-27: 40 mg via INTRAVENOUS
  Filled 2023-08-23: qty 8

## 2023-08-23 MED ORDER — LACTATED RINGERS IV SOLN: INTRAVENOUS | Status: DC

## 2023-08-23 MED ORDER — OXYCODONE HCL 5 MG/5ML PO SOLN: 5.0000 mg | Freq: Once | ORAL | Status: DC | PRN

## 2023-08-23 MED ORDER — KETOROLAC TROMETHAMINE 30 MG/ML IJ SOLN
INTRAMUSCULAR | Status: AC
  Filled 2023-08-23: qty 1

## 2023-08-23 MED ORDER — FENTANYL CITRATE (PF) 100 MCG/2ML IJ SOLN
25.0000 ug | INTRAMUSCULAR | Status: DC | PRN
  Administered 2023-08-23: 50 ug via INTRAVENOUS

## 2023-08-23 MED ORDER — MAGNESIUM SULFATE BOLUS VIA INFUSION
4.0000 g | Freq: Once | INTRAVENOUS | Status: AC
  Administered 2023-08-23: 4 g via INTRAVENOUS
  Filled 2023-08-23: qty 1000

## 2023-08-23 MED ORDER — FENTANYL CITRATE (PF) 100 MCG/2ML IJ SOLN
INTRAMUSCULAR | Status: AC
  Filled 2023-08-23: qty 2

## 2023-08-23 MED ORDER — MAGNESIUM SULFATE 40 GM/1000ML IV SOLN
2.0000 g/h | INTRAVENOUS | Status: AC
  Administered 2023-08-24 (×2): 2 g/h via INTRAVENOUS
  Filled 2023-08-23: qty 1000

## 2023-08-23 MED ORDER — OXYTOCIN-SODIUM CHLORIDE 30-0.9 UT/500ML-% IV SOLN
INTRAVENOUS | Status: AC
  Filled 2023-08-23: qty 500

## 2023-08-23 MED ORDER — BUPIVACAINE IN DEXTROSE 0.75-8.25 % IT SOLN
INTRATHECAL | Status: DC | PRN
  Administered 2023-08-23: 1.6 mL via INTRATHECAL

## 2023-08-23 MED ORDER — PHENYLEPHRINE HCL-NACL 20-0.9 MG/250ML-% IV SOLN
INTRAVENOUS | Status: AC
  Filled 2023-08-23: qty 250

## 2023-08-23 MED ORDER — SOD CITRATE-CITRIC ACID 500-334 MG/5ML PO SOLN
30.0000 mL | Freq: Once | ORAL | Status: AC
Start: 2023-08-23 — End: 2023-08-23
  Administered 2023-08-23: 30 mL via ORAL
  Filled 2023-08-23: qty 30

## 2023-08-23 MED ORDER — OXYTOCIN-SODIUM CHLORIDE 30-0.9 UT/500ML-% IV SOLN
INTRAVENOUS | Status: DC | PRN
  Administered 2023-08-23: 30 [IU] via INTRAVENOUS

## 2023-08-23 MED ORDER — MORPHINE SULFATE (PF) 0.5 MG/ML IJ SOLN
INTRAMUSCULAR | Status: AC
  Filled 2023-08-23: qty 10

## 2023-08-23 MED ORDER — HYDRALAZINE HCL 20 MG/ML IJ SOLN: 10.0000 mg | INTRAMUSCULAR | Status: DC | PRN

## 2023-08-23 MED ORDER — DIPHENHYDRAMINE HCL 50 MG/ML IJ SOLN
25.0000 mg | INTRAMUSCULAR | Status: AC
  Administered 2023-08-23: 25 mg via INTRAVENOUS
  Filled 2023-08-23: qty 1

## 2023-08-23 MED ORDER — LABETALOL HCL 5 MG/ML IV SOLN
20.0000 mg | INTRAVENOUS | Status: DC | PRN
  Administered 2023-08-26: 20 mg via INTRAVENOUS
  Filled 2023-08-23: qty 4

## 2023-08-23 MED ORDER — LABETALOL HCL 5 MG/ML IV SOLN
20.0000 mg | INTRAVENOUS | Status: DC | PRN
  Filled 2023-08-23: qty 4

## 2023-08-23 MED ORDER — SODIUM CHLORIDE 0.9 % IR SOLN
Status: DC | PRN
  Administered 2023-08-23: 1

## 2023-08-23 MED ORDER — MORPHINE SULFATE (PF) 0.5 MG/ML IJ SOLN
INTRAMUSCULAR | Status: DC | PRN
  Administered 2023-08-23: 150 ug via INTRATHECAL

## 2023-08-23 MED ORDER — LABETALOL HCL 5 MG/ML IV SOLN: 40.0000 mg | INTRAVENOUS | Status: DC | PRN

## 2023-08-23 MED ORDER — LABETALOL HCL 5 MG/ML IV SOLN
40.0000 mg | INTRAVENOUS | Status: DC | PRN
  Administered 2023-08-23: 40 mg via INTRAVENOUS
  Filled 2023-08-23: qty 8

## 2023-08-23 MED ORDER — PHENYLEPHRINE HCL-NACL 20-0.9 MG/250ML-% IV SOLN
INTRAVENOUS | Status: DC | PRN
  Administered 2023-08-23: 60 ug/min via INTRAVENOUS

## 2023-08-23 MED ORDER — ACETAMINOPHEN-CAFFEINE 500-65 MG PO TABS
2.0000 | ORAL_TABLET | Freq: Once | ORAL | Status: AC
Start: 2023-08-23 — End: 2023-08-23
  Administered 2023-08-23: 2 via ORAL
  Filled 2023-08-23: qty 2

## 2023-08-23 MED ORDER — LACTATED RINGERS IV SOLN
INTRAVENOUS | Status: AC
  Administered 2023-08-23: 1000 mL via INTRAVENOUS

## 2023-08-23 MED ORDER — ACETAMINOPHEN 10 MG/ML IV SOLN
INTRAVENOUS | Status: AC
  Filled 2023-08-23: qty 100

## 2023-08-23 MED ORDER — LABETALOL HCL 5 MG/ML IV SOLN: 80.0000 mg | INTRAVENOUS | Status: DC | PRN

## 2023-08-23 MED ORDER — ONDANSETRON HCL 4 MG/2ML IJ SOLN
INTRAMUSCULAR | Status: DC | PRN
  Administered 2023-08-23: 4 mg via INTRAVENOUS

## 2023-08-23 MED ORDER — CEFAZOLIN SODIUM-DEXTROSE 2-3 GM-%(50ML) IV SOLR
INTRAVENOUS | Status: DC | PRN
  Administered 2023-08-23: 2 g via INTRAVENOUS

## 2023-08-23 MED ORDER — DEXAMETHASONE SODIUM PHOSPHATE 10 MG/ML IJ SOLN
INTRAMUSCULAR | Status: DC | PRN
  Administered 2023-08-23: 10 mg via INTRAVENOUS

## 2023-08-23 MED ORDER — LABETALOL HCL 5 MG/ML IV SOLN
20.0000 mg | INTRAVENOUS | Status: DC | PRN
  Administered 2023-08-23: 20 mg via INTRAVENOUS
  Filled 2023-08-23: qty 4

## 2023-08-23 MED ORDER — OXYCODONE HCL 5 MG PO TABS: 5.0000 mg | ORAL_TABLET | Freq: Once | ORAL | Status: DC | PRN

## 2023-08-23 MED ORDER — KETOROLAC TROMETHAMINE 30 MG/ML IJ SOLN
30.0000 mg | Freq: Once | INTRAMUSCULAR | Status: AC | PRN
  Administered 2023-08-23: 30 mg via INTRAVENOUS

## 2023-08-23 MED ORDER — NIFEDIPINE 10 MG PO CAPS: 10.0000 mg | ORAL_CAPSULE | ORAL | Status: DC | PRN

## 2023-08-23 MED ORDER — FENTANYL CITRATE (PF) 100 MCG/2ML IJ SOLN
INTRAMUSCULAR | Status: DC | PRN
  Administered 2023-08-23: 50 ug via INTRATHECAL
  Administered 2023-08-23: 100 ug via INTRATHECAL
  Administered 2023-08-23: 15 ug via INTRATHECAL

## 2023-08-23 MED ORDER — LABETALOL HCL 5 MG/ML IV SOLN
80.0000 mg | INTRAVENOUS | Status: DC | PRN
Start: 2023-08-23 — End: 2023-08-27
  Administered 2023-08-27: 80 mg via INTRAVENOUS
  Filled 2023-08-23: qty 16

## 2023-08-23 MED ORDER — ACETAMINOPHEN 10 MG/ML IV SOLN
1000.0000 mg | Freq: Once | INTRAVENOUS | Status: DC | PRN
  Administered 2023-08-23: 1000 mg via INTRAVENOUS

## 2023-08-23 MED ORDER — LACTATED RINGERS IV BOLUS
1000.0000 mL | Freq: Once | INTRAVENOUS | Status: AC
  Administered 2023-08-23: 1000 mL via INTRAVENOUS

## 2023-08-23 MED ORDER — DEXAMETHASONE SODIUM PHOSPHATE 10 MG/ML IJ SOLN
INTRAMUSCULAR | Status: AC
  Filled 2023-08-23: qty 1

## 2023-08-23 SURGICAL SUPPLY — 31 items
BENZOIN TINCTURE PRP APPL 2/3 (GAUZE/BANDAGES/DRESSINGS) ×1 IMPLANT
CHLORAPREP W/TINT 26 (MISCELLANEOUS) ×2 IMPLANT
CLAMP UMBILICAL CORD (MISCELLANEOUS) ×1 IMPLANT
CLOTH BEACON ORANGE TIMEOUT ST (SAFETY) ×1 IMPLANT
DERMABOND ADVANCED .7 DNX12 (GAUZE/BANDAGES/DRESSINGS) IMPLANT
DRSG OPSITE POSTOP 4X10 (GAUZE/BANDAGES/DRESSINGS) ×1 IMPLANT
ELECT REM PT RETURN 9FT ADLT (ELECTROSURGICAL) ×1
ELECTRODE REM PT RTRN 9FT ADLT (ELECTROSURGICAL) ×1 IMPLANT
EXTRACTOR VACUUM KIWI (MISCELLANEOUS) IMPLANT
GAUZE SPONGE 4X4 12PLY STRL LF (GAUZE/BANDAGES/DRESSINGS) IMPLANT
GLOVE BIO SURGEON STRL SZ 6 (GLOVE) ×1 IMPLANT
GLOVE BIOGEL PI IND STRL 6 (GLOVE) ×2 IMPLANT
GLOVE BIOGEL PI IND STRL 7.0 (GLOVE) ×1 IMPLANT
GOWN STRL REUS W/TWL LRG LVL3 (GOWN DISPOSABLE) ×2 IMPLANT
KIT ABG SYR 3ML LUER SLIP (SYRINGE) ×1 IMPLANT
NDL HYPO 25X5/8 SAFETYGLIDE (NEEDLE) ×1 IMPLANT
NEEDLE HYPO 25X5/8 SAFETYGLIDE (NEEDLE) ×1 IMPLANT
NS IRRIG 1000ML POUR BTL (IV SOLUTION) ×1 IMPLANT
PACK C SECTION WH (CUSTOM PROCEDURE TRAY) ×1 IMPLANT
PAD OB MATERNITY 4.3X12.25 (PERSONAL CARE ITEMS) ×1 IMPLANT
STRIP CLOSURE SKIN 1/2X4 (GAUZE/BANDAGES/DRESSINGS) IMPLANT
SUT CHROMIC 0 CTX 36 (SUTURE) ×3 IMPLANT
SUT MON AB 2-0 CT1 27 (SUTURE) ×1 IMPLANT
SUT PDS AB 0 CT1 27 (SUTURE) IMPLANT
SUT PLAIN 0 NONE (SUTURE) IMPLANT
SUT VIC AB 0 CT1 36 (SUTURE) IMPLANT
SUT VIC AB 0 CTX36XBRD ANBCTRL (SUTURE) IMPLANT
SUT VIC AB 4-0 KS 27 (SUTURE) IMPLANT
TOWEL OR 17X24 6PK STRL BLUE (TOWEL DISPOSABLE) ×1 IMPLANT
TRAY FOLEY W/BAG SLVR 14FR LF (SET/KITS/TRAYS/PACK) IMPLANT
WATER STERILE IRR 1000ML POUR (IV SOLUTION) ×1 IMPLANT
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
## 2023-08-23 NOTE — Progress Notes (Signed)
 Pt and support person notified of positive flu result. Pt and support person verbalize understanding to report any new onset of symptoms.   Per NICU charge nurse, FOB may continue to visit baby tonight. Pt may visit in morning. Both must wear masks while on unit. Both verbalize understanding that visitation may become limited in future if signs or symptoms of sickness present.  Droplet precautions initiated.
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
## 2023-08-23 NOTE — MAU Note (Signed)
 Kendyll Huettner is a 43 y.o. at [redacted]w[redacted]d here in MAU reporting: sent from [facility] office for BP evaluation and HA.  Reports she's had a HA since last night, has taken Tylenol  3x today without relief, last taken @ 1400.  Denies epigastric pain and visual disturbances, endorses dizziness/light headed. Denies VB or LOF.  Endorses +FM.  LMP: NA Onset of complaint: last night Pain score: 8 Vitals:   08/23/23 1842  BP: (!) 170/92  Pulse: 95  Temp: 99 F (37.2 C)  SpO2: 98%     FHT:156 bpm Lab orders placed from triage: UA

## 2023-08-23 NOTE — Telephone Encounter (Signed)
 Preadmission screen

## 2023-08-23 NOTE — Op Note (Signed)
 PROCEDURE DATE: 08/23/23   PREOPERATIVE DIAGNOSIS: Super-imposed pre-eclampsia History pelvic fracture   POSTOPERATIVE DIAGNOSIS: The same   PROCEDURE:    Primary Low Transverse Cesarean Section  Retrofill bladder  SURGEON:  Dr. Kelly Milian  ASSISTANT: Dr. Gabriela  An experienced assistant was required given the standard of surgical care given the complexity of the case.  This assistant was needed for exposure, dissection, suctioning, retraction, instrument exchange, assisting with delivery with administration of fundal pressure, and for overall help during the procedure.    INDICATIONS: This is a 42yo G3P1011 at 36.3 wga requiring cesarean section secondary to SIPE.  Presented with BP elevation requiring IV Labetalol  20/40.  Decision made to proceed with LTCS. The risks of cesarean section discussed with the patient included but were not limited to: bleeding which may require transfusion or reoperation; infection which may require antibiotics; injury to bowel, bladder, ureters or other surrounding organs; injury to the fetus; need for additional procedures including hysterectomy in the event of a life-threatening hemorrhage; placental abnormalities wth subsequent pregnancies, incisional problems, thromboembolic phenomenon and other postoperative/anesthesia complications. The patient agreed with the proposed plan, giving informed consent for the procedure.     FINDINGS:  Viable female infant in vertex presentation, APGARs pending,  Weight pending, Amniotic fluid clear,  Intact placenta, three vessel cord.  Grossly normal uterus. Bladder tacked very high.  .   ANESTHESIA:    Epidural ESTIMATED BLOOD LOSS: see anethesia record SPECIMENS: Placenta for routine COMPLICATIONS: None immediate   PROCEDURE IN DETAIL:  The patient received intravenous antibiotics (2g Ancef ) and had sequential compression devices applied to her lower extremities while in the preoperative area.  She was then taken to the  operating room where epidural anesthesia was dosed up to surgical level and was found to be adequate. She was then placed in a dorsal supine position with a leftward tilt, and prepped and draped in a sterile manner.  A foley catheter was placed into her bladder and attached to constant gravity.  After an adequate timeout was performed, a Pfannenstiel skin incision was made with scalpel and carried through to the underlying layer of fascia. The fascia was incised in the midline and this incision was extended bilaterally using the Mayo scissors. Kocher clamps were applied to the superior aspect of the fascial incision and the underlying rectus muscles were dissected off bluntly. A similar process was carried out on the inferior aspect of the facial incision. The rectus muscles were separated in the midline bluntly and the peritoneum was entered bluntly.  A bladder flap was created sharply and developed bluntly. A transverse hysterotomy was made with a scalpel and extended bilaterally bluntly. The bladder blade was then removed. The infant was successfully delivered, and cord was clamped and cut and infant was handed over to awaiting neonatology team. Uterine massage was then administered and the placenta delivered intact with three-vessel cord. Cord gases were taken. The uterus was cleared of clot and debris.  The hysterotomy was closed with 0 vicryl.  A second imbricating suture of 0-vicryl was used to reinforce the incision and aid in hemostasis. Bladder retrofilled and no injury noted. The fascia was closed with 0-Vicryl in a running fashion with good restoration of anatomy.  The subcutaneus tissue was irrigated and was reapproximated using three interrupted plain gut stitches.  The skin was closed with 4-0 Vicryl in a subcuticular fashion.  All surgical site and was hemostatic at end of procedure) without any further bleeding on exam.  It's a girl - Jacquline!!    Pt tolerated the procedure well. All  sponge/lap/needle counts were correct  X 2. Pt taken to recovery room in stable condition.     Kelly Milian MD

## 2023-08-23 NOTE — Anesthesia Procedure Notes (Signed)
 Spinal  Patient location during procedure: OR Start time: 08/23/2023 8:50 AM End time: 08/23/2023 8:53 AM Reason for block: surgical anesthesia Staffing Performed: anesthesiologist  Anesthesiologist: Peggye Delon Brunswick, MD Performed by: Peggye Delon Brunswick, MD Authorized by: Peggye Delon Brunswick, MD   Preanesthetic Checklist Completed: patient identified, IV checked, site marked, risks and benefits discussed, surgical consent, monitors and equipment checked, pre-op evaluation and timeout performed Spinal Block Patient position: sitting Prep: DuraPrep Patient monitoring: blood pressure and continuous pulse ox Approach: midline Location: L3-4 Injection technique: single-shot Needle Needle type: Pencan  Needle gauge: 24 G Needle length: 9 cm Assessment Sensory level: T4 Additional Notes Risks and benefits of neuraxial anesthesia including, but not limited to, infection, bleeding, local anesthetic toxicity, headache, hypotension, back pain, block failure, etc. were discussed with the patient. The patient expressed understanding and consented to the procedure. I confirmed that the patient has no bleeding disorders and is not taking blood thinners. I confirmed the patient's last platelet count with the nurse. Monitors were applied. A time-out was performed immediately prior to the procedure. Sterile technique was used throughout the whole procedure.   1 attempt(s)

## 2023-08-23 NOTE — MAU Provider Note (Signed)
 History     CSN: [id number]  Arrival date and time: 08/23/23 1753   Event Date/Time   First Provider Initiated Contact with Patient 08/23/23 1929      Chief Complaint  Patient presents with   BP Evaluation   Headache   HPI [name] is a 43 y.o. year old G67P1011 female at [redacted]w[redacted]d weeks gestation who presents to MAU reporting elevated BP and H/A since last night. She reports taking Tylenol  3 times today without any relief; last does of Tylenol  was at 1400. H/A rated 8/10. She denies any epigastric pain, visual disturbances. She reports some nausea, dizziness and light-headedness. She denies VB or LOF. She reports some (+) FM. She receives [facility] with [facility]. Her mother is present and contributing to the history taking.    OB History     Gravida  3   Para  1   Term  1   Preterm  0   AB  1   Living  1      SAB  0   IAB  0   Ectopic  1   Multiple      Live Births  1           Past Medical History:  Diagnosis Date   Anxiety    Chiari malformation type I (HCC) followed at [facility] (neurologist/ oncologist)   06-07-2012  at [facility]  s/p  supocciptal crainectomy chiari decompression   Chiari malformation type I (HCC)    Chronic headaches    intermitant occiptal headaches , hx chiari malformation   Depression    Endometriosis    History of ectopic pregnancy    08/ 2017   treated w/ methotexrate   History of endometriosis    History of kidney stones    Hypertension    06-06-2016 per pt has taken not medication, diamox , for past several months due to finances no insurance in between jobs/  03-31-2017 per pt has appt w/ pcp to take something else that can be taken while preg.   Hypertension    IIH (idiopathic intracranial hypertension)    per pt dx 2014 --  approx. every 6 months gets spinal tap at [facility]ology  , last one 08-19-2015   Infertility, female    Nicotine  dependence    Pelvic adhesions    Phimosis    left fallopian tube   Polyp of  fallopian tube    ostia tubal polyp    Past Surgical History:  Procedure Laterality Date   BLADDER REPAIR  07/30/2018   Procedure: OPEN CYSTOTOMY REPAIR;  Surgeon: Alvaro Hummer, MD;  Location: [facility] OR;  Service: Urology;;   CHOLECYSTECTOMY     CHROMOPERTUBATION Bilateral 06/06/2016   Procedure: CHROMOPERTUBATION;  Surgeon: Duwaine Blumenthal, DO;  Location: [facility];  Service: Gynecology;  Laterality: Bilateral;   CRANIECTOMY SUBOCCIPITAL W/ CERVICAL LAMINECTOMY / CHIARI  06/07/2012   Decompression w/ resection posterior arch of C1 (chiari malformation Type 1)    CRANIECTOMY SUBOCCIPITAL W/ CERVICAL LAMINECTOMY / CHIARI  2013   DX LAPAROSCOPY W/  ABLATION ENDOMETRIOSIS  2001 and 2006   EXTERNAL FIXATION REMOVAL Bilateral 08/02/2018   Procedure: REMOVAL EXTERNAL FIXATION PELVIS;  Surgeon: Kendal Franky SQUIBB, MD;  Location: [facility] OR;  Service: Orthopedics;  Laterality: Bilateral;   HARDWARE REMOVAL Bilateral 04/12/2019   Procedure: HARDWARE REMOVAL PELVIS;  Surgeon: Kendal Franky SQUIBB, MD;  Location: [facility] OR;  Service: Orthopedics;  Laterality: Bilateral;   HYSTEROSCOPY  LAPAROSCOPY N/A 06/06/2016   Procedure: LAPAROSCOPY DIAGNOSTIC with possible removal of endometriosis;  Surgeon: Duwaine Blumenthal, DO;  Location: [facility];  Service: Gynecology;  Laterality: N/A;   LAPAROSCOPY Bilateral 04/11/2017   Procedure: hysteroscopy lysis of adhesions, incision of uterine septum, suction D and C, and bilateral proximal tubal recannulization LAPAROSCOPy, excision of endometriosis and chromotubation;  Surgeon: Yalcinkaya, Tamer, MD;  Location: [facility];  Service: Gynecology;  Laterality: Bilateral;   LAPAROSCOPY     for endometriosis    ORIF PELVIC FRACTURE N/A 08/02/2018   Procedure: OPEN REDUCTION INTERNAL FIXATION (ORIF) PELVIC FRACTURE;  Surgeon: Kendal Franky SQUIBB, MD;  Location: [facility] OR;  Service: Orthopedics;  Laterality: N/A;   ORIF PELVIC FRACTURE WITH  PERCUTANEOUS SCREWS N/A 07/30/2018   Procedure: I&D and EXTERNAL FIXATION OF PELVIS;  Surgeon: Kendal Franky SQUIBB, MD;  Location: [facility] OR;  Service: Orthopedics;  Laterality: N/A;   SACRO-ILIAC PINNING Bilateral 08/02/2018   Procedure: SACRO-ILIAC PINNING;  Surgeon: Kendal Franky SQUIBB, MD;  Location: [facility] OR;  Service: Orthopedics;  Laterality: Bilateral;   SPINAL PUNCTURE LUMBAR DIAG ([facility] HX)  last one at [facility] 08-19-2015   for chiari occiptal headaches   TONSILLECTOMY  child   WISDOM TOOTH EXTRACTION  1994    Family History  Problem Relation Age of Onset   Multiple sclerosis Mother    Diabetes Mother    Heart attack Mother    High blood pressure Father     Social History   Tobacco Use   Smoking status: Every Day    Current packs/day: 1.00    Average packs/day: 1 pack/day for 17.0 years (17.0 ttl pk-yrs)    Types: Cigarettes   Smokeless tobacco: Never  Vaping Use   Vaping status: Never Used  Substance Use Topics   Alcohol use: Yes    Comment: 3-4 drinks a week   Drug use: Never    Allergies: No Known Allergies  Medications Prior to Admission  Medication Sig Dispense Refill Last Dose/Taking   docusate sodium  (COLACE) 100 MG capsule Take 1 tablet once or twice daily as needed for constipation while taking narcotic pain medicine 30 capsule 0 08/22/2023   escitalopram  (LEXAPRO ) 10 MG tablet Take 2 tablets (20 mg total) by mouth daily. 60 tablet 0 08/22/2023   labetalol  (NORMODYNE ) 200 MG tablet Take 200 mg by mouth 3 (three) times daily.   Taking   Prenatal Vit-Fe Fumarate-FA (MULTIVITAMIN-PRENATAL) 27-0.8 MG TABS tablet Take 1 tablet by mouth daily at 12 noon.   08/22/2023   metroNIDAZOLE  (FLAGYL ) 500 MG tablet Take 1 tablet (500 mg total) by mouth 2 (two) times daily. 14 tablet 0 Unknown   ondansetron  (ZOFRAN -ODT) 4 MG disintegrating tablet Allow 1-2 tablets to dissolve in your mouth every 8 hours as needed for nausea/vomiting 30 tablet 0 Unknown    Review of Systems  Constitutional:   Positive for fatigue.  HENT: Negative.    Eyes: Negative.   Respiratory:  Positive for cough (since last night).   Cardiovascular: Negative.   Gastrointestinal:  Positive for nausea.  Endocrine: Negative.   Genitourinary:  Positive for pelvic pain (contractions).  Musculoskeletal: Negative.   Skin: Negative.   Allergic/Immunologic: Negative.   Neurological:  Positive for headaches (rated 8/10).  Hematological: Negative.   Psychiatric/Behavioral: Negative.     Physical Exam   Patient Vitals for the past 24 hrs:  BP Temp Temp src Pulse SpO2 Height Weight  08/23/23 2027 (!) 150/69 -- -- 92 98 % -- --  08/23/23 2009 (!) 153/75 -- -- (!) 101 -- -- --  08/23/23 1950 -- -- -- -- 95 % -- --  08/23/23 1945 -- -- -- -- 94 % -- --  08/23/23 1940 (!) 166/79 -- -- 94 94 % -- --  08/23/23 1935 -- -- -- -- 95 % -- --  08/23/23 1930 -- -- -- -- 94 % -- --  08/23/23 1915 -- -- -- -- 96 % -- --  08/23/23 1910 -- -- -- -- 97 % -- --  08/23/23 1905 (!) 186/85 -- -- 99 96 % -- --  08/23/23 1900 -- -- -- -- 97 % -- --  08/23/23 1842 (!) 170/92 99 F (37.2 C) Oral 95 98 % -- --  08/23/23 1837 -- -- -- -- -- 5' 3 (1.6 m) 89.2 kg    Physical Exam Vitals and nursing note reviewed. Exam conducted with a chaperone present.  Constitutional:      Appearance: Normal appearance. She is obese.  HENT:     Head: Normocephalic and atraumatic.  Cardiovascular:     Rate and Rhythm: Normal rate.  Pulmonary:     Effort: Pulmonary effort is normal.  Genitourinary:    Comments: deferred Musculoskeletal:        General: Normal range of motion.  Skin:    General: Skin is warm and dry.  Neurological:     Mental Status: She is alert and oriented to person, place, and time.  Psychiatric:        Mood and Affect: Mood normal.        Behavior: Behavior normal.        Thought Content: Thought content normal.        Judgment: Judgment normal.    REACTIVE NST - FHR: 150 bpm / moderate variability / accels  present / decels absent / TOCO: regular every 2-3 mins  MAU Course  Procedures  MDM CCUA Start IV LR 1 liter bolus @ 999 ml/hr CBC CMP P/C Ratio Serial BP's  Labetalol  Protocol Excedrin  Tension H/A 2 caplets po Benadryl  25 mg IVP Compazine  10 mg IVP COVID/RSV/Flu Swab  Results for orders placed or performed during the hospital encounter of 08/23/23 (from the past 24 hours)  Urinalysis, Routine w reflex microscopic -Urine, Clean Catch     Status: Abnormal   Collection Time: 08/23/23  7:03 PM  Result Value Ref Range   Color, Urine YELLOW YELLOW   APPearance CLOUDY (A) CLEAR   Specific Gravity, Urine 1.020 1.005 - 1.030   pH 6.5 5.0 - 8.0   Glucose, UA NEGATIVE NEGATIVE mg/dL   Hgb urine dipstick NEGATIVE NEGATIVE   Bilirubin Urine NEGATIVE NEGATIVE   Ketones, ur >80 (A) NEGATIVE mg/dL   Protein, ur 30 (A) NEGATIVE mg/dL   Nitrite NEGATIVE NEGATIVE   Leukocytes,Ua NEGATIVE NEGATIVE  Urinalysis, Microscopic (reflex)     Status: Abnormal   Collection Time: 08/23/23  7:03 PM  Result Value Ref Range   RBC / HPF 0-5 0 - 5 RBC/hpf   WBC, UA 0-5 0 - 5 WBC/hpf   Bacteria, UA RARE (A) NONE SEEN   Squamous Epithelial / HPF 6-10 0 - 5 /HPF  CBC     Status: Abnormal   Collection Time: 08/23/23  7:19 PM  Result Value Ref Range   WBC 8.2 4.0 - 10.5 K/uL   RBC 3.69 (L) 3.87 - 5.11 MIL/uL   Hemoglobin 11.3 (L) 12.0 - 15.0 g/dL   HCT 66.4 (L) 63.9 - 53.9 %  MCV 90.8 80.0 - 100.0 fL   MCH 30.6 26.0 - 34.0 pg   MCHC 33.7 30.0 - 36.0 g/dL   RDW 86.6 88.4 - 84.4 %   Platelets 173 150 - 400 K/uL   nRBC 0.0 0.0 - 0.2 %  Type and screen [facility]     Status: None (Preliminary result)   Collection Time: 08/23/23  7:19 PM  Result Value Ref Range   ABO/RH(D) PENDING    Antibody Screen PENDING    Sample Expiration      08/26/2023,2359 Performed at [facility], [address] [place], [place] [address]     Assessment and Plan  1. Pre-eclampsia in third  trimester (Primary) 2. Headache in pregnancy, antepartum, third trimester 3. [redacted] weeks gestation of pregnancy - Dr. Marne on unit seeing patient and made decision to take the patient for a cesarean delivery - Dr. Marne assumes care of patient at [facility], CNM 08/23/2023, 7:29 PM

## 2023-08-24 ENCOUNTER — Encounter (HOSPITAL_COMMUNITY): Payer: Self-pay | Admitting: Obstetrics and Gynecology

## 2023-08-24 LAB — CBC
HCT: 27.9 % — ABNORMAL LOW (ref 36.0–46.0)
Hemoglobin: 9.6 g/dL — ABNORMAL LOW (ref 12.0–15.0)
MCH: 31 pg (ref 26.0–34.0)
MCHC: 34.4 g/dL (ref 30.0–36.0)
MCV: 90 fL (ref 80.0–100.0)
Platelets: 176 10*3/uL (ref 150–400)
RBC: 3.1 MIL/uL — ABNORMAL LOW (ref 3.87–5.11)
RDW: 13.2 % (ref 11.5–15.5)
WBC: 9.4 10*3/uL (ref 4.0–10.5)
nRBC: 0 % (ref 0.0–0.2)

## 2023-08-24 MED ORDER — ESCITALOPRAM OXALATE 10 MG PO TABS
20.0000 mg | ORAL_TABLET | Freq: Every day | ORAL | Status: DC
Start: 2023-08-24 — End: 2023-08-27
  Administered 2023-08-24 – 2023-08-27 (×4): 20 mg via ORAL
  Filled 2023-08-24 (×4): qty 2

## 2023-08-24 MED ORDER — LABETALOL HCL 200 MG PO TABS
200.0000 mg | ORAL_TABLET | Freq: Three times a day (TID) | ORAL | Status: DC
  Administered 2023-08-24 – 2023-08-26 (×8): 200 mg via ORAL
  Filled 2023-08-24 (×8): qty 1

## 2023-08-24 MED ORDER — DIBUCAINE (PERIANAL) 1 % EX OINT: 1.0000 | TOPICAL_OINTMENT | CUTANEOUS | Status: DC | PRN

## 2023-08-24 MED ORDER — LACTATED RINGERS IV SOLN: INTRAVENOUS | Status: AC

## 2023-08-24 MED ORDER — ZOLPIDEM TARTRATE 5 MG PO TABS: 5.0000 mg | ORAL_TABLET | Freq: Every evening | ORAL | Status: DC | PRN

## 2023-08-24 MED ORDER — WITCH HAZEL-GLYCERIN EX PADS: 1.0000 | MEDICATED_PAD | CUTANEOUS | Status: DC | PRN

## 2023-08-24 MED ORDER — KETOROLAC TROMETHAMINE 30 MG/ML IJ SOLN
30.0000 mg | Freq: Four times a day (QID) | INTRAMUSCULAR | Status: AC
  Administered 2023-08-24 – 2023-08-25 (×4): 30 mg via INTRAVENOUS
  Filled 2023-08-24 (×4): qty 1

## 2023-08-24 MED ORDER — OXYTOCIN-SODIUM CHLORIDE 30-0.9 UT/500ML-% IV SOLN
2.5000 [IU]/h | INTRAVENOUS | Status: AC
  Administered 2023-08-23: 2.5 [IU]/h via INTRAVENOUS

## 2023-08-24 MED ORDER — MENTHOL 3 MG MT LOZG: 1.0000 | LOZENGE | OROMUCOSAL | Status: DC | PRN

## 2023-08-24 MED ORDER — PRENATAL MULTIVITAMIN CH
1.0000 | ORAL_TABLET | Freq: Every day | ORAL | Status: DC
  Administered 2023-08-24 – 2023-08-26 (×3): 1 via ORAL
  Filled 2023-08-24 (×3): qty 1

## 2023-08-24 MED ORDER — SIMETHICONE 80 MG PO CHEW
80.0000 mg | CHEWABLE_TABLET | Freq: Three times a day (TID) | ORAL | Status: DC
  Administered 2023-08-24 – 2023-08-27 (×7): 80 mg via ORAL
  Filled 2023-08-24 (×7): qty 1

## 2023-08-24 MED ORDER — DIPHENHYDRAMINE HCL 25 MG PO CAPS: 25.0000 mg | ORAL_CAPSULE | Freq: Four times a day (QID) | ORAL | Status: DC | PRN

## 2023-08-24 MED ORDER — ACETAMINOPHEN 500 MG PO TABS
1000.0000 mg | ORAL_TABLET | Freq: Four times a day (QID) | ORAL | Status: DC
  Administered 2023-08-24 – 2023-08-27 (×12): 1000 mg via ORAL
  Filled 2023-08-24 (×16): qty 2

## 2023-08-24 MED ORDER — COCONUT OIL OIL
1.0000 | TOPICAL_OIL | Status: DC | PRN
  Administered 2023-08-24: 1 via TOPICAL

## 2023-08-24 MED ORDER — HYDROMORPHONE HCL 1 MG/ML IJ SOLN: 0.2000 mg | INTRAMUSCULAR | Status: DC | PRN

## 2023-08-24 MED ORDER — OXYCODONE HCL 5 MG PO TABS
5.0000 mg | ORAL_TABLET | ORAL | Status: DC | PRN
  Administered 2023-08-24: 5 mg via ORAL
  Administered 2023-08-25: 10 mg via ORAL
  Administered 2023-08-25: 5 mg via ORAL
  Administered 2023-08-26 – 2023-08-27 (×3): 10 mg via ORAL
  Filled 2023-08-24 (×2): qty 2
  Filled 2023-08-24: qty 1
  Filled 2023-08-24 (×2): qty 2

## 2023-08-24 MED ORDER — SIMETHICONE 80 MG PO CHEW: 80.0000 mg | CHEWABLE_TABLET | ORAL | Status: DC | PRN

## 2023-08-24 MED ORDER — SENNOSIDES-DOCUSATE SODIUM 8.6-50 MG PO TABS
2.0000 | ORAL_TABLET | Freq: Every day | ORAL | Status: DC
  Administered 2023-08-24 – 2023-08-27 (×3): 2 via ORAL
  Filled 2023-08-24 (×4): qty 2

## 2023-08-24 MED ORDER — IBUPROFEN 600 MG PO TABS
600.0000 mg | ORAL_TABLET | Freq: Four times a day (QID) | ORAL | Status: DC
  Administered 2023-08-25 – 2023-08-27 (×9): 600 mg via ORAL
  Filled 2023-08-24 (×10): qty 1

## 2023-08-24 NOTE — Transfer of Care (Signed)
 Immediate Anesthesia Transfer of Care Note  Patient: Olivia Melendez  Procedure(s) Performed: PRIMARY CESAREAN SECTION WITH BILATERAL TUBAL LIGATION EDC: 09/17/23  ALLERG: NKDA  Patient Location: PACU  Anesthesia Type:Spinal  Level of Consciousness: awake, alert , and oriented  Airway & Oxygen Therapy: Patient Spontanous Breathing  Post-op Assessment: Report given to RN and Post -op Vital signs reviewed and stable  Post vital signs: Reviewed and stable  Last Vitals:  Vitals Value Taken Time  BP 129/66 08/24/23 0030  Temp 36.4 C 08/24/23 0004  Pulse 68 08/24/23 0030  Resp 16 08/24/23 0029  SpO2 94 % 08/24/23 0004  Vitals shown include unfiled device data.  Last Pain:  Vitals:   08/24/23 0004  TempSrc: Oral  PainSc:       Patients Stated Pain Goal: 0 (08/23/23 2319)  Complications: No notable events documented.

## 2023-08-24 NOTE — Lactation Note (Signed)
 This note was copied from a baby's chart.  NICU Lactation Consultation Note  Patient Name: Olivia Melendez Unijb'd Date: 08/24/2023 Age:41 hours  Reason for consult: Initial assessment; Late-preterm 34-36.6wks; Other (Comment); Maternal endocrine disorder; NICU baby; 1st time breastfeeding (AMA, smoker, cHTN, severe Pre-E) Type of Endocrine Disorder?: Diabetes (GDMA1)  SUBJECTIVE Visited with family of 37 34/27 weeks old AGA NICU female; baby Olivia Melendez got admitted to the NICU due to respiratory distress. Olivia Melendez is a P2 but this is her first time breastfeeding, her first child is now 49 y.o. Her plan is to do primarily breastfeeding if possible, baby currently on Similac 24 calorie formula. Assisted with breast massage, hand expression and fit her with a size L pumping band for hands on pumping; she did her second pumping session during [facility] consult, praised her for her efforts. Reviewed pumping schedule, pump settings, pumping log, lactogenesis II and anticipatory guidelines.   OBJECTIVE Infant data: Mother's Current Feeding Choice: Breast Milk and Formula  O2 Device: HHFNC O2 Flow Rate (L/min): 2 L/min FiO2 (%): 21 %  Infant feeding assessment IDFTS - Readiness: 5 (tachypnea)   Maternal data: [id number] C-Section, Low Transverse Has patient been taught Hand Expression?: Yes Hand Expression Comments: small droplet of colostrum noted Significant Breast History:: moderate breast changes during the pregnancy Current breast feeding challenges:: NICU admission Does the patient have breastfeeding experience prior to this delivery?: No Pumping frequency: Initiated pumping at 11 hours post-partum Pumped volume: 0 mL Flange Size: 21 Hands-free pumping top sizes: Large Olivia Melendez) Risk factor for low/delayed milk supply:: C/C, prematurity, GDMA1, AMA, infant separation  Pump: Hands Free  ASSESSMENT Infant: Feeding Status: Scheduled 8-11-2-5 Feeding method: Tube/Gavage  (Bolus)  Maternal: Milk volume: Normal  INTERVENTIONS/PLAN Interventions: Interventions: Breast feeding basics reviewed; Breast massage; Hand express; Coconut oil; DEBP; Education; [employer] Services brochure; CDC Guidelines for Breast Pump Cleaning; NICU Pumping Log Tools: Pump; Flanges; Coconut oil; Hands-free pumping top Pump Education: Setup, frequency, and cleaning; Milk Storage  Plan: Encouraged pumping every 3 hours, ideally 8 pumping sessions/24 hours Breast massage, hand expression and coconut oil were also encouraged prior pumping STS when is possible (MOB currently on droplet precautions)  Olivia Melendez and stepbrother present. All questions and concerns answered, family to contact [facility] services PRN.  Consult Status: NICU follow-up NICU Follow-up type: New admission follow up   Olivia Melendez 08/24/2023, 1:17 PM

## 2023-08-24 NOTE — Progress Notes (Signed)
 Subjective: Postpartum Day 1: Cesarean Delivery Patient reports tolerating PO and no problems voiding.  No HA, vision change, RUQ pain, CP/SOB.  Mild cough and no body aches/chills.  Objective: Vital signs in last 24 hours: Temp:  [97.4 F (36.3 C)-99 F (37.2 C)] 97.6 F (36.4 C) (01/09 0004) Pulse Rate:  [63-101] 68 (01/09 0904) Resp:  [14-20] 18 (01/09 1059) BP: (121-186)/(61-92) 130/73 (01/09 0904) SpO2:  [92 %-98 %] 96 % (01/09 0904) Weight:  [84.9 kg-89.2 kg] 84.9 kg (01/09 0512)  Physical Exam:  General: alert, cooperative, and appears stated age 43: appropriate Uterine Fundus: firm Incision: healing well, no significant drainage, no dehiscence, pressure dressing removed DVT Evaluation: No evidence of DVT seen on physical exam. Negative Homan's sign. No cords or calf tenderness.  Recent Labs    08/23/23 1919 08/24/23 0635  HGB 11.3* 9.6*  HCT 33.5* 27.9*    Assessment/Plan: Status post Cesarean section. Doing well postoperatively.  Continue current care. Superimposed Pre-Eclampsia with severe features-Continue magnesium  recovery x 24 hours posptartum.  Asymptomatic and normal range BPs on labetalol  200 TID.   Flu A positive-patient reports only mild cough.  Continue to monitor.  Duwaine Blumenthal, DO 08/24/2023, 11:11 AM

## 2023-08-24 NOTE — Anesthesia Postprocedure Evaluation (Signed)
 Anesthesia Post Note  Patient: Olivia Melendez  Procedure(s) Performed: PRIMARY CESAREAN SECTION WITH BILATERAL TUBAL LIGATION EDC: 09/17/23  ALLERG: NKDA     Patient location during evaluation: PACU Anesthesia Type: Spinal Level of consciousness: awake Pain management: pain level controlled Vital Signs Assessment: post-procedure vital signs reviewed and stable Respiratory status: spontaneous breathing, respiratory function stable and nonlabored ventilation Cardiovascular status: blood pressure returned to baseline and stable Postop Assessment: no headache, no backache and no apparent nausea or vomiting Anesthetic complications: no   No notable events documented.  Last Vitals:  Vitals:   08/24/23 0023 08/24/23 0029  BP: 132/68 131/68  Pulse: 69 69  Resp: 16 16  Temp:    SpO2:      Last Pain:  Vitals:   08/24/23 0004  TempSrc: Oral  PainSc:    Pain Goal: Patients Stated Pain Goal: 0 (08/23/23 2319)                 Delon Aisha Arch

## 2023-08-24 NOTE — Plan of Care (Signed)
  Problem: Fluid Volume: Goal: Peripheral tissue perfusion will improve Outcome: Progressing   Problem: Clinical Measurements: Goal: Complications related to disease process, condition or treatment will be avoided or minimized Outcome: Progressing   Problem: Health Behavior/Discharge Planning: Goal: Ability to manage health-related needs will improve Outcome: Progressing   Problem: Clinical Measurements: Goal: Will remain free from infection Outcome: Progressing   Problem: Activity: Goal: Risk for activity intolerance will decrease Outcome: Progressing

## 2023-08-25 MED ORDER — DOCUSATE SODIUM 100 MG PO CAPS
100.0000 mg | ORAL_CAPSULE | Freq: Two times a day (BID) | ORAL | Status: DC
Start: 2023-08-25 — End: 2023-08-27
  Administered 2023-08-25 – 2023-08-27 (×5): 100 mg via ORAL
  Filled 2023-08-25 (×5): qty 1

## 2023-08-25 NOTE — Progress Notes (Signed)
 POD # 2  Doing well.  BP (!) 141/74 (BP Location: Left Arm)   Pulse 72   Temp 97.9 F (36.6 C) (Oral)   Resp 18   Ht 5' 3 (1.6 m)   Wt 81 kg   SpO2 96%   Breastfeeding Unknown   BMI 31.64 kg/m  No results found for this or any previous visit (from the past 24 hours).  Abdomen is soft and non tender  Bandage is clean and dry   POD # 2  Feeling sore Doing well Routine care  Baby in NICU

## 2023-08-25 NOTE — Progress Notes (Signed)
Pt off of unit to NICU

## 2023-08-25 NOTE — Clinical Social Work Maternal (Addendum)
 CLINICAL SOCIAL WORK MATERNAL/CHILD NOTE  Patient Details  Name: Olivia Melendez MRN: [id number] Date of Birth: 11/07/1980  Date:  Dec 28, 2023  Clinical Social Worker Initiating Note:  Nat Quiet, [place] Date/Time: Initiated:  03/20/24/1430     Child's Name:  Olivia Melendez   Biological Parents:  Mother, Father (MOB: Olivia Reed[birth year]/17/1984 FOB: Olivia Melendez 12/08/1977)   Need for Interpreter:  None   Reason for Referral:  Other (Comment) (NICU admission)   Address:  [address] [place] [address]   MOB will discharge to maternal grandmother address: [address], [place] [phone]   Phone number:  [phone] (home)     Additional phone number:   Household Members/Support Persons (HM/SP):   Household Member/Support Person 1, Household Member/Support Person 2   HM/SP Name Relationship DOB or Age  HM/SP -1 Olivia Melendez Mother 06/12/1954  HM/SP -2 Olivia Melendez Father unknown  HM/SP -3        HM/SP -4        HM/SP -5        HM/SP -6        HM/SP -7        HM/SP -8          Natural Supports (not living in the home):      Professional Supports: None   Employment: Full-time   Type of Work: Chief Strategy Officer   Education:  Some Materials Engineer arranged:    Surveyor, Quantity Resources:  Media Planner    Other Resources:      Cultural/Religious Considerations Which May Impact Care:    Strengths:  Ability to meet basic needs  , Home prepared for child  , Psychotropic Medications, Pediatrician chosen   Psychotropic Medications:  Lexapro      Pediatrician:    [employer] List:   [employer] [place]    [facility]  [facility]      Pediatrician Fax Number:    Risk Factors/Current Problems:  Mental Health Concerns     Cognitive State:  Able to Concentrate  , Goal Oriented  , Alert  , Insightful     Mood/Affect:  Comfortable  ,  Calm     CSW Assessment: CSW received consult for NICU admission. CSW met with MOB to offer support and complete assessment.    CSW met with MOB at bedside and introduced CSW role. MOB presented calm and welcomed CSW visit. CSW asked if MOB demographic information on hospital file was correct. MOB reported that the address on file is FOB however she and the infant will discharge to her mother's home at [address], [place] [address]. CSW inquired if MOB received WIC/SNAP benefits. MOB reported that she was told to apply for [facility] since the infant may discharge on a special formula. CSW provided MOB with WIC information to follow up. CSW inquired if MOB had all items to care for the infant. MOB reported that she has all items to care for infant including a bassinet and carseat. CSW inquired about MOB support. MOB acknowledged her MOB as a great support.   CSW asked MOB how she has been feeling since giving birth. MOB reported that she has been feeling good. CSW inquired MOB mental health history. MOB acknowledged that she has a history of anxiety and depression. MOB reported that she currently takes Lexapro daily to treat her symptoms and  feels the medication is very helpful. MOB reported she will continue to take the medication during the postpartum period. MOB reported that she also has access to a counselor through her employer's employee assistance program. CSW provided education regarding the baby blues period vs. perinatal mood disorders and discussed treatment. CSW recommended MOB complete a self-evaluation during the postpartum time period using the New Mom Checklist from Postpartum Progress and encouraged MOB to contact a medical professional if symptoms are noted at any time. MOB reported that she feels comfortable reaching out to provider if concerns arise. CSW assessed MOB for safety. MOB denied SI/HI and domestic violence.   CSW inquired if MOB had chosen a pediatrician. MOB reported that  she has chosen a optometrist at [facility] in [place]. CSW assessed MOB for additional needs MOB reported none.   CSW Plan/Description:  Perinatal Mood and Anxiety Disorder (PMADs) Education, No Further Intervention Required/No Barriers to Discharge, Sudden Infant Death Syndrome (SIDS) Education    Nat DELENA Quiet, LCSW 12-25-2023, 3:31 PM

## 2023-08-25 NOTE — Lactation Note (Signed)
 This note was copied from a baby's chart.  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  This is Mom's first time breastfeeding, and would like some assistance.  Mom exhausted currently and plans to pump and then take a nap.  LC offered to meet her for the 5pm feeding in baby's room.  Encouraged continued consistent pumping. LC provided a drying bin with cloth and paper towels as Mom was drying the pump flanges in a plastic bin without paper towel or cloth.  Mom to ask baby's RN to call [facility] for latch assistance at 5pm or prn.   Encouraged STS with baby as much as possible.  OBJECTIVE Infant data: Mother's Current Feeding Choice: Breast Milk and Formula  O2 Device: Non-rebreather Mask O2 Flow Rate (L/min): 2 L/min FiO2 (%): 21 %  Infant feeding assessment IDFTS - Readiness: 3   Maternal data: [id number] C-Section, Low Transverse Has patient been taught Hand Expression?: Yes Hand Expression Comments: small droplet of colostrum noted Significant Breast History:: moderate breast changes during the pregnancy Current breast feeding challenges:: NICU admission Does the patient have breastfeeding experience prior to this delivery?: No Pumping frequency: 8 times per 24 hrs Pumped volume: 0 mL Flange Size: 21 Hands-free pumping top sizes: Large Alejos) Risk factor for low/delayed milk supply:: C/C, prematurity, GDMA1, AMA, infant separation  Pump: Hands Free (Lansinoh through insurance)  ASSESSMENT Infant:  Feeding Status: Scheduled 8-11-2-5 Feeding method: Tube/Gavage (Bolus); Breast (breast attempt) Nipple Type: Dr. Jonna Fling Preemie  Maternal: Milk volume:  Normal  INTERVENTIONS/PLAN Interventions: Interventions: Breast feeding basics reviewed; Skin to skin; Breast massage; Hand express; DEBP; Education Tools: Pump; Flanges Pump Education: Setup, frequency, and cleaning; Milk Storage  Plan: Consult Status: NICU follow-up NICU Follow-up type: Verify absence of engorgement; Verify onset of copious milk; Maternal D/C visit   Claudene Aleck BRAVO 08/25/2023, 1:47 PM

## 2023-08-25 NOTE — Progress Notes (Signed)
Pt remains off of unit.

## 2023-08-26 MED ORDER — CALCIUM CARBONATE ANTACID 500 MG PO CHEW
1.0000 | CHEWABLE_TABLET | Freq: Three times a day (TID) | ORAL | Status: DC
  Administered 2023-08-27 (×2): 200 mg via ORAL
  Filled 2023-08-26 (×2): qty 1

## 2023-08-26 MED ORDER — LABETALOL HCL 200 MG PO TABS
300.0000 mg | ORAL_TABLET | Freq: Three times a day (TID) | ORAL | Status: DC
  Administered 2023-08-26 – 2023-08-27 (×2): 300 mg via ORAL
  Filled 2023-08-26 (×2): qty 1

## 2023-08-26 NOTE — Lactation Note (Signed)
 This note was copied from a baby's chart. Lactation Consultation Note  Patient Name: Olivia Melendez Date: 08/26/2023 Age:41 hours Reason for consult: Follow-up assessment;NICU baby;Primapara;1st time breastfeeding;Late-preterm 34-36.6wks;Infant < 6lbs;Engorgement;Nipple pain/trauma;Breastfeeding assistance;Maternal endocrine disorder  Attempted to assist at 5 pm feeding. LC spoke with baby's RN and found out the Mom was too fatigued to attempt to feed.  Will F/U at a later time.  Claudene Fitch E RN IBCLC 08/26/2023, 3:31 PM

## 2023-08-26 NOTE — Lactation Note (Signed)
 This note was copied from a baby's chart.  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  Mom with baby Olivia Melendez in her lap while she was trying to lean into baby.  Baby actively rooting.  Gavage feeding running.  LC added a pillow under baby.  LC noted that due to severe engorgement, areola was non-compressible and nipple flattened.  Mom states she iced for 20 mins prior to coming up but didn't pump.    LC set up the DEBP and assisted Mom to pump while baby was STS on her chest.  Mom reclined slightly in the chair.    Mom expressed drops of colostrum, breasts remain engorged.  LC provided Mom with breast shells to swelling of areola.    Mom to place ice packs on her breasts, laying flat in bed, asking for another ice pack or two.  Mom should pump after icing until breasts are softened.    LC asked RN about an ibuprofen  Rx for swelling.  OBJECTIVE Infant data: No data recorded O2 Device: Room Air O2 Flow Rate (L/min): 2 L/min FiO2 (%): 21 %  Infant feeding assessment IDFTS - Readiness: 3 IDFTS - Quality: 3   Maternal data: [id number] C-Section, Low Transverse Pumping frequency: 7-8 times per 24 hrs Pumped volume: 0 mL (drops) Flange Size: 21 Hands-free pumping top sizes: Large Alejos)  Pump: Personal, DEBP (lansinoh)  ASSESSMENT Infant: Latch: Repeated attempts needed to sustain latch, nipple held in mouth throughout feeding, stimulation needed to elicit sucking reflex. Audible Swallowing: None Type of Nipple: Flat Comfort (Breast/Nipple): Engorged, cracked, bleeding, large blisters, severe discomfort  (engorged) Hold (Positioning): Assistance needed to correctly position infant at breast and maintain latch. LATCH Score: 3  Feeding Status: Scheduled 8-11-2-5 Feeding method: Breast Nipple Type: Nfant Extra Slow Flow (gold)  Maternal: Milk volume: Normal  INTERVENTIONS/PLAN Interventions: Interventions: Breast feeding basics reviewed; Assisted with latch; Skin to skin; Breast massage; Hand express; Pre-pump if needed; Ice; DEBP; Hand pump; Shells; Coconut oil; Education Discharge Education: Engorgement and breast care Tools: Shells; Pump; Flanges; Coconut oil; Hands-free pumping top Pump Education: Setup, frequency, and cleaning; Milk Storage  Plan: Consult Status: NICU follow-up NICU Follow-up type: Verify onset of copious milk; Verify absence of engorgement; Maternal D/C visit   Claudene Aleck BRAVO 08/26/2023, 3:35 PM

## 2023-08-26 NOTE — Progress Notes (Signed)
 POD # 3  Doing well. No concerns. Baby off oxygen in NICU.  BP (!) 151/80 (BP Location: Right Arm)   Pulse 68   Temp 97.7 F (36.5 C) (Oral)   Resp 18   Ht 5' 3 (1.6 m)   Wt 84.9 kg   SpO2 100%   Breastfeeding Unknown   BMI 33.15 kg/m  No results found for this or any previous visit (from the past 24 hours). Scheduled Meds:  acetaminophen   1,000 mg Oral Q6H   docusate sodium   100 mg Oral BID   escitalopram   20 mg Oral Daily   ibuprofen   600 mg Oral Q6H   labetalol   200 mg Oral TID   prenatal multivitamin  1 tablet Oral Q1200   senna-docusate  2 tablet Oral Daily   simethicone   80 mg Oral TID PC   Continuous Infusions: PRN Meds:.coconut oil, witch hazel-glycerin  **AND** dibucaine, diphenhydrAMINE , labetalol  **AND** labetalol  **AND** labetalol  **AND** hydrALAZINE  **AND** Measure blood pressure, HYDROmorphone  (DILAUDID ) injection, menthol -cetylpyridinium, oxyCODONE , simethicone , zolpidem  Abdomen soft and non tender uterus  Bandage is clean and dry and intact   POD # 3 c section Flu A positive Superimposed Preeclampsia with severe features  Doing very well No concerns Continue labetalol  Consider discharge home tomorrow

## 2023-08-26 NOTE — Lactation Note (Addendum)
 This note was copied from a baby's chart.  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  Baby is off oxygen support and PIV.  Baby is being bottle and gavage fed.  Mom states she hasn't done much STS.   Breasts are engorged.  Last pumping, Mom expressed 1-2 ml.  LC placed this colostrum in the refrigerator explaining that EBM was fine at room temp for 4 hrs.  Mom educated on how every drop counts to take to baby.  Mom to ask for breast milk labels.  Mom woke this morning to her engorgement and was instructed to place heat on breasts.  LC revised treatment to icing 20 mins prior to pumping.  LC placed bed flat and assisted Mom to ice her breasts for 20 mins and then pump, continuing to ice and pump until milk starts to flow and breasts soften.  Offered assistance with STS and baby learning to breastfeed.  Mom to have RN call LC.  OBJECTIVE Infant data: No data recorded O2 Device: Room Air O2 Flow Rate (L/min): 2 L/min FiO2 (%): 21 %  Infant feeding assessment IDFTS - Readiness: 1 IDFTS - Quality: 3   Maternal data: [id number] C-Section, Low Transverse Pumping frequency: 8 times per 24 hrs Pumped volume: 1 mL Flange Size: 21 Hands-free pumping top sizes: Large Alejos)  Pump: Personal, DEBP (lansinoh)  ASSESSMENT Infant:   Feeding Status: Scheduled 8-11-2-5 Feeding method: Bottle; Tube/Gavage (Bolus) Nipple Type: Nfant Extra Slow Flow (gold)  Maternal: Milk volume: Normal  INTERVENTIONS/PLAN Interventions: Interventions: Breast feeding basics reviewed; Skin to skin; Breast massage; Hand express; Expressed milk; DEBP; Ice;  Education Discharge Education: Engorgement and breast care; Outpatient recommendation Tools: Pump; Flanges; Hands-free pumping top Pump Education: Setup, frequency, and cleaning; Milk Storage  Plan: Consult Status: NICU follow-up NICU Follow-up type: Maternal D/C visit; Verify onset of copious milk; Verify absence of engorgement   Claudene Aleck BRAVO 08/26/2023, 9:55 AM

## 2023-08-27 LAB — BPAM RBC
Blood Product Expiration Date: 202502042359
Blood Product Expiration Date: 202502042359
ISSUE DATE / TIME: 202501082120
ISSUE DATE / TIME: 202501082120
Unit Type and Rh: 6200
Unit Type and Rh: 6200

## 2023-08-27 LAB — TYPE AND SCREEN
ABO/RH(D): AB POS
Antibody Screen: NEGATIVE
Unit division: 0
Unit division: 0

## 2023-08-27 MED ORDER — NIFEDIPINE ER 30 MG PO TB24: 30.0000 mg | ORAL_TABLET | Freq: Every day | ORAL | 1 refills | Status: AC

## 2023-08-27 MED ORDER — LABETALOL HCL 300 MG PO TABS: 300.0000 mg | ORAL_TABLET | Freq: Three times a day (TID) | ORAL | 1 refills | Status: AC

## 2023-08-27 MED ORDER — NIFEDIPINE ER OSMOTIC RELEASE 30 MG PO TB24
30.0000 mg | ORAL_TABLET | Freq: Every day | ORAL | Status: DC
  Administered 2023-08-27: 30 mg via ORAL
  Filled 2023-08-27: qty 1

## 2023-08-27 MED ORDER — OXYCODONE HCL 5 MG PO TABS: 5.0000 mg | ORAL_TABLET | ORAL | 0 refills | Status: AC | PRN

## 2023-08-27 NOTE — Progress Notes (Addendum)
 CSW acknowledges social work consult placed due to Edinburgh score of 13. CSW completed psychosocial assessment with patient 29-Mar-2024 and addressed mental health care. CSW is screening consult out. Please contact CSW if additional needs arise.   Signed,  Sharyne LOIS Roulette, MSW, LCSWA, LCASA 2024/04/29 9:53 AM

## 2023-08-27 NOTE — Discharge Summary (Signed)
 Postpartum Discharge Summary  Date of Service August 27, 2023     Patient Name: Olivia Melendez DOB: [birth year] MRN: [id number]  Date of admission: 08/23/2023 Delivery date:08/23/2023 Delivering provider: MARNE KELLY NEST Date of discharge: 08/27/2023  Admitting diagnosis: History of pelvic fracture [Z87.81] Intrauterine pregnancy: [redacted]w[redacted]d     Secondary diagnosis:  Principal Problem:   History of pelvic fracture  Additional problems: none    Discharge diagnosis: Preterm Pregnancy Delivered and Preeclampsia (severe)                                              Post partum procedures: none Augmentation: N/A Complications: None  Hospital course: Sceduled C/S   43 y.o. yo H6E8887 at [redacted]w[redacted]d was admitted to the hospital 08/23/2023 for scheduled cesarean section with the following indication: h .Delivery details are as follows:  Membrane Rupture Time/Date: 9:23 PM,08/23/2023  Delivery Method:C-Section, Low Transverse Operative Delivery:N/A Details of operation can be found in separate operative note.  Patient had a postpartum course complicated by hypertension .  She is ambulating, tolerating a regular diet, passing flatus, and urinating well. Patient is discharged home in stable condition on  08/27/23        Newborn Data: Birth date:08/23/2023 Birth time:9:23 PM Gender:Female Living status:Living Apgars:7 ,8  Weight:2800 g    Magnesium  Sulfate received: Yes: Seizure prophylaxis BMZ received: No Rhophylac:No MMR:No T-DaP:Given prenatally Flu: Yes RSV Vaccine received: No Transfusion:No Immunizations administered: Immunization History  Administered Date(s) Administered   Influenza,inj,Quad PF,6+ Mos 08/10/2018   Tdap 07/29/2018    Physical exam  Vitals:   08/27/23 0332 08/27/23 0352 08/27/23 0500 08/27/23 0800  BP: (!) 165/79 (!) 158/81  138/68  Pulse: 61 61  69  Resp: 18   20  Temp: 97.8 F (36.6 C)   97.9 F (36.6 C)  TempSrc: Oral   Oral  SpO2: 100%   99%   Weight:   85.5 kg   Height:       General: alert, cooperative, and no distress Lochia: appropriate Uterine Fundus: firm Incision: Healing well with no significant drainage DVT Evaluation: No evidence of DVT seen on physical exam. Labs: Lab Results  Component Value Date   WBC 9.4 08/24/2023   HGB 9.6 (L) 08/24/2023   HCT 27.9 (L) 08/24/2023   MCV 90.0 08/24/2023   PLT 176 08/24/2023      Latest Ref Rng & Units 08/23/2023    7:19 PM  CMP  Glucose 70 - 99 mg/dL 77   BUN 6 - 20 mg/dL <5   Creatinine 9.55 - 1.00 mg/dL 9.42   Sodium 864 - 854 mmol/L 137   Potassium 3.5 - 5.1 mmol/L 3.5   Chloride 98 - 111 mmol/L 103   CO2 22 - 32 mmol/L 20   Calcium  8.9 - 10.3 mg/dL 8.9   Total Protein 6.5 - 8.1 g/dL 6.5   Total Bilirubin 0.0 - 1.2 mg/dL 0.5   Alkaline Phos 38 - 126 U/L 129   AST 15 - 41 U/L 30   ALT 0 - 44 U/L 12    Edinburgh Score:    08/27/2023    8:00 AM  Edinburgh Postnatal Depression Scale Screening Tool  I have been able to laugh and see the funny side of things. 1  I have looked forward with enjoyment to things. 0  I have blamed  myself unnecessarily when things went wrong. 3  I have been anxious or worried for no good reason. 2  I have felt scared or panicky for no good reason. 1  Things have been getting on top of me. 2  I have been so unhappy that I have had difficulty sleeping. 2  I have felt sad or miserable. 1  I have been so unhappy that I have been crying. 1  The thought of harming myself has occurred to me. 0  Edinburgh Postnatal Depression Scale Total 13      After visit meds:  Allergies as of 08/27/2023   No Known Allergies      Medication List     STOP taking these medications    docusate sodium  100 MG capsule Commonly known as: Colace   metroNIDAZOLE  500 MG tablet Commonly known as: FLAGYL    ondansetron  4 MG disintegrating tablet Commonly known as: ZOFRAN -ODT       TAKE these medications    escitalopram  10 MG tablet Commonly  known as: LEXAPRO  Take 2 tablets (20 mg total) by mouth daily.   labetalol  300 MG tablet Commonly known as: NORMODYNE  Take 1 tablet (300 mg total) by mouth every 8 (eight) hours. What changed:  medication strength how much to take when to take this   multivitamin-prenatal 27-0.8 MG Tabs tablet Take 1 tablet by mouth daily at 12 noon.   NIFEdipine  30 MG 24 hr tablet Commonly known as: ADALAT  CC Take 1 tablet (30 mg total) by mouth daily.   oxyCODONE  5 MG immediate release tablet Commonly known as: Oxy IR/ROXICODONE  Take 1-2 tablets (5-10 mg total) by mouth every 4 (four) hours as needed for moderate pain (pain score 4-6).         Discharge home in stable condition Infant Feeding: Breast Infant Disposition:home with mother Discharge instruction: per After Visit Summary and Postpartum booklet. Activity: Advance as tolerated. Pelvic rest for 6 weeks.  Diet: routine diet Anticipated Birth Control: Unsure Postpartum Appointment:6 weeks Additional Postpartum F/U: BP check 1 week Future Appointments:No future appointments. Follow up Visit:      08/27/2023 Rosaline LITTIE Cobble, MD

## 2023-08-28 ENCOUNTER — Ambulatory Visit (HOSPITAL_COMMUNITY): Payer: Self-pay

## 2023-08-28 NOTE — Lactation Note (Signed)
 This note was copied from a baby's chart.  NICU Lactation Consultation Note  Patient Name: Olivia Melendez Date: 08/28/2023 Age:41 days  Reason for consult: Follow-up assessment; 1st time breastfeeding; NICU baby; Late-preterm 34-36.6wks; Infant < 6lbs; Breastfeeding assistance; Maternal endocrine disorder (GDM) Type of Endocrine Disorder?: Diabetes  SUBJECTIVE  LC in to visit with P2 Mom of ET infant Olivia Melendez in the NICU.  Baby is currently on an ad lib trial and has been bottle feeding 24 cal formula.  LC came in to assist Mom as she would like for baby to breastfeed.  LC  shared her concerns regarding the engorgement of her breasts, and areola being hard to compress.  Last pumping, Mom used her MomCozy hands free pump and expressed 8 ml.  LC had Mom pre-pump using the hand pump to help evert nipple and soften the areola.  LC placed a 16 mm nipple shield showing Mom how to invert half way and stretch it over the nipple to draw nipple into shield.    Baby placed STS in football hold elevated to height of breast.  Baby Olivia Melendez latched on on the second attempt and latched deeply and sucked with deep jaw extensions and occasional swallow identified.  Mom taught to support baby's head and support her breast and gently compress during sucking to help with milk transfer.  LC instilled slowly the 8 ml of EBM into the nipple shield using the curved tip syringe.  Baby stayed on the breast with nutritive sucking for 12 mins.    Talked about offering a paced bottle if baby is fussy and still cueing after feeding at the breast.  LC shared her concern with Mom's engorged breasts and little milk expressed  Reviewed ice packs on breasts for 20 mins prior to pumping and warm compresses on breast with gentle massage DURING pumping, not before.  After baby breast fed, Mom pumped 45 ml. LC changed the flange size to 18 mm.  Encouraged Mom to room-in and use the Medela Symphony pump as this  was a very importance stage in her milk onset.  OBJECTIVE Infant data: No data recorded O2 Device: Room Air  Infant feeding assessment IDFTS - Readiness: 1 IDFTS - Quality: 2   Maternal data: [id number] C-Section, Low Transverse Pumping frequency: 6 times per 24 hrs Pumped volume: 8 mL (after baby breastfed, Mom pumped 45 ml!!) Flange Size: 18 Hands-free pumping top sizes: Large Alejos)  Pump: Personal, DEBP (lansinoh)  ASSESSMENT Infant: Latch: Grasps breast easily, tongue down, lips flanged, rhythmical sucking. (pre-pumping and using nipple shield) Audible Swallowing: A few with stimulation Type of Nipple: Flat (breasts engorged and areola non-compressible) Comfort (Breast/Nipple): Filling, red/small blisters or bruises, mild/mod discomfort Hold (Positioning): Assistance needed to correctly position infant at breast and maintain latch. LATCH Score: 6  Feeding Status: Ad lib Feeding method: Bottle Nipple Type: Dr. Jonna Fling Preemie  Maternal: Milk volume: Low  INTERVENTIONS/PLAN Interventions: Interventions: Breast feeding basics reviewed; Assisted with latch; Skin to skin; Breast massage; Hand express; Breast compression; Adjust position; Support pillows; Position options; Expressed milk; DEBP; Hand pump; Education; Pace feeding Discharge Education: Engorgement and breast care Tools: Flanges; Pump; Nipple Shields; Hands-free pumping top Pump Education: Setup, frequency, and cleaning; Milk Storage Nipple shield size: 16  Plan: Consult Status: NICU follow-up NICU Follow-up type: Verify absence of engorgement; Verify onset of copious milk   Claudene Aleck BRAVO 08/28/2023, 6:11 PM

## 2023-08-29 ENCOUNTER — Ambulatory Visit (HOSPITAL_COMMUNITY): Payer: Self-pay

## 2023-08-29 NOTE — Lactation Note (Addendum)
 This note was copied from a baby's chart.  NICU Lactation Consultation Note  Patient Name: Olivia Melendez Date: 08/29/2023 Age:41 days  Reason for consult: Early term 37-38.6wks; Maternal endocrine disorder; RN request; 1st time breastfeeding; NICU baby; Engorgement Type of Endocrine Disorder?: Diabetes  SUBJECTIVE LC Mili and LC student Annaya Bangert, visited [redacted]w[redacted]d Olivia Melendez and mother upon RN request. Mother was feeding Olivia Melendez a bottle upon entering room. Mother clarified she is doing well with her hands-free pump at home, and will not need to be issued a pump. LC informed mother about renting options through the hospital and obtaining one through [facility], in case of future use. Mother states she pumps every 3 hours and her engorgement has improved compared to yesterday. LC encouraged the continuous use of ice packs, breast massaging, and consistent pumping every 3 hours. RN states no pumping has been observed since the morning. All questions and concerns were answered and mother acknowledged LC services if needed. OBJECTIVE Infant data: No data recorded O2 Device: Room Air  Infant feeding assessment IDFTS - Readiness: 1 IDFTS - Quality: 2   Maternal data: [id number] C-Section, Low Transverse Pumping frequency: 6 times per 24 hrs Pumped volume: 8 mL (after baby breastfed, Mom pumped 45 ml!!) Flange Size: 18 Hands-free pumping top sizes: Large Olivia Melendez)  Pump: Personal, DEBP (lansinoh)  ASSESSMENT Infant:    Feeding Status: Ad lib Feeding method: Bottle Nipple Type: Dr. Jonna Fling Preemie  Maternal: Milk volume: Low  INTERVENTIONS/PLAN Interventions: Interventions: Breast feeding basics reviewed; Assisted with latch; Skin to skin; Breast massage; Hand express; Breast compression; Adjust position; Support pillows; Position options; Expressed milk; DEBP; Hand pump; Education; Pace feeding Discharge Education: Engorgement and breast care Tools: Flanges; Pump; Nipple Shields;  Hands-free pumping top Pump Education: Setup, frequency, and cleaning; Milk Storage Nipple shield size: 16  Plan: Consult Status: NICU follow-up NICU Follow-up type: Verify absence of engorgement; Verify onset of copious milk   Orlene Nova 08/29/2023, 5:12 PM

## 2023-08-30 ENCOUNTER — Ambulatory Visit (HOSPITAL_COMMUNITY): Payer: Self-pay

## 2023-08-30 NOTE — Lactation Note (Signed)
 This note was copied from a baby's chart.  NICU Lactation Consultation Note  Patient Name: Olivia Melendez [id number] Date: 08/30/2023 Age:43 years  Reason for consult: Weekly NICU follow-up; NICU baby; Early term 37-38.6wks; 1st time breastfeeding; Maternal endocrine disorder; Other (Comment) (AMA, smoker, cHTN) Type of Endocrine Disorder?: Diabetes (GDMA1)  SUBJECTIVE Visited with family of 41 1/77 weeks old AGA NICU female; Olivia Melendez is a P3 and reports that baby Olivia Melendez is doing well, family has been working mostly on bottle feedings, no breastmilk feedings the last 24 hours. Family is taking Pharmacologist home today. Reviewed discharge education and the importance of consistent pumping after feedings/attempts at the breast to protect her supply. She politely declined a referral to [facility] OP as she voiced she'll be following up with baby's pediatrician's office in [place]. No other support person at this time. All questions and concerns answered, family to contact [facility] services PRN.   OBJECTIVE Infant data: Mother's Current Feeding Choice: Breast Milk and Formula  O2 Device: Room Air  Infant feeding assessment IDFTS - Readiness: 2 IDFTS - Quality: 3   Maternal data: [id number] C-Section, Low Transverse No data recorded Pump: Hands Free (Lansinoh DEBP wearable)  ASSESSMENT Infant: Feeding Status: Ad lib Feeding method: Bottle Nipple Type: Dr. Leticia Raven Preemie  INTERVENTIONS/PLAN Interventions: Interventions: Education Discharge Education: Outpatient recommendation  Plan: Consult Status: Complete   [place] Bynum Newman Bare 08/30/2023, 11:39 AM

## 2023-09-04 ENCOUNTER — Encounter (HOSPITAL_COMMUNITY)
Admission: RE | Admit: 2023-09-04 | Discharge: 2023-09-04 | Disposition: A | Discharge status: Home / Self Care | Payer: Managed Care, Other (non HMO) | Source: Ambulatory Visit | Attending: Obstetrics & Gynecology | Admitting: Obstetrics & Gynecology

## 2023-09-04 ENCOUNTER — Telehealth (HOSPITAL_COMMUNITY): Payer: Self-pay | Admitting: *Deleted

## 2023-09-04 NOTE — Telephone Encounter (Signed)
09/04/2023  Name: Olivia Melendez MRN: [id number] DOB: May 18, 1981  Reason for Call:  Transition of Care Hospital Discharge Call  Contact Status: Patient Contact Status: Message  Language assistant needed:          Follow-Up Questions:    Inocente Salles Postnatal Depression Scale:  In the Past 7 Days:    PHQ2-9 Depression Scale:     Discharge Follow-up:    Post-discharge interventions: NA  Salena Saner, RN 09/04/2023 10:19

## 2023-09-06 ENCOUNTER — Inpatient Hospital Stay (HOSPITAL_COMMUNITY)
Admission: AD | Admit: 2023-09-06 | Payer: Managed Care, Other (non HMO) | Source: Home / Self Care | Admitting: Obstetrics & Gynecology

## 2024-01-31 ENCOUNTER — Telehealth: Payer: Self-pay | Admitting: *Deleted

## 2024-01-31 DIAGNOSIS — I10 Essential (primary) hypertension: Secondary | ICD-10-CM

## 2024-01-31 NOTE — Progress Notes (Signed)
 Complex Care Management Note Care Guide Note  01/31/2024 Name: Olivia Melendez MRN: [id number] DOB: [birth year]   Complex Care Management Outreach Attempts: An unsuccessful telephone outreach was attempted today to offer the patient information about available complex care management services.  Follow Up Plan:  Additional outreach attempts will be made to offer the patient complex care management information and services.   Encounter Outcome:  No Answer  Kandis Ormond, CMA [place]  [facility], [facility] Guide Direct Dial: [phone]  Fax: [phone] Website: [email or web address]

## 2024-02-01 NOTE — Progress Notes (Signed)
 Complex Care Management Note Care Guide Note  02/01/2024 Name: Olivia Melendez MRN: [id number] DOB: [birth year]   Complex Care Management Outreach Attempts: A second unsuccessful outreach was attempted today to offer the patient with information about available complex care management services.  Follow Up Plan:  Additional outreach attempts will be made to offer the patient complex care management information and services.   Encounter Outcome:  No Answer  Kandis Ormond, CMA [place]  [facility], [facility] Guide Direct Dial: [phone]  Fax: [phone] Website: [email or web address]

## 2024-02-02 NOTE — Progress Notes (Signed)
 Complex Care Management Note Care Guide Note  02/02/2024 Name: Olivia Melendez MRN: [id number] DOB: Mar 16, 1981   Complex Care Management Outreach Attempts: A third unsuccessful outreach was attempted today to offer the patient with information about available complex care management services.  Follow Up Plan:  No further outreach attempts will be made at this time. We have been unable to contact the patient to offer or enroll patient in complex care management services.  Encounter Outcome:  No Answer  Kandis Ormond, CMA [place]  [facility], [facility] Guide Direct Dial: [phone]  Fax: [phone] Website: [email or web address]

## 2024-05-02 ENCOUNTER — Other Ambulatory Visit: Payer: Self-pay | Admitting: Internal Medicine

## 2024-05-02 DIAGNOSIS — Z1231 Encounter for screening mammogram for malignant neoplasm of breast: Secondary | ICD-10-CM
